# Patient Record
Sex: Female | Born: 1950 | Race: White | Hispanic: No | Smoking: Current some day smoker
Health system: Southern US, Community
[De-identification: ages and names within clinical notes are randomized; demographics above are authoritative.]

## PROBLEM LIST (undated history)

## (undated) DIAGNOSIS — J449 Chronic obstructive pulmonary disease, unspecified: Secondary | ICD-10-CM

## (undated) DIAGNOSIS — I4581 Long QT syndrome: Secondary | ICD-10-CM

## (undated) DIAGNOSIS — I639 Cerebral infarction, unspecified: Secondary | ICD-10-CM

## (undated) DIAGNOSIS — Z720 Tobacco use: Secondary | ICD-10-CM

## (undated) DIAGNOSIS — G43909 Migraine, unspecified, not intractable, without status migrainosus: Secondary | ICD-10-CM

## (undated) DIAGNOSIS — F32A Depression, unspecified: Secondary | ICD-10-CM

## (undated) DIAGNOSIS — F419 Anxiety disorder, unspecified: Secondary | ICD-10-CM

## (undated) DIAGNOSIS — T7840XA Allergy, unspecified, initial encounter: Secondary | ICD-10-CM

## (undated) DIAGNOSIS — I10 Essential (primary) hypertension: Secondary | ICD-10-CM

## (undated) DIAGNOSIS — M1710 Unilateral primary osteoarthritis, unspecified knee: Secondary | ICD-10-CM

## (undated) DIAGNOSIS — F329 Major depressive disorder, single episode, unspecified: Secondary | ICD-10-CM

## (undated) DIAGNOSIS — I2510 Atherosclerotic heart disease of native coronary artery without angina pectoris: Secondary | ICD-10-CM

## (undated) DIAGNOSIS — Z95810 Presence of automatic (implantable) cardiac defibrillator: Secondary | ICD-10-CM

## (undated) DIAGNOSIS — E785 Hyperlipidemia, unspecified: Secondary | ICD-10-CM

## (undated) DIAGNOSIS — G40909 Epilepsy, unspecified, not intractable, without status epilepticus: Secondary | ICD-10-CM

## (undated) DIAGNOSIS — I469 Cardiac arrest, cause unspecified: Secondary | ICD-10-CM

## (undated) DIAGNOSIS — R569 Unspecified convulsions: Secondary | ICD-10-CM

## (undated) DIAGNOSIS — I4891 Unspecified atrial fibrillation: Principal | ICD-10-CM

## (undated) DIAGNOSIS — I773 Arterial fibromuscular dysplasia: Secondary | ICD-10-CM

## (undated) DIAGNOSIS — I4901 Ventricular fibrillation: Secondary | ICD-10-CM

## (undated) DIAGNOSIS — I679 Cerebrovascular disease, unspecified: Secondary | ICD-10-CM

## (undated) HISTORY — DX: Presence of automatic (implantable) cardiac defibrillator: Z95.810

## (undated) HISTORY — DX: Anxiety disorder, unspecified: F41.9

## (undated) HISTORY — DX: Epilepsy, unspecified, not intractable, without status epilepticus: G40.909

## (undated) HISTORY — DX: Hyperlipidemia, unspecified: E78.5

## (undated) HISTORY — DX: Cardiac arrest, cause unspecified: I46.9

## (undated) HISTORY — DX: Chronic obstructive pulmonary disease, unspecified: J44.9

## (undated) HISTORY — DX: Migraine, unspecified, not intractable, without status migrainosus: G43.909

## (undated) HISTORY — PX: TUBAL LIGATION: SHX77

## (undated) HISTORY — DX: Unspecified convulsions: R56.9

## (undated) HISTORY — DX: Unilateral primary osteoarthritis, unspecified knee: M17.10

## (undated) HISTORY — PX: CARDIAC DEFIBRILLATOR PLACEMENT: SHX171

## (undated) HISTORY — DX: Essential (primary) hypertension: I10

## (undated) HISTORY — DX: Long QT syndrome: I45.81

## (undated) HISTORY — DX: Depression, unspecified: F32.A

## (undated) HISTORY — DX: Cerebrovascular disease, unspecified: I67.9

## (undated) HISTORY — PX: CHOLECYSTECTOMY: SHX55

## (undated) HISTORY — DX: Ventricular fibrillation: I49.01

## (undated) HISTORY — PX: APPENDECTOMY: SHX54

## (undated) HISTORY — DX: Atherosclerotic heart disease of native coronary artery without angina pectoris: I25.10

## (undated) HISTORY — DX: Arterial fibromuscular dysplasia: I77.3

## (undated) HISTORY — DX: Cerebral infarction, unspecified: I63.9

## (undated) HISTORY — DX: Tobacco use: Z72.0

## (undated) HISTORY — PX: COSMETIC SURGERY: SHX468

## (undated) HISTORY — DX: Allergy, unspecified, initial encounter: T78.40XA

## (undated) HISTORY — PX: BREAST LUMPECTOMY: SHX2

## (undated) HISTORY — DX: Major depressive disorder, single episode, unspecified: F32.9

---

## 1998-03-16 ENCOUNTER — Emergency Department (HOSPITAL_COMMUNITY): Admission: EM | Admit: 1998-03-16 | Discharge: 1998-03-16 | Payer: Self-pay | Admitting: *Deleted

## 1998-07-11 ENCOUNTER — Emergency Department (HOSPITAL_COMMUNITY): Admission: EM | Admit: 1998-07-11 | Discharge: 1998-07-11 | Payer: Self-pay | Admitting: Emergency Medicine

## 1998-07-11 ENCOUNTER — Encounter: Payer: Self-pay | Admitting: Emergency Medicine

## 1998-07-18 ENCOUNTER — Emergency Department (HOSPITAL_COMMUNITY): Admission: EM | Admit: 1998-07-18 | Discharge: 1998-07-18 | Payer: Self-pay | Admitting: Emergency Medicine

## 1998-07-19 ENCOUNTER — Emergency Department (HOSPITAL_COMMUNITY): Admission: EM | Admit: 1998-07-19 | Discharge: 1998-07-19 | Payer: Self-pay | Admitting: Emergency Medicine

## 1998-08-27 ENCOUNTER — Ambulatory Visit (HOSPITAL_COMMUNITY): Admission: RE | Admit: 1998-08-27 | Discharge: 1998-08-27 | Payer: Self-pay | Admitting: Neurosurgery

## 1998-08-27 ENCOUNTER — Encounter: Payer: Self-pay | Admitting: Neurosurgery

## 1998-09-07 ENCOUNTER — Emergency Department (HOSPITAL_COMMUNITY): Admission: EM | Admit: 1998-09-07 | Discharge: 1998-09-07 | Payer: Self-pay | Admitting: Endocrinology

## 1998-09-07 ENCOUNTER — Emergency Department (HOSPITAL_COMMUNITY): Admission: EM | Admit: 1998-09-07 | Discharge: 1998-09-07 | Payer: Self-pay | Admitting: Emergency Medicine

## 1998-10-29 ENCOUNTER — Emergency Department (HOSPITAL_COMMUNITY): Admission: EM | Admit: 1998-10-29 | Discharge: 1998-10-29 | Payer: Self-pay | Admitting: Emergency Medicine

## 1998-10-29 ENCOUNTER — Encounter: Payer: Self-pay | Admitting: Emergency Medicine

## 1998-11-16 ENCOUNTER — Emergency Department (HOSPITAL_COMMUNITY): Admission: EM | Admit: 1998-11-16 | Discharge: 1998-11-16 | Payer: Self-pay | Admitting: Emergency Medicine

## 1998-11-16 ENCOUNTER — Encounter: Payer: Self-pay | Admitting: Emergency Medicine

## 1999-02-26 ENCOUNTER — Emergency Department (HOSPITAL_COMMUNITY): Admission: EM | Admit: 1999-02-26 | Discharge: 1999-02-26 | Payer: Self-pay | Admitting: Emergency Medicine

## 1999-03-26 ENCOUNTER — Encounter: Payer: Self-pay | Admitting: Emergency Medicine

## 1999-03-26 ENCOUNTER — Emergency Department (HOSPITAL_COMMUNITY): Admission: EM | Admit: 1999-03-26 | Discharge: 1999-03-26 | Payer: Self-pay | Admitting: Emergency Medicine

## 1999-10-15 ENCOUNTER — Emergency Department (HOSPITAL_COMMUNITY): Admission: EM | Admit: 1999-10-15 | Discharge: 1999-10-15 | Payer: Self-pay | Admitting: Emergency Medicine

## 1999-10-15 ENCOUNTER — Encounter: Payer: Self-pay | Admitting: Emergency Medicine

## 1999-12-04 ENCOUNTER — Emergency Department (HOSPITAL_COMMUNITY): Admission: EM | Admit: 1999-12-04 | Discharge: 1999-12-04 | Payer: Self-pay | Admitting: Emergency Medicine

## 1999-12-17 ENCOUNTER — Emergency Department (HOSPITAL_COMMUNITY): Admission: EM | Admit: 1999-12-17 | Discharge: 1999-12-17 | Payer: Self-pay | Admitting: *Deleted

## 1999-12-23 ENCOUNTER — Emergency Department (HOSPITAL_COMMUNITY): Admission: EM | Admit: 1999-12-23 | Discharge: 1999-12-23 | Payer: Self-pay | Admitting: Internal Medicine

## 2000-02-07 ENCOUNTER — Encounter: Payer: Self-pay | Admitting: Emergency Medicine

## 2000-02-07 ENCOUNTER — Emergency Department (HOSPITAL_COMMUNITY): Admission: EM | Admit: 2000-02-07 | Discharge: 2000-02-07 | Payer: Self-pay | Admitting: Emergency Medicine

## 2000-03-28 ENCOUNTER — Emergency Department (HOSPITAL_COMMUNITY): Admission: EM | Admit: 2000-03-28 | Discharge: 2000-03-28 | Payer: Self-pay | Admitting: Emergency Medicine

## 2000-03-28 ENCOUNTER — Encounter: Payer: Self-pay | Admitting: Emergency Medicine

## 2000-05-13 ENCOUNTER — Emergency Department (HOSPITAL_COMMUNITY): Admission: EM | Admit: 2000-05-13 | Discharge: 2000-05-13 | Payer: Self-pay | Admitting: *Deleted

## 2001-01-09 ENCOUNTER — Emergency Department (HOSPITAL_COMMUNITY): Admission: EM | Admit: 2001-01-09 | Discharge: 2001-01-09 | Payer: Self-pay | Admitting: Internal Medicine

## 2001-04-01 ENCOUNTER — Encounter: Payer: Self-pay | Admitting: *Deleted

## 2001-04-02 ENCOUNTER — Inpatient Hospital Stay (HOSPITAL_COMMUNITY): Admission: EM | Admit: 2001-04-02 | Discharge: 2001-04-03 | Payer: Self-pay | Admitting: *Deleted

## 2001-04-05 ENCOUNTER — Emergency Department (HOSPITAL_COMMUNITY): Admission: EM | Admit: 2001-04-05 | Discharge: 2001-04-06 | Payer: Self-pay | Admitting: *Deleted

## 2001-04-06 ENCOUNTER — Encounter: Payer: Self-pay | Admitting: *Deleted

## 2001-09-21 ENCOUNTER — Emergency Department (HOSPITAL_COMMUNITY): Admission: EM | Admit: 2001-09-21 | Discharge: 2001-09-21 | Payer: Self-pay | Admitting: Emergency Medicine

## 2001-09-21 ENCOUNTER — Encounter: Payer: Self-pay | Admitting: Emergency Medicine

## 2001-12-24 ENCOUNTER — Emergency Department (HOSPITAL_COMMUNITY): Admission: EM | Admit: 2001-12-24 | Discharge: 2001-12-24 | Payer: Self-pay | Admitting: Emergency Medicine

## 2002-01-29 ENCOUNTER — Encounter: Payer: Self-pay | Admitting: *Deleted

## 2002-01-29 ENCOUNTER — Emergency Department (HOSPITAL_COMMUNITY): Admission: EM | Admit: 2002-01-29 | Discharge: 2002-01-29 | Payer: Self-pay | Admitting: *Deleted

## 2002-03-19 ENCOUNTER — Emergency Department (HOSPITAL_COMMUNITY): Admission: EM | Admit: 2002-03-19 | Discharge: 2002-03-19 | Payer: Self-pay | Admitting: Emergency Medicine

## 2002-07-24 ENCOUNTER — Emergency Department (HOSPITAL_COMMUNITY): Admission: EM | Admit: 2002-07-24 | Discharge: 2002-07-24 | Payer: Self-pay | Admitting: Emergency Medicine

## 2003-02-19 ENCOUNTER — Emergency Department (HOSPITAL_COMMUNITY): Admission: EM | Admit: 2003-02-19 | Discharge: 2003-02-19 | Payer: Self-pay | Admitting: Emergency Medicine

## 2003-02-19 ENCOUNTER — Encounter: Payer: Self-pay | Admitting: Emergency Medicine

## 2003-04-24 ENCOUNTER — Inpatient Hospital Stay (HOSPITAL_COMMUNITY): Admission: EM | Admit: 2003-04-24 | Discharge: 2003-05-02 | Payer: Self-pay | Admitting: Emergency Medicine

## 2003-04-24 ENCOUNTER — Encounter: Payer: Self-pay | Admitting: Emergency Medicine

## 2003-04-25 ENCOUNTER — Encounter (INDEPENDENT_AMBULATORY_CARE_PROVIDER_SITE_OTHER): Payer: Self-pay | Admitting: Cardiology

## 2003-04-25 ENCOUNTER — Encounter: Payer: Self-pay | Admitting: Neurology

## 2003-04-26 ENCOUNTER — Encounter: Payer: Self-pay | Admitting: Neurology

## 2003-05-01 ENCOUNTER — Encounter: Payer: Self-pay | Admitting: Internal Medicine

## 2003-05-01 HISTORY — PX: PACEMAKER INSERTION: SHX728

## 2003-05-02 ENCOUNTER — Encounter: Payer: Self-pay | Admitting: Neurology

## 2003-06-22 ENCOUNTER — Ambulatory Visit (HOSPITAL_COMMUNITY): Admission: RE | Admit: 2003-06-22 | Discharge: 2003-06-22 | Payer: Self-pay | Admitting: Neurology

## 2003-06-29 ENCOUNTER — Emergency Department (HOSPITAL_COMMUNITY): Admission: AD | Admit: 2003-06-29 | Discharge: 2003-06-29 | Payer: Self-pay | Admitting: Family Medicine

## 2003-07-04 ENCOUNTER — Emergency Department (HOSPITAL_COMMUNITY): Admission: AD | Admit: 2003-07-04 | Discharge: 2003-07-04 | Payer: Self-pay | Admitting: Family Medicine

## 2003-07-06 ENCOUNTER — Encounter: Payer: Self-pay | Admitting: Internal Medicine

## 2003-07-06 ENCOUNTER — Inpatient Hospital Stay (HOSPITAL_COMMUNITY): Admission: EM | Admit: 2003-07-06 | Discharge: 2003-07-07 | Payer: Self-pay | Admitting: Emergency Medicine

## 2003-07-07 ENCOUNTER — Encounter: Payer: Self-pay | Admitting: Internal Medicine

## 2003-07-11 ENCOUNTER — Emergency Department (HOSPITAL_COMMUNITY): Admission: EM | Admit: 2003-07-11 | Discharge: 2003-07-11 | Payer: Self-pay | Admitting: Emergency Medicine

## 2003-09-25 ENCOUNTER — Emergency Department (HOSPITAL_COMMUNITY): Admission: AD | Admit: 2003-09-25 | Discharge: 2003-09-25 | Payer: Self-pay | Admitting: Family Medicine

## 2004-02-19 ENCOUNTER — Emergency Department (HOSPITAL_COMMUNITY): Admission: EM | Admit: 2004-02-19 | Discharge: 2004-02-19 | Payer: Self-pay | Admitting: Emergency Medicine

## 2004-11-10 ENCOUNTER — Emergency Department (HOSPITAL_COMMUNITY): Admission: EM | Admit: 2004-11-10 | Discharge: 2004-11-10 | Payer: Self-pay | Admitting: Emergency Medicine

## 2005-07-12 ENCOUNTER — Emergency Department (HOSPITAL_COMMUNITY): Admission: EM | Admit: 2005-07-12 | Discharge: 2005-07-12 | Payer: Self-pay | Admitting: Emergency Medicine

## 2005-07-13 ENCOUNTER — Inpatient Hospital Stay (HOSPITAL_COMMUNITY): Admission: EM | Admit: 2005-07-13 | Discharge: 2005-07-14 | Payer: Self-pay | Admitting: Emergency Medicine

## 2005-11-08 ENCOUNTER — Emergency Department (HOSPITAL_COMMUNITY): Admission: EM | Admit: 2005-11-08 | Discharge: 2005-11-09 | Payer: Self-pay | Admitting: Emergency Medicine

## 2005-12-08 ENCOUNTER — Emergency Department (HOSPITAL_COMMUNITY): Admission: EM | Admit: 2005-12-08 | Discharge: 2005-12-08 | Payer: Self-pay | Admitting: Emergency Medicine

## 2005-12-25 ENCOUNTER — Emergency Department (HOSPITAL_COMMUNITY): Admission: EM | Admit: 2005-12-25 | Discharge: 2005-12-26 | Payer: Self-pay | Admitting: Emergency Medicine

## 2006-01-19 ENCOUNTER — Emergency Department (HOSPITAL_COMMUNITY): Admission: EM | Admit: 2006-01-19 | Discharge: 2006-01-19 | Payer: Self-pay | Admitting: Emergency Medicine

## 2006-04-05 ENCOUNTER — Emergency Department (HOSPITAL_COMMUNITY): Admission: EM | Admit: 2006-04-05 | Discharge: 2006-04-05 | Payer: Self-pay | Admitting: Emergency Medicine

## 2006-04-24 ENCOUNTER — Observation Stay (HOSPITAL_COMMUNITY): Admission: EM | Admit: 2006-04-24 | Discharge: 2006-04-25 | Payer: Self-pay | Admitting: Emergency Medicine

## 2006-04-24 ENCOUNTER — Ambulatory Visit: Payer: Self-pay | Admitting: *Deleted

## 2006-05-25 ENCOUNTER — Emergency Department (HOSPITAL_COMMUNITY): Admission: EM | Admit: 2006-05-25 | Discharge: 2006-05-25 | Payer: Self-pay | Admitting: Emergency Medicine

## 2006-06-07 ENCOUNTER — Emergency Department (HOSPITAL_COMMUNITY): Admission: EM | Admit: 2006-06-07 | Discharge: 2006-06-07 | Payer: Self-pay | Admitting: Emergency Medicine

## 2006-06-21 ENCOUNTER — Emergency Department (HOSPITAL_COMMUNITY): Admission: EM | Admit: 2006-06-21 | Discharge: 2006-06-22 | Payer: Self-pay | Admitting: Emergency Medicine

## 2006-09-10 ENCOUNTER — Ambulatory Visit (HOSPITAL_COMMUNITY): Admission: RE | Admit: 2006-09-10 | Discharge: 2006-09-10 | Payer: Self-pay | Admitting: Family Medicine

## 2006-11-24 ENCOUNTER — Emergency Department (HOSPITAL_COMMUNITY): Admission: EM | Admit: 2006-11-24 | Discharge: 2006-11-24 | Payer: Self-pay | Admitting: Family Medicine

## 2006-12-22 ENCOUNTER — Emergency Department (HOSPITAL_COMMUNITY): Admission: EM | Admit: 2006-12-22 | Discharge: 2006-12-23 | Payer: Self-pay | Admitting: Emergency Medicine

## 2007-09-30 ENCOUNTER — Ambulatory Visit: Payer: Self-pay | Admitting: Internal Medicine

## 2008-07-07 ENCOUNTER — Emergency Department (HOSPITAL_COMMUNITY): Admission: EM | Admit: 2008-07-07 | Discharge: 2008-07-07 | Payer: Self-pay | Admitting: Emergency Medicine

## 2008-09-27 ENCOUNTER — Ambulatory Visit: Payer: Self-pay | Admitting: Internal Medicine

## 2008-09-27 LAB — CONVERTED CEMR LAB
Cholesterol: 193 mg/dL (ref 0–200)
HDL: 35.3 mg/dL — ABNORMAL LOW (ref >39.0)
Triglycerides: 304 mg/dL (ref 0–149)

## 2008-11-30 ENCOUNTER — Encounter: Payer: Self-pay | Admitting: Internal Medicine

## 2009-01-25 ENCOUNTER — Encounter: Payer: Self-pay | Admitting: Internal Medicine

## 2009-05-22 DIAGNOSIS — Z95810 Presence of automatic (implantable) cardiac defibrillator: Secondary | ICD-10-CM | POA: Insufficient documentation

## 2009-05-22 DIAGNOSIS — I679 Cerebrovascular disease, unspecified: Secondary | ICD-10-CM | POA: Insufficient documentation

## 2009-05-22 DIAGNOSIS — J449 Chronic obstructive pulmonary disease, unspecified: Secondary | ICD-10-CM

## 2009-05-22 DIAGNOSIS — F17200 Nicotine dependence, unspecified, uncomplicated: Secondary | ICD-10-CM | POA: Insufficient documentation

## 2009-05-22 DIAGNOSIS — F411 Generalized anxiety disorder: Secondary | ICD-10-CM

## 2009-05-22 DIAGNOSIS — R569 Unspecified convulsions: Secondary | ICD-10-CM

## 2009-05-22 DIAGNOSIS — G43909 Migraine, unspecified, not intractable, without status migrainosus: Secondary | ICD-10-CM | POA: Insufficient documentation

## 2009-05-22 DIAGNOSIS — I2510 Atherosclerotic heart disease of native coronary artery without angina pectoris: Secondary | ICD-10-CM | POA: Insufficient documentation

## 2009-05-22 DIAGNOSIS — J4489 Other specified chronic obstructive pulmonary disease: Secondary | ICD-10-CM | POA: Insufficient documentation

## 2009-05-22 DIAGNOSIS — I469 Cardiac arrest, cause unspecified: Secondary | ICD-10-CM | POA: Insufficient documentation

## 2009-05-22 DIAGNOSIS — I4581 Long QT syndrome: Secondary | ICD-10-CM

## 2009-05-22 DIAGNOSIS — M179 Osteoarthritis of knee, unspecified: Secondary | ICD-10-CM | POA: Insufficient documentation

## 2009-05-22 DIAGNOSIS — M1710 Unilateral primary osteoarthritis, unspecified knee: Secondary | ICD-10-CM

## 2009-05-22 DIAGNOSIS — F329 Major depressive disorder, single episode, unspecified: Secondary | ICD-10-CM

## 2009-06-26 ENCOUNTER — Encounter (INDEPENDENT_AMBULATORY_CARE_PROVIDER_SITE_OTHER): Payer: Self-pay | Admitting: *Deleted

## 2009-07-13 ENCOUNTER — Encounter: Payer: Self-pay | Admitting: Internal Medicine

## 2009-07-13 ENCOUNTER — Telehealth: Payer: Self-pay | Admitting: Internal Medicine

## 2009-07-16 ENCOUNTER — Telehealth: Payer: Self-pay | Admitting: Internal Medicine

## 2010-02-20 ENCOUNTER — Ambulatory Visit: Payer: Self-pay | Admitting: Internal Medicine

## 2010-02-21 ENCOUNTER — Encounter: Payer: Self-pay | Admitting: Internal Medicine

## 2010-03-19 ENCOUNTER — Emergency Department (HOSPITAL_COMMUNITY): Admission: EM | Admit: 2010-03-19 | Discharge: 2010-03-20 | Payer: Self-pay | Admitting: Emergency Medicine

## 2010-05-09 ENCOUNTER — Telehealth (INDEPENDENT_AMBULATORY_CARE_PROVIDER_SITE_OTHER): Payer: Self-pay | Admitting: *Deleted

## 2010-06-26 ENCOUNTER — Ambulatory Visit: Payer: Self-pay | Admitting: Internal Medicine

## 2010-06-27 ENCOUNTER — Encounter: Payer: Self-pay | Admitting: Internal Medicine

## 2010-08-13 ENCOUNTER — Telehealth (INDEPENDENT_AMBULATORY_CARE_PROVIDER_SITE_OTHER): Payer: Self-pay

## 2010-09-04 ENCOUNTER — Telehealth (INDEPENDENT_AMBULATORY_CARE_PROVIDER_SITE_OTHER): Payer: Self-pay | Admitting: *Deleted

## 2010-10-13 ENCOUNTER — Encounter: Payer: Self-pay | Admitting: Obstetrics and Gynecology

## 2010-10-24 NOTE — Progress Notes (Signed)
Summary: Question concerning lightheadedness   Phone Note Call from Patient Call back at Home Phone [phone]   Caller: Patient Reason for Call: Talk to Doctor Summary of Call: I returned a call from the patient concerning lightheadedness and palpitations.  She states that when she stands up she becomes lightheaded and has actually passed out one time recently.  She has an ICD from previous cardiac arrest, this has not shocked her and has been interrogated in the recent past and is not at end of life.  She denies any prodromal symptoms and does not have any lost of bowels, or altered consciousness following the episode.  She does say that once she stands for a moment she feels better.  She states that she is feeling fine otherwise, no fever/chills or CP/SOB.  She does have occasional palpitations, that are unchanged from prior history.  Currently she says her heart is not beating fast.  I have told the patient she may try halving her metroprolol dose and see if this helps.  I have also instructed her to sit on the edge of the chair for a minute prior to standing and then again wait before starting to walk.  She voiced understanding.  She says she has an appt with [facility] in 2 weeks and will be sure to keep this appt.  If she continues to have these symptoms or they worsen she has been instructed to go to the emergency department, she voiced understanding.  The patient appreciated the call back. Initial call taken by: Robbi Garter NP-PA,  May 09, 2010 7:14 PM

## 2010-10-24 NOTE — Letter (Signed)
Summary: Operative Report, ICD Follow Up, ICD Implant Data Form/Warranty   Operative Report, ICD Follow Up, ICD Implant Data Form/Warranty 2004   Imported By: Roderic Ovens 07/25/2010 12:18:56  _____________________________________________________________________  External Attachment:    Type:   Image     Comment:   External Document

## 2010-10-24 NOTE — Assessment & Plan Note (Signed)
Summary: 49month/rm  Medications Added VALIUM 5 MG TABS (DIAZEPAM) take 1/2 tab qid        Visit Type:  Follow-up Primary Provider:  no primary m.d  CC:  no cardiology complaints.  History of Present Illness: [name] returns today for followup.  She is a very pleasant middle-aged woman with a history of VF arrest status post ICD insertion.  She has a history of VT.  She has a history of tachy palpitations, who returns today for followup.  She has had some palpitations over the past few months and has documented atrial fibrillation.  She denies c/p or syncope or any ICD shocks.  She has been placed on ASA.  Current Medications (verified): 1)  Metoprolol Succinate 100 Mg Xr24h-Tab (Metoprolol Succinate) .... Once Daily 2)  Simvastatin 20 Mg Tabs (Simvastatin) .... Take One Tablet By Mouth Daily At Bedtime 3)  Paxil 30 Mg Tabs (Paroxetine Hcl) .... 2 By Mouth Once Daily 4)  Valium 5 Mg Tabs (Diazepam) .... Take 1/2 Tab Qid  Allergies: 1)  ! Codeine 2)  ! * Myacin 3)  ! 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 Ladona Ridgel, M.D.    Review of Systems  The patient denies chest pain, syncope, dyspnea on exertion, and  peripheral edema.    Vital Signs:  Patient profile:   60 year old female Weight:      191 pounds Pulse rate:   77 / minute BP sitting:   134 / 83  (right arm)  Vitals Entered By: Dreama Saa, CNA (June 26, 2010 2:40 PM)  Physical Exam  General:  Middle aged well developed, well nourished, in no acute distress.  HEENT: normal Neck: supple. No JVD. Carotids 2+ bilaterally no bruits Cor: RRR no rubs, gallops or murmur Lungs: CTA. well healed ICD incision. Ab: soft, nontender. nondistended. No HSM. Good bowel sounds Ext: warm. no cyanosis, clubbing or edema Neuro: alert and oriented. Grossly nonfocal. affect pleasant     ICD Specifications Following MD:  Lewayne Bunting, MD     ICD Vendor:  [employer] [facility] [facility] [employer]     ICD Model Number:  [phone]     ICD Serial Number:  [id number] ICD DOI:  05/01/2003     ICD Implanting MD:  Lewayne Bunting, MD  Lead 1:    Location: RV     DOI: 05/01/2003     Model #: [id number]     Serial #: [id number]     Status: active  Indications::  LONG QT SYNDROME   ICD Follow Up Remote Check?  No Battery Voltage:  2.58 V     Charge Time:  15.1 seconds     Battery Est. Longevity:  MOL2 Underlying rhythm:  SR ICD Dependent:  No       ICD Device Measurements Right Ventricle:  Amplitude: 13.1 mV, Impedance: 995 ohms, Threshold: 0.8 V at 0.5 msec Shock Impedance: 49  ohms   Episodes Coumadin:  No Shock:  0     ATP:  0     Nonsustained:  1     Ventricular Pacing:  0%  Brady Parameters Mode VVI     Lower Rate Limit:  40      Tachy Zones VF:  210     VT:  170     Next Cardiology Appt Due:  09/22/2010 Tech Comments:  No parameter changes.  Device function normal.  C/O episode in June where she was seen in the ED for rapid heart rate.  The device showed one NSVT episode 14 seconds.  ROV 3 months [facility] clinic. Altha Harm, LPN  June 26, 2010 3:01 PM  MD Comments:  Agree with above.  Impression & Recommendations:  Problem # 1:  AUTOMATIC IMPLANTABLE CARDIAC DEFIBRILLATOR  SITU (ICD-V45.02) Her device is working normally.  Will recheck her device in several months.  Problem # 2:  CAD (ICD-414.00) She denies anginal symptoms.  Continue meds as below. Her updated medication list for this problem includes:    Metoprolol Succinate 100 Mg Xr24h-tab (Metoprolol succinate) ..... 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  She has been stable. Her updated medication list for this problem includes:    Metoprolol Succinate 100 Mg Xr24h-tab (Metoprolol succinate) ..... Once daily  Patient Instructions: 1)  Your physician recommends that you schedule a follow-up appointment in: 1 year

## 2010-10-24 NOTE — Assessment & Plan Note (Signed)
Summary: ROV  Medications Added SIMVASTATIN 20 MG TABS (SIMVASTATIN) Take one tablet by mouth daily at bedtime VALIUM 5 MG TABS (DIAZEPAM) take 1/2 tab as needed      Allergies Added: ! CODEINE ! Juleen China ! DARVOCET  Visit Type:  Follow-up Primary Provider:  no primary m.d   History of Present Illness: [name] returns today for followup.  She is a very pleasant middle-aged woman with a history of VF arrest status post ICD insertion.  She has a history of VT.  She has a history of tachy palpitations, who returns today for followup.  The patient has a host of different complaints today including problems with wanted to stop smoking, but been unable; problems with occasional headaches, and problems with fatigue. She has also had worsening arthritic complaints and is considering knee surgery.  She denies c/p or syncope or any ICD shocks.  Current Medications (verified): 1)  Metoprolol Succinate 100 Mg Xr24h-Tab (Metoprolol Succinate) .... Once Daily 2)  Simvastatin 20 Mg Tabs (Simvastatin) .... Take One Tablet By Mouth Daily At Bedtime 3)  Xanax 0.5 Mg Tabs (Alprazolam) .Marland Kitchen.. 1 By Mouth Q4 To  6 As Needed 4)  Paxil 30 Mg Tabs (Paroxetine Hcl) .... 2 By Mouth Once Daily 5)  Valium 5 Mg Tabs (Diazepam) .... Take 1/2 Tab As Needed  Allergies (verified): 1)  ! Codeine 2)  ! * Myacin 3)  ! 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 Ladona Ridgel,  M.D.    Review of Systems  The patient denies chest pain, syncope, dyspnea on exertion, and peripheral edema.    Vital Signs:  Patient profile:   61 year old female Height:      64 inches Weight:      194 pounds BMI:     33.42 Pulse rate:   79 / minute BP sitting:   140 / 87  (right arm)  Vitals Entered By: Dreama Saa, CNA (February 20, 2010 4:34 PM)  Physical Exam  General:  Middle aged well developed, well nourished, in no acute distress.  HEENT: normal Neck: supple. No JVD. Carotids 2+ bilaterally no bruits Cor: RRR no rubs, gallops or murmur Lungs: CTA. well healed ICD incision. Ab: soft, nontender. nondistended. No HSM. Good bowel sounds Ext: warm. no cyanosis, clubbing or edema Neuro: alert and oriented. Grossly nonfocal. affect pleasant     ICD Specifications Following MD:  Lewayne Bunting, MD     ICD Vendor:  [facility] [employer]     ICD Model Number:  [id number]     ICD Serial Number:  [id number] ICD DOI:  05/01/2003     ICD Implanting MD:  Lewayne Bunting, MD  Lead 1:    Location: RV     DOI: 05/01/2003     Model #: [id number]     Serial #: [id number]     Status: active  Indications::  LONG QT SYNDROME   ICD Follow Up Remote Check?  No Battery Voltage:  2.58 V     Charge Time:  14.8 seconds     Battery Est. Longevity:  MOL2 Underlying rhythm:  SR ICD Dependent:  No       ICD Device Measurements Right Ventricle:  Amplitude: 14.9 mV, Impedance: 1052 ohms, Threshold: 0.8 V at 0.5 msec Shock Impedance: 49 ohms   Episodes Shock:  0     ATP:  0     Nonsustained:  0     Ventricular Pacing:  0  Brady Parameters Mode VVI     Lower Rate Limit:  40      Tachy Zones VF:  210     VT:  170     Next Cardiology Appt Due:  05/23/2010 Tech Comments:  No parameter changes.  Device function normal.  No Latitude @ this time.  ROV 3 months [facility] clinic. Altha Harm, LPN  February 20, 453 4:43 PM n  Impression & Recommendations:  Problem # 1:  AUTOMATIC IMPLANTABLE CARDIAC DEFIBRILLATOR SITU  (ICD-V45.02) Her device is working normally and has still not reached ERI.  Will recheck in several months.  Problem # 2:  TOBACCO ABUSE (ICD-305.1) I discussed the importance of stopping smoking and she states that she is going to try.  Problem # 3:  LONG QT SYNDROME (ICD-426.82) She has had no recurrent symptoms or any intercurrent ICD therapies. Her updated medication list for this problem includes:    Metoprolol Succinate 100 Mg Xr24h-tab (Metoprolol succinate) ..... Once daily  Problem # 4:  COPD (ICD-496) This is a recent diagnosis and she is stable on  bronchodilators. Prescriptions: SIMVASTATIN 20 MG TABS (SIMVASTATIN) Take one tablet by mouth daily at bedtime  #90 x 1   Entered by:   Larita Fife Via LPN   Authorized by:   Laren Boom, MD, [facility]   Signed by:   Larita Fife Via LPN on 09/81/1914   Method used:   Electronically to        [facility] (retail)       [address]       [place], [place]  [address]       Ph: [phone]       Fax: [phone]   RxID:   [id number]

## 2010-10-24 NOTE — Progress Notes (Signed)
Summary: Refill   Phone Note Call from Patient   Caller: Patient Reason for Call: Refill Medication Summary of Call: patient states that she is taking Valium for Tachycardia / states that she can not get in touch with [facility] so she wants to know if we can call it in / tg Initial call taken by: Raechel Ache [facility],  August 13, 2010 3:43 PM  Follow-up for Phone Call        Request denied. Pt. advised to f/u with [facility]. Follow-up by: Larita Fife Via LPN,  August 13, 2010 5:05 PM

## 2010-10-24 NOTE — Progress Notes (Signed)
Summary: pt would like [place] to fill her rx for valium   Phone Note Call from Patient Call back at Home Phone [phone]   Caller: pt Reason for Call: Talk to Nurse Summary of Call: pt is calling us to see if we can call in valium for her this one time cannot get her psychiaprist to fill for her because she has not seen him in a long time and they will not fill till she comes in to be seen. Initial call taken by: Faythe Ghee,  September 04, 2010 3:01 PM  Follow-up for Phone Call        [facility] we will not fill this med Follow-up by: Teressa Lower RN,  September 04, 2010 4:55 PM

## 2010-10-24 NOTE — Cardiovascular Report (Signed)
Summary: [place] ICD Follow-Up Summary   [place] ICD Follow-Up Summary   Imported By: Roderic Ovens 07/25/2010 12:00:31  _____________________________________________________________________  External Attachment:    Type:   Image     Comment:   External Document

## 2010-12-09 LAB — URINALYSIS, ROUTINE W REFLEX MICROSCOPIC
Bilirubin Urine: NEGATIVE
Nitrite: NEGATIVE
Protein, ur: NEGATIVE mg/dL
Specific Gravity, Urine: 1.006 (ref 1.005–1.030)
Urobilinogen, UA: 0.2 mg/dL (ref 0.0–1.0)

## 2010-12-09 LAB — CBC
MCHC: 34.1 g/dL (ref 30.0–36.0)
Platelets: 228 10*3/uL (ref 150–400)
RDW: 13.2 % (ref 11.5–15.5)

## 2010-12-09 LAB — URINE MICROSCOPIC-ADD ON

## 2010-12-09 LAB — DIFFERENTIAL
Basophils Absolute: 0 10*3/uL (ref 0.0–0.1)
Eosinophils Absolute: 0.3 10*3/uL (ref 0.0–0.7)
Lymphocytes Relative: 26 % (ref 12–46)
Lymphs Abs: 2.6 10*3/uL (ref 0.7–4.0)
Neutrophils Relative %: 63 % (ref 43–77)

## 2010-12-09 LAB — POCT CARDIAC MARKERS
CKMB, poc: <1 ng/mL — ABNORMAL LOW (ref 1.0–8.0)
CKMB, poc: <1 ng/mL — ABNORMAL LOW (ref 1.0–8.0)
Troponin i, poc: <0.05 ng/mL (ref 0.00–0.09)
Troponin i, poc: <0.05 ng/mL (ref 0.00–0.09)

## 2010-12-09 LAB — COMPREHENSIVE METABOLIC PANEL
Albumin: 3.5 g/dL (ref 3.5–5.2)
BUN: 9 mg/dL (ref 6–23)
Calcium: 9.6 mg/dL (ref 8.4–10.5)
Creatinine, Ser: 1.35 mg/dL — ABNORMAL HIGH (ref 0.4–1.2)
Potassium: 3.3 mEq/L — ABNORMAL LOW (ref 3.5–5.1)
Total Protein: 6.6 g/dL (ref 6.0–8.3)

## 2011-01-16 ENCOUNTER — Emergency Department (HOSPITAL_COMMUNITY)
Admission: EM | Admit: 2011-01-16 | Discharge: 2011-01-16 | Disposition: A | Discharge status: Home / Self Care | Payer: Medicaid Other | Attending: Emergency Medicine | Admitting: Emergency Medicine

## 2011-01-16 ENCOUNTER — Emergency Department (HOSPITAL_COMMUNITY): Payer: Medicaid Other

## 2011-01-16 DIAGNOSIS — J069 Acute upper respiratory infection, unspecified: Secondary | ICD-10-CM | POA: Insufficient documentation

## 2011-01-16 DIAGNOSIS — I10 Essential (primary) hypertension: Secondary | ICD-10-CM | POA: Insufficient documentation

## 2011-01-16 DIAGNOSIS — I4891 Unspecified atrial fibrillation: Secondary | ICD-10-CM | POA: Insufficient documentation

## 2011-01-16 DIAGNOSIS — R0609 Other forms of dyspnea: Secondary | ICD-10-CM | POA: Insufficient documentation

## 2011-01-16 DIAGNOSIS — R0989 Other specified symptoms and signs involving the circulatory and respiratory systems: Secondary | ICD-10-CM | POA: Insufficient documentation

## 2011-01-16 DIAGNOSIS — Z95810 Presence of automatic (implantable) cardiac defibrillator: Secondary | ICD-10-CM | POA: Insufficient documentation

## 2011-01-16 DIAGNOSIS — R05 Cough: Secondary | ICD-10-CM | POA: Insufficient documentation

## 2011-01-16 DIAGNOSIS — R059 Cough, unspecified: Secondary | ICD-10-CM | POA: Insufficient documentation

## 2011-01-16 LAB — CBC
Hemoglobin: 13.9 g/dL (ref 12.0–15.0)
MCH: 32.5 pg (ref 26.0–34.0)
MCHC: 34.9 g/dL (ref 30.0–36.0)
MCV: 93 fL (ref 78.0–100.0)

## 2011-01-16 LAB — POCT I-STAT, CHEM 8
Calcium, Ion: 1.06 mmol/L — ABNORMAL LOW (ref 1.12–1.32)
Creatinine, Ser: 1.2 mg/dL (ref 0.4–1.2)
Glucose, Bld: 111 mg/dL — ABNORMAL HIGH (ref 70–99)
HCT: 41 % (ref 36.0–46.0)
Hemoglobin: 13.9 g/dL (ref 12.0–15.0)
Potassium: 3.5 mEq/L (ref 3.5–5.1)
Sodium: 138 mEq/L (ref 135–145)

## 2011-01-16 LAB — POCT CARDIAC MARKERS: Troponin i, poc: <0.05 ng/mL (ref 0.00–0.09)

## 2011-01-16 LAB — DIFFERENTIAL
Basophils Relative: 1 % (ref 0–1)
Lymphs Abs: 1.7 10*3/uL (ref 0.7–4.0)
Monocytes Absolute: 0.7 10*3/uL (ref 0.1–1.0)
Monocytes Relative: 8 % (ref 3–12)
Neutro Abs: 5.5 10*3/uL (ref 1.7–7.7)

## 2011-01-16 LAB — PROTIME-INR: Prothrombin Time: 13.8 seconds (ref 11.6–15.2)

## 2011-01-18 ENCOUNTER — Telehealth: Payer: Self-pay | Admitting: Physician Assistant

## 2011-01-18 NOTE — Telephone Encounter (Signed)
[name] was in ER last PM with URI Sx and heart out of rhythm. HR was 130 by ER records on admit but SBP dropped 80s, ECG not available. Pt rec'd prednisone 60mg  in ER and has a prescription but has not picked it up. She took Toprol-XL 100mg  1/2 tab this pm (usually takes at bedtime) and HR now approx 80.

## 2011-01-20 ENCOUNTER — Telehealth: Payer: Self-pay

## 2011-01-20 NOTE — Telephone Encounter (Signed)
Per [name] pt needs call re a-fib treatment -pls call

## 2011-01-21 ENCOUNTER — Telehealth: Payer: Self-pay

## 2011-01-27 ENCOUNTER — Encounter: Payer: Medicaid Other | Admitting: *Deleted

## 2011-02-03 ENCOUNTER — Ambulatory Visit (INDEPENDENT_AMBULATORY_CARE_PROVIDER_SITE_OTHER): Payer: Medicaid Other | Admitting: Adult Health

## 2011-02-03 ENCOUNTER — Encounter: Payer: Self-pay | Admitting: Adult Health

## 2011-02-03 DIAGNOSIS — F411 Generalized anxiety disorder: Secondary | ICD-10-CM

## 2011-02-03 DIAGNOSIS — Z95810 Presence of automatic (implantable) cardiac defibrillator: Secondary | ICD-10-CM

## 2011-02-03 DIAGNOSIS — I2510 Atherosclerotic heart disease of native coronary artery without angina pectoris: Secondary | ICD-10-CM

## 2011-02-03 DIAGNOSIS — J449 Chronic obstructive pulmonary disease, unspecified: Secondary | ICD-10-CM

## 2011-02-03 MED ORDER — DIAZEPAM 10 MG PO TABS: 10.0000 mg | ORAL_TABLET | ORAL | Status: DC | PRN

## 2011-02-03 NOTE — Patient Instructions (Signed)
Your physician recommends that you continue on your current medications as directed. Please refer to the Current Medication list given to you today.   Your physician recommends that you schedule a follow-up appointment in: 2 months at the [facility]

## 2011-02-04 ENCOUNTER — Encounter: Payer: Self-pay | Admitting: Adult Health

## 2011-02-04 NOTE — Assessment & Plan Note (Signed)
[facility]                         ELECTROPHYSIOLOGY OFFICE NOTE   NAME:[name]                    MRN:          [id number]  DATE:09/27/2008                            DOB:          1950-12-18    [name] returns today for followup.  She is a very pleasant middle-  aged woman with a history of VF arrest status post ICD insertion.  She  has a history of VT.  She has a history of tachy palpitations, who  returns today for followup.  The patient has a host of different  complaints today including problems with wanted to stop smoking, but  been unable; problems with occasional headaches, and problems with  fatigue.   MEDICATIONS:  Today include;  1. Zocor 20 a day.  2. Toprol-XL 100 a day.  3. She is also on Paxil 40 a day.   PHYSICAL EXAMINATION:  GENERAL:  She is a pleasant middle-aged woman in  no distress.  VITAL SIGNS:  Blood pressure was 160/100, the pulse was 64 and regular,  respirations were 18, and the weight was 193 pounds.  HEENT:  Normocephalic and atraumatic.  Pupils are equal and round.  Oropharynx  is moist.  Sclerae anicteric.  NECK:  No jugular venous distention.  LUNGS:  Clear bilaterally to auscultation.  No wheezes, rales, or  rhonchi are present.  No increased work of breathing.  CARDIOVASCULAR:  Regular rate and rhythm.  Normal S1 and S2.  ABDOMEN:  Soft, nontender, and nondistended.  No organomegaly.  EXTREMITIES:  No edema.   IMPRESSION:  1. History of ventricular fibrillation arrest.  2. Ventricular tachycardia.  3. Ongoing tobacco abuse.  4. Hypertension.   DISCUSSION:  The patient is stable at the present time.  I have  discussed the importance of stopping smoking.  I have asked her to  follow back up with Dr. [name], who is her primary doctor, and  perhaps be started on Chantix.  I will plan to see the patient back in  the office in 1 year.   Her defibrillator today checks out normally.  Her  Guidant Prizm device  has been in place 6-1/2 years.  R waves were 15.  The threshold 0.8 at  0.5.  The battery voltage was 2.61 ( MOL2) with no intercurrent ICD  therapies.     [name]. Brittney Ridgel, MD  Electronically Signed    GWT/MedQ  DD: 09/27/2008  DT: 09/28/2008  Job #: [id number]   cc:   [name], M.D.

## 2011-02-04 NOTE — Assessment & Plan Note (Signed)
She was treated in ER on 01/16/2011 for Dyspnea and this was relieved with Xopenex treatment.  She was in Afib RVR and pacemaker was interrogated finding it to be fx appropriately. No report is available to review.  Since that time she has been doing well.  She feels that the symptoms were related to stress and anxiety.  ER records confirm no MI, PE, or CHF.  No changes at this time in medications.

## 2011-02-04 NOTE — Progress Notes (Signed)
HPI: [name] is an anxious 60 y/o obese CF pt of Dr. Lewayne Bunting we are seeing on follow up after ER admission for dyspnea. She has a histroy of mild CAD without major flow limiting disease per cath in 2004, VFib arrest s/p AICD pacemaker, atrial fib not on coumadin (CHADS 1). hypercholesterolemia, and ongoing tobacco abuse.  She in ER visit she was ruled out for MI, CHF and PE. She was given Xopenex tx, and RX for Advair inhaler and valium.  She is here today without complaints. She has had no DOE, chest pain or palpitations.  HR in ER was found to be 128bpm. Once breathing treatment was completed, HR normalized.  She was also diagnosed with anxiety.  She is currently getting ready to move to [facility] in 1 week as she is living with son and daughter-in-law causing her a lot of stress.  Allergies  Allergen Reactions  . Codeine   . Propoxyphene N-Acetaminophen     Current Outpatient Prescriptions  Medication Sig Dispense Refill  . albuterol (VENTOLIN HFA) 108 (90 BASE) MCG/ACT inhaler Inhale 2 puffs into the lungs every 6 (six) hours as needed.        Marland Kitchen aspirin 81 MG tablet Take 81 mg by mouth daily.        Marland Kitchen atorvastatin (LIPITOR) 40 MG tablet Take 40 mg by mouth daily. Take 1/2 tab daily       . diphenhydrAMINE (BENADRYL) 25 MG tablet Take 25 mg by mouth every 6 (six) hours as needed.        . Fluticasone-Salmeterol (ADVAIR DISKUS IN) Inhale into the lungs.        Marland Kitchen METOPROLOL TARTRATE PO Take 1 tablet by mouth daily.       Marland Kitchen PAROXETINE HCL PO Take 1 tablet by mouth 2 (two) times daily.       . diazepam (VALIUM) 10 MG tablet Take 1 tablet (10 mg total) by mouth as needed for anxiety.  30 tablet  0    Past Medical History  Diagnosis Date  . Ventricular fibrillation   . Coronary artery disease     non obstructive  . Stroke   . Seizures   . Anxiety   . Depression   . Migraine   . Fibromuscular dysplasia   . Tobacco abuse     Past Surgical History  Procedure Date  . Pacemaker  insertion     ICD    ZOX:WRUEAV of systems complete and found to be negative unless listed above PHYSICAL EXAM BP 134/85  Pulse 83  Ht 5\' 4"  (1.626 m)  Wt 185 lb (83.915 kg)  BMI 31.76 kg/m2  SpO2 92% General: Well developed, well nourished, in no acute distress Head: Eyes PERRLA, No xanthomas.   Normal cephalic and atramatic  Lungs: Mild crackles without wheezes.  Prolonged expiratory effort. Heart: HRRR S1 S2,.  Pulses are 2+ & equal.            No carotid bruit. No JVD.  No abdominal bruits. No femoral bruits. Abdomen: Bowel sounds are positive, abdomen soft and non-tender without masses or                  Hernia's noted. Msk:  Back normal, normal gait. Normal strength and tone for age. Extremities: No clubbing, cyanosis or edema.  DP +1 Neuro: Alert and oriented X 3. Psych:  Good affect, anxious, responds appropriately

## 2011-02-04 NOTE — Assessment & Plan Note (Signed)
I have refilled her valium but only for a 2 week supply with no refills.. She will need to find PMD in [place].  She plans to see her daughter;s physician when she moves.

## 2011-02-04 NOTE — Assessment & Plan Note (Signed)
[facility]                                 ON-CALL NOTE   SOLVEIG, FANGMAN                      MRN:          [id number]  DATE:07/07/2008                            DOB:          06/27/1951    PRIMARY CARDIOLOGIST:  Doylene Canning. Ladona Ridgel, MD   Ms. Pickler called today from the emergency room.  She stated that she  had run out of her beta-blocker and was having tachycardia today with  heart rates up into the 160s, associated with some chest pressure.  She  has a history of V-fib arrest and implantable cardiac defibrillator.  She stated that she had contacted her pharmacy and the pharmacy had  contacted our office but they had not heard back.  She stated she had  made an appointment with [place] for November 13, but had not yet been seen.  She requested a refill of her medication.  She was unsure of the dosage  or the type of metoprolol.   There is no current record of her medication list.  Her last office  visit with Dr. Ladona Ridgel was in January.  I tried to contact the pharmacy  but it was after 9 o'clock and they were closed.  I advised Ms. Colclasure  to try and get a prescription for one month's worth of her beta-blocker  from the ER physician when she saw him.  I asked that if she was not  able to get a whole month's worth but could get a few days to contact  our office on Monday to try to get the rest of it, so she would not be  off her medication until she came in.  I encouraged her to stay in the  emergency room until she was seen by the doctor because she was thinking  about leaving AMA.      Theodore Demark, PA-C  Electronically Signed      Marca Ancona, MD  Electronically Signed   RB/MedQ  DD: 07/07/2008  DT: 07/08/2008  Job #: [id number]

## 2011-02-04 NOTE — Assessment & Plan Note (Signed)
[facility]                         ELECTROPHYSIOLOGY OFFICE NOTE   NAME:Kann, LYNNOX GIRTEN                    MRN:          [id number]  DATE:09/30/2007                            DOB:          Feb 18, 1951    Ms. Pell returns today for followup.  She is a very pleasant 60-year-  old woman with a host of medical problems and a history of her VF  arrest, status post ICD insertion, who returns today for follow-up.  In  the interim, the patient notes that she has had increasing dyspnea.  She  also has problems with arthritis in her hips and knees and is  considering having knee surgery for this.  She also has a history of  chronic to tobacco abuse and obesity.  Today, her complaints were many.  I note that she has been considering having her teeth removed, which are  severely carious.   PHYSICAL EXAM:  GENERAL:  She is a pleasant middle-aged woman in no  acute distress.  She looks somewhat older than her stated age.  VITAL SIGNS:  Blood pressure was 140/90 and on re-check 130/90.  The  pulse was 67 and regular, respirations were 18.  The weight was 187  pounds.  NECK:  Revealed no jugular venous distention.  LUNGS:  Clear bilaterally to auscultation.  No wheezes, rales or rhonchi  are present.  CARDIOVASCULAR:  Exam revealed regular rate and rhythm with normal S1  and S2.  EXTREMITIES:  Demonstrated no edema.  Interrogation of her defibrillator  demonstrates a Guidant Prism II with R waves of 18, the impedance 826,  the threshold a volt of 0.5, battery voltage was 2.74 volts.  Her  devices was 4-1/2 half years, since implant.  Today, returned her  outputs down in the ventricle to 2.4.   IMPRESSION:  1. Status post ventricular fibrillation arrest.  2. Status post implantable cardiac defibrillator insertion.  3. Hypertension.  4. Obesity.  5. Severe arthritis.  6. Severe dental caries, in need of teeth extraction.   DISCUSSION:  With Mrs.  Tift problem with increasing dyspnea with  exertion, I have recommended that she undergo a stress Myoview study  prior to her teeth extraction and prior to any orthopedic procedures  requiring general anesthesia.  This will be scheduled at the earliest  convenient time.  Will see her back following this.     Doylene Canning. Ladona Ridgel, MD  Electronically Signed    GWT/MedQ  DD: 09/30/2007  DT: 09/30/2007  Job #: [id number]

## 2011-02-04 NOTE — Assessment & Plan Note (Signed)
Currently asymptomatic.  Will defer further testing to Dr. Mariah Milling as she will be established with him when she moves next week.  No changes at this time to her medications or treatment regimen.

## 2011-02-04 NOTE — Assessment & Plan Note (Signed)
She is moving to [place] in one week. Pacemaker clinic visit there with Dr. Johney Frame will be scheduled for continued evaluation and treatment if necessary.

## 2011-02-07 NOTE — Op Note (Signed)
NAMEMAKELLE, MARRONE                         ACCOUNT NO.:  [id number]   MEDICAL RECORD NO.:  [id number]                   PATIENT TYPE:  INP   LOCATION:  2028                                 FACILITY:  [facility]   PHYSICIAN:  Doylene Canning. Ladona Ridgel, M.D.               DATE OF BIRTH:  1951-08-18   DATE OF PROCEDURE:  05/01/2003  DATE OF DISCHARGE:                                 OPERATIVE REPORT   PROCEDURE PERFORMED:  Implantation of a single-chamber implantable  cardioverter-defibrillator.   INDICATIONS:  Spontaneous sustained ventricular fibrillation due to long QT  syndrome.   HISTORY OF PRESENT ILLNESS:  The patient is a 60 year old woman who was  admitted to the hospital with a stroke.  On telemetry she spontaneously  developed ventricular fibrillation and was successfully resuscitated.  She  was found to have long QT syndrome and was on no drugs to provoke her  arrest.  It was not bradycardic-dependent.  She is now referred for ICD  insertion secondary to resuscitated VF arrest.   DESCRIPTION OF PROCEDURE:  After informed consent was obtained the patient  was taken to the diagnostic EP lab in the fasted state.  After the usual  preparation and draping, intravenous fentanyl and midazolam were given for  sedation.  A total of 30 mL of lidocaine was infiltrated into the left  infraclavicular region.  A 9 cm incision was carried out over this region  and electrocautery utilized to dissect down to the fascial plane.  Ten  milliliters of contrast injected into the left upper extremity venous system  demonstrated a patent left subclavian vein.  It was subsequently punctured  and the Guidant model [id number] defibrillation lead was advanced into the  right ventricle.  At the final site in the RV apex the R-waves measured 16  millivolts and the pacing threshold once the lead was actively fixed was 0.2  volts at 0.5 msec.  Ten-volt pacing demonstrated no diaphragmatic  stimulation.  The  pacing impedance was 660 Ohms.  With the lead in  satisfactory position it was secured to the subpectoralis fascia with a  figure-of-eight silk suture.  In addition, the sewing sleeve was secured  with silk suture.  Electrocautery was then utilized to make a subcutaneous  pocket.  Kanamycin irrigation was utilized to irrigate the pocket and  electrocautery utilized to assure hemostasis.  The Guidant Prizm II-VR AICD,  serial number [id number], single-chamber defibrillator was then connected to the  defibrillation lead and placed in the subcutaneous pocket.  It was secured  with a silk suture.  Additional kanamycin was utilized to irrigate the  incision and defibrillation threshold testing carried out.   After the patient was more deeply sedated with large amounts of fentanyl,  Versed, and Valium, DFT testing was carried out.  VF was induced with a T-  wave shock, and an 11-joule shock terminated ventricular fibrillation,  restoring sinus rhythm.  Five minutes were allowed to elapse and a second  DFT test carried out.  Again VF was induced with a T-wave shock and VF  terminated with another 11-joule shock.  At this point no defibrillation  threshold testing was carried out, and the incision was closed with a layer  of 2-0 Vicryl, followed by a layer of 3-0 Vicryl, followed by a layer of 4-0  Vicryl.  Benzoin was painted on the skin and Steri-Strips were applied and a  pressure dressing placed, and the patient returned to her room in  satisfactory condition.   COMPLICATIONS:  There were no immediate procedural complications.    RESULTS:  This demonstrates successful implantation of a Guidant single-  chamber defibrillator in a patient with resuscitated ventricular  fibrillation arrest in the setting of long QT syndrome.                                               Doylene Canning. Ladona Ridgel, M.D.    GWT/MEDQ  D:  05/01/2003  T:  05/01/2003  Job:  [id number]   cc:   Kirk Ruths, M.D.  [address]  [place]  [place] [address]  Fax: [phone]   Deanna Artis. Sharene Skeans, M.D.  [address]  [address]  [place]  [place] [address]  Fax: [phone]   Kathrine Cords, R.N. [facility]

## 2011-02-07 NOTE — H&P (Signed)
NAMEKRISTAIN, [name]             ACCOUNT NO.:  [id number]   MEDICAL RECORD NO.:  [id number]          PATIENT TYPE:  EMS   LOCATION:  ED                            FACILITY:  [facility]   PHYSICIAN:  [name], M.D.    DATE OF BIRTH:  [birth year]   DATE OF ADMISSION:  07/13/2005  DATE OF DISCHARGE:  LH                                HISTORY & PHYSICAL   CHIEF COMPLAINT:  Nausea, vomiting, diarrhea.   HISTORY OF PRESENT ILLNESS:  This is a 60 year old female with a history of  ill-defined seizure disorder which is currently untreated.  She also is  status post ICD which is an implantable cardiac defibrillator placement.  Apparently, her cardiac workup was negative and she was diagnosed with  primary arrhythmia.  These records are currently unavailable.  She also is  status post hysterectomy and breast biopsy, as well as cholecystectomy.   The patient presented to the emergency department the day prior to this  admission with sore in the region of her medial canthus of the right eye.  She was treated with Bactrim.  There was no material available for culture.   Within an hour of taking the Bactrim, the patient experienced acute onset of  nausea and vomiting, and profuse watery diarrhea.  She presented back to the  emergency department for evaluation.  She has been hydrated in the interim  awaiting her labs, etc., and has been treated symptomatically.  Currently,  she is somewhat improved.   LABORATORY DATA:  White count of 14,600, with left shift.  H&H normal.  Chemistries are normal except for a creatinine of 2.2 (this is apparently a  new finding).  Calcium 9.7.  Urine is clear.   The patient's possible cellulitis has remained somewhat static or is  slightly improved.   There is no history of headache, neurologic deficits, chest pain, syncope,  palpitations, significant abdominal pain except for that of crampy quality.  She also denies melena, hematemesis, or hematochezia, or  genitourinary  symptoms.   CURRENT MEDICATIONS:  Lopressor.   ALLERGIES:  None noted.   PAST MEDICAL HISTORY:  As reviewed.   FAMILY HISTORY:  Noncontributory.   REVIEW OF SYSTEMS:  Negative except as mentioned.   SOCIAL HISTORY:  Nonsmoker, nondrinker.   PHYSICAL EXAMINATION:  GENERAL:  A very pleasant, fully alert female who is  in no acute distress at this time.  VITAL SIGNS:  Currently, blood pressure is 113/70, heart rate 88, she is  afebrile, O2 saturation 96%.  HEENT:  Normocephalic, atraumatic.  The pupils are equal.  There is a small  excoriation of the nasal bridge with minimal blepharitis/question early  periorbital cellulitis.  The lower lid is not inflamed.  The eye itself is  not inflamed.  NECK:  Supple, no bruits, thyromegaly, lymphadenopathy.  LUNGS:  Clear.  HEART:  Heart sounds are normal except for a soft S3 gallop.  ABDOMEN:  Nontender, nondistended, bowel sounds are intact and somewhat  hyperactive.  EXTREMITIES:  No cyanosis, clubbing, or edema.  NEUROLOGIC:  Nonfocal.   ASSESSMENT:  1.  Possible early periorbital  cellulitis.  2.  Acute nausea, vomiting, and diarrhea of questionable etiology.  This      seems somewhat quick to be so severe if it were a medication reaction,      though this remains possible because of the possible coincidental      gastroenteritis.   PLAN:  Admit for hydration, intravenous antibiotics pending the  stabilization of her gastrointestinal situation.  Of note, the patient is  currently somewhat comfortable just sleepy.  We will follow and treat  expectantly.      [name], M.D.  Electronically Signed     MC/MEDQ  D:  07/13/2005  T:  07/13/2005  Job:  [id number]

## 2011-02-07 NOTE — Op Note (Signed)
   [name], [name]                         ACCOUNT NO.:  [id number]   MEDICAL RECORD NO.:  [id number]                   PATIENT TYPE:  INP   LOCATION:  3113                                 FACILITY:  [facility]   PHYSICIAN:  Deanna Artis. Sharene Skeans, M.D.           DATE OF BIRTH:  1950/09/24   DATE OF PROCEDURE:  04/26/2003  DATE OF DISCHARGE:                                 OPERATIVE REPORT   PROCEDURE:  Lumbar puncture.   INDICATIONS FOR PROCEDURE:  Evaluate for subarachnoid hemorrhage.   DESCRIPTION OF PROCEDURE:  The patient was given informed consent and then  was sterilely prepped and draped with local anesthesia at the L3-4  interspace. The needle was inserted and clear colored fluid was obtained on  the first pass. However, the fluid was not running quickly enough to  collect. The needle was repositioned and the tap became traumatic. The  needle was pulled out. A second needle was inserted at L4-5. However, I  could not enter the interspace. I re-entered the interspace at L3-4 and  slightly pink tinged fluid came out but then rapidly cleared. Clear  colorless fluid, 13 cc was obtained and sent to the laboratory. I expect  that there will be a small number of red cells in the fluid. However, there  does not appear at this time to have been a subarachnoid hemorrhage.   Opening pressure was 110 mm of water. Closing pressure was 80 mm of water.  The patient tolerated the procedure well.                                               Deanna Artis. Sharene Skeans, M.D.    [facility]  D:  04/26/2003  T:  04/27/2003  Job:  [id number]

## 2011-02-07 NOTE — H&P (Signed)
NAMEABERDEEN, HAFEN             ACCOUNT NO.:  [id number]   MEDICAL RECORD NO.:  [id number]          PATIENT TYPE:  OBV   LOCATION:  1845                         FACILITY:  [facility]   PHYSICIAN:  Bimal R. Sherryll Burger, MD      DATE OF BIRTH:  May 23, 1951   DATE OF ADMISSION:  04/24/2006  DATE OF DISCHARGE:                                HISTORY & PHYSICAL   CHIEF COMPLAINT:  Chest pain.   HISTORY OF PRESENT ILLNESS:  This is a 60 year old woman with a history of  ongoing tobacco abuse and a history of VF arrest thought to be secondary to  a long QT syndrome status post and ICD in 2004 who has been in her usual  state of health, until approximately three days ago when she started to  notice irregular heart beats and palpitations.  She states that these have  been intermittent, lasting less than 30 minutes, resolving spontaneously.  She denies any nausea, vomiting, diaphoresis or radiating pain, but these  symptoms however does endorse the mild shortness of breath.  She states that  it feels like a stabbing pressure, rates it about a 3-4/10.  She states that  yesterday she saw her primary care physician in [place] who upped her  Toprol XL from 100 mg a day to 115 mg a day and asked her to have her ICD  interrogated by her electro-physiologist here at [facility] cardiology.  This  evening, the patient continued to have these symptoms of intermittent chest  pain rated 3/10, this time lasting 30 minutes and resolving before she came  to the emergency room.  However, given her concern of these symptoms, she  decided to come to the emergency room for further evaluation.  At the  current time, she denies any chest pain, palpitations, lightheadedness,  dizziness, focal weakness.  She does endorse some mild dependent edema which  she currently does not have.  She sleeps on two pillows, and this has been  stable for the past two years.  She denies any orthopnea or PND.  She states  that she is currently  perimenopausal, and she is still grieving the loss of  one of her children.   PAST MEDICAL HISTORY:  1.  History of a VF arrest secondary to long QT syndrome, status post ICD,      single chamber on 05/01/2003.  History of a cardiac cath in 2004 without      any significant coronary disease and a normal LVEF.  2.  History of cerebrovascular disease status post a stroke.  3.  History of a seizure disorder.  4.  Depression.  5.  Anxiety disorder.  6.  Migraine headache.  7.  Fibromuscular dysplasia.  8.  Ongoing tobacco abuse.  9.  History of polycystic disease.  10. History of bilateral knee arthritis.  11. COPD.   ALLERGIES:  CODEINE, DEMEROL, ERYTHROMYCIN, SULFA.   MEDICATIONS:  1.  Valium 0.5 mg t.i.d. p.r.n.  2.  Paxil 60 mg q.h.s.  3.  Simvastatin 20 mg daily.  4.  Benadryl for seasonal allergies p.r.n.  5.  Toprol  XL recently increased from 100 to 150 mg daily.   SOCIAL HISTORY:  She lives in [place].  She is recently estranged from  her husband.  She is currently on disability for her osteoarthritis.  She  continues to smoke about a third of a pack of cigarettes a day and has been  smoking for 35 years.  She denies any alcohol or drug abuse.   FAMILY HISTORY:  Her mother is alive at 36 with dementia and multiple  strokes.  Her father passed away at the age of 30.  He did not have any  significant medical problems.  She had a grandmother with uterine cancer.  She has one sister with a brain aneurysm, one sister who died of colon  cancer.   REVIEW OF SYSTEMS:  As above in the HPI.  Remaining 10-point review of  systems is negative.   PHYSICAL EXAMINATION:  VITAL SIGNS:  Temp of 97.7, pulse of 69, respiratory  rate of 16, blood pressure of 108/66.  O2 saturation is 96% on room air.  GENERAL:  She is alert and oriented x3, in no acute distress.  HEENT:  Pupils are equal, round, reactive to light and accommodations.  Extraocular movements are intact.  NECK:  Supple  with no lymphadenopathy.  She has 2+ carotid upstrokes noted  bilaterally.  She has no carotid bruits.  Her thyroid is normal in size.  LUNGS:  Clear to auscultation bilaterally without any wheezes, rhonchi or  rales.  CARDIOVASCULAR:  Regular rate and rhythm, normal S1, S2, with a normal PMI.  It is nondisplaced.  No murmurs, rubs or gallops.  Her JVP is flat.  ABDOMEN:  Positive bowel sounds, some mild trunk obesity, soft, nontender,  nondistended without any hepatosplenomegaly.  EXTREMITIES:  No lower extremity edema, clubbing, cyanosis, 2+ radial and  posterior tibialis pulses symmetric bilaterally.  She has no rashes.  She  has 5/5 muscle strength in her proximal and distal major muscle groups  symmetric bilaterally.   NEUROLOGIC EXAM:  Cranial nerves 3-12 are grossly intact.   LABORATORY DATA:  Chest x-ray is pending at this time.  It was not ordered  in the emergency room.  An EKG shows a normal sinus rhythm at a rate of 70,  with normal axis and normal intervals.  Her QTC is 440 milliseconds.  She  has old anterolateral T-wave inversions that were present on old EKG.  An  alcohol level was undetectable.  A urine tox screen is positive for  benzodiazepines.  CBC shows a crit of 39, platelets 1564, white of 7.2.  Chem-7 is unremarkable for a creatinine of 1.1, glucose 97, and potassium of  3.3.   ASSESSMENT/PLAN:  1.  Atypical chest pain with a history of a negative cardiac catheterization      in 2004 with a normal EF.  2.  A history of VF arrest secondary to long QT syndrome and ICD in situ.  3.  Palpitations.  4.  Depression and anxiety.  5.  Ongoing tobacco abuse.   PLAN:  Patient will be admitted to a telemetry bed for observation.  She  will be ruled out by cardiac enzymes x3.  She will be started on an aspirin  a day.  She will also have her lipid panel checked in the morning.  I have held off on writing for a stress test until she is completely ruled out.  ICD will  need to be interrogated to assure that she is not  having runs of SVT or VT that are the cause of her symptoms.  She will be  continued on all of her outpatient medications.  She will need smoking  cessation counseling as well.  I have also ordered for a routine PA lateral  chest x-ray.           ______________________________  Eston Esters. Sherryll Burger, MD     BRS/MEDQ  D:  04/24/2006  T:  04/24/2006  Job:  [id number]

## 2011-02-07 NOTE — Discharge Summary (Signed)
[name], [name]                         ACCOUNT NO.:  [id number]   MEDICAL RECORD NO.:  [id number]                   PATIENT TYPE:  INP   LOCATION:  2028                                 FACILITY:  [facility]   PHYSICIAN:  Marlan Palau, M.D.               DATE OF BIRTH:  24-Jul-1951   DATE OF ADMISSION:  04/24/2003  DATE OF DISCHARGE:  05/02/2003                                 DISCHARGE SUMMARY   ADDENDUM TO DICTATION ON April 28, 2003   ADMISSION DIAGNOSES:  1. Headache.  2. Possible new subacute anterior cerebellar distribution.  3. Cerebrovascular infarction by CT.  4. Hypertension.   DISCHARGE DIAGNOSES:  1. Chronic small vessel disease, no acute stroke.  2. Migraine headache.  3. Seizure episode during this admission.  4. Fibromuscular dysplasia.  5. Minimal coronary artery disease.  6. Prolonged QT interval syndrome.  7. Tobacco abuse.   PROCEDURES DURING THIS ADMISSION:  1. MRA scan of the brain.  2. MRI angiogram.  3. Coronary catheterization.  4. A 2-D echocardiogram.  5. Brief intubation.  6. EEG study.  7. Defibrillator placement.   COMPLICATIONS TO ABOVE PROCEDURES:  None.   Please refer to prior summary for history of present illness, past medical  history, laboratory values and hospital course.   Since last dictation, this patient has been seen and evaluated by cardiology  and was felt to have a prolonged QT interval syndrome following a relatively  unremarkable coronary catheterization. This patient underwent a  defibrillator placement on May 01, 2003 and has recovered from this  procedure.  This patient has had some problems with hypokalemia during this  admission but this has been corrected.  Potassium is 4.6 prior to discharge.  The patient has no neurologic deficits.  No weakness, numbness or visual  field changes and headache has resolved.  Plans are for discharge of the  patient to home today.  To follow up with cardiology and with  [facility].  The patient has been taken off of Dilantin during  this admission.   DISCHARGE MEDICATIONS:  1. Prozac 20 mg 2 a day.  2. Aspirin 325 mg a day.  3. Patient has a Beclovent inhaler prescription at home.  She is to continue     this as needed.  4. Toprol XL 25 mg one a day.  5. A Valium prescription at home.  6. Potassium 10 mEq 2 a day.  7. Darvocet if needed for headache.  8. Zocor 20 mg a day.   The patient will need to have a follow up carotid Doppler study at some  point following discharge.  This will be done as an outpatient. The patient  is to have a no added salt diet, to stop smoking and to follow up in 3 weeks  with [facility].  Marlan Palau, M.D.    CKW/MEDQ  D:  05/02/2003  T:  05/02/2003  Job:  [id number]   cc:   Kirk Ruths, M.D.  [address]  [place]  [place] [address]  Fax: [phone]   Carole Binning, M.D. [facility]

## 2011-02-07 NOTE — Consult Note (Signed)
[name], [name]                         ACCOUNT NO.:  [id number]   MEDICAL RECORD NO.:  [id number]                   PATIENT TYPE:  INP   LOCATION:  2028                                 FACILITY:  [facility]   PHYSICIAN:  Doylene Canning. Ladona Ridgel, M.D.               DATE OF BIRTH:  [birth year]   DATE OF CONSULTATION:  04/28/2003  DATE OF DISCHARGE:                                   CONSULTATION   INDICATION FOR CONSULTATION:  Evaluation of VF arrest.   REFERRING PHYSICIAN:  Carole Binning, M.D.   HISTORY OF PRESENT ILLNESS:  The patient is a very pleasant 60 year old  woman with a history of tobacco abuse and anxiety disorder with a remote  history of a seizure disorder.  She was admitted to the hospital with what  appeared to be a stroke.  Several hours after admission, the patient became  unresponsive and had a witnessed VF arrest for which she was successfully  resuscitated.  She has had very minimal residual encephalopathy.  She  underwent catheterization which demonstrated no obstructive coronary disease  and preserved LV systolic function.  Her EKG has repeatedly demonstrated  prolongation of the QT interval between 470 and 500 msec.  Review of her  strips documenting the initiation of her episode demonstrate a PVC, which is  not particularly like coupled, initiating polymorphic VT which very quickly  degenerated into ventricular fibrillation.  The patient has no family  history of sudden cardiac death and has never had documented long QT  syndrome in the past.  She does have two distinct episodes of seizures, the  first occurring in the early 11s, the second in the 90s, where she had no  recollection of, but was told that she had seizure-like activity which  apparently spontaneously terminated.  It is unclear whether this was clearly  a seizure or was perhaps secondary to polymorphic VT which spontaneously  terminated.  She is now referred for additional evaluation and  treatment.   PAST MEDICAL HISTORY:  1. COPD.  2. History of depression.  3. History of polycystic disease.  4. History of arthritis.   SOCIAL HISTORY:  The patient is married and lives in [place].  She has a  one pack per day smoking history for 35 years, and denies ethanol use.   FAMILY HISTORY:  Notable for her father dying at age 44, her mother is still  alive at age 68, and she had one sibling with a brain aneurysm.   REVIEW OF SYSTEMS:  She denies fevers, chills, or night sweats.  Does have  chronic headaches.  Denies vision or hearing problems.  She denies skin  rashes or lesions.  She does have chest pain and shortness of breath, and  she has a history of arthralgias.  There is also a history of depression and  anxiety disorder as previously mentioned.  The rest of her review of systems  was negative.  Please see [name]'s note for additional details.   PHYSICAL EXAM:  GENERAL:  She is a pleasant, well-appearing, middle-aged  woman in no acute distress.  VITAL SIGNS:  Blood pressure was 112/74, the pulse 86 and regular, the  respirations were 18, temperature was 98.  HEENT:  Normocephalic and atraumatic.  The pupils were equal and round.  The  oropharynx was moist.  The sclerae were anicteric.  NECK:  No jugular venous distention.  There was no thyromegaly.  The  carotids were 2+ and symmetric.  Trachea was midline.  CARDIOVASCULAR:  Regular rate and rhythm with normal S1 and S2.  LUNGS:  Clear bilaterally to auscultation with no wheezes, rales, or  rhonchi.  ABDOMEN:  Soft, nontender, nondistended, there was no organomegaly.  EXTREMITIES:  No cyanosis, clubbing, or edema.  NEUROLOGIC:  Alert and oriented x3 with cranial nerves grossly intact.  Strength was 5/5 and symmetric.   IMPRESSION:  1. 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  2. Long QT syndrome likely causing #1.  3. Questionable history of seizure  disorder.  I wonder if this really     represents polymorphic ventricular tachycardia.  Once her ICD has been     inserted, I think we could consider stopping her anti-seizure     medications.  4. Anxiety disorder.  5. Lung disease.   DISCUSSION:  I have recommended ICD insertion for secondary prevention of  sudden cardiac death.  I would also recommend screening of her children and  consideration for low dose beta blocker therapy.  We will schedule her  defibrillator in several days, as soon as the schedule accommodates.                                               Doylene Canning. Ladona Ridgel, M.D.    GWT/MEDQ  D:  04/28/2003  T:  04/29/2003  Job:  [id number]   cc:   Shan Levans, M.D. [facility]   Marlan Palau, M.D.  [address]  [address]  [place]  [place] [address]  Fax: [phone]

## 2011-02-07 NOTE — Discharge Summary (Signed)
[name], [name]             ACCOUNT NO.:  [id number]   MEDICAL RECORD NO.:  [id number]          PATIENT TYPE:  OBV   LOCATION:  2020                         FACILITY:  [facility]   PHYSICIAN:  Gene Serpe, P.A. [facility]   DATE OF BIRTH:  1950/10/19   DATE OF ADMISSION:  04/24/2006  DATE OF DISCHARGE:  04/25/2006                                 DISCHARGE SUMMARY   PRINCIPAL DIAGNOSES:  Atypical chest pain.    A.  Negative serial cardiac markers.   SECONDARY DIAGNOSES:  1.  History of ventricular fibrillation at rest.      1.  Secondary to long QT syndrome.      2.  Status post single chamber ICD implantation August 2004.  2.  History of nonobstructive coronary artery disease.  3.  History of stroke.  4.  History of seizure disorder.  5.  History of anxiety/depression.  6.  History of migraines.  7.  History of fibromuscular dysplasia.  8.  COPD/ongoing tobacco.   PROCEDURES:  None.   HOSPITAL COURSE:  [name] is a 60 year old female with past medical  history outlined as above who has not had regular cardiology followup since  placement of an ICD by Dr. Lewayne Bunting.  She presented with chest pain felt  to be atypical.  Of note, she has not had regular cardiology followup since  ongoing placement of a guidant single chamber defibrillator by Dr. Lewayne Bunting in August of 2004 for treatment of VF arrest secondary to long QT.   The patient was ruled out for myocardial infarction with serial cardiac  markers.  D-dimer was mildly elevated at 0.77 but further workup revealed a  pulmonary embolus.   Arrangements were made for outpatient ICD interrogation.  The patient was  provided with a prescription for Xanax.   DISCHARGE MEDICATIONS:  1.  Toprol XL 100 mg daily.  2.  Simvastatin 20 mg nightly.  3.  Xanax 0.5 mg one tablet q. 4-6 hours p.r.n.  4.  Paxil 60 mg nightly.   OUTSTANDING LABS:  Normal serial cardiac markers, normal CBC.  D-dimer 0.77.  Potassium 3.3, normal  electrolytes and renal function.  Albumin 3.2.  Lipid  profile total cholesterol 130, triglycerides 296, HDL 26, and LDL 45.  TSH  0.64.  BMP 53.   FOLLOWUP:  1.  The patient was instructed to followup with her primary care physician      in 1 week.  2.  Arrangements will be made for the patient to have ICD interrogation.   CONDITION ON DISCHARGE:  Stable.      Gene Serpe, P.A. [facility]     GS/MEDQ  D:  04/25/2006  T:  04/25/2006  Job:  [id number]

## 2011-02-07 NOTE — Discharge Summary (Signed)
[name], [name]                         ACCOUNT NO.:  [id number]   MEDICAL RECORD NO.:  [id number]                   PATIENT TYPE:  INP   LOCATION:  2028                                 FACILITY:  [facility]   PHYSICIAN:  Marlan Palau, M.D.               DATE OF BIRTH:  Mar 20, 1951   DATE OF ADMISSION:  04/24/2003  DATE OF DISCHARGE:                                 DISCHARGE SUMMARY   ADMISSION DIAGNOSES:  1. Headache.  2. Possible new subacute anterior cerebral artery distribution     cerebrovascular infarction by CT.  3. Hypertension.   DISCHARGE DIAGNOSES:  1. Chronic small vessel disease.  2. Migraine headache.  3. Seizure disorder with recurrence during this admission.  4. Fibromuscular dysplasia.  5. Minimal coronary artery disease.  6. Tobacco abuse.   PROCEDURES DURING THIS ADMISSION:  1. MRI of the brain.  2. MRI angiogram.  3. Coronary catheterization.  4. Two-dimensional echocardiogram.  5. Brief intubation.   COMPLICATIONS TO ABOVE PROCEDURES:  None.   HISTORY OF PRESENT ILLNESS:  [name] is a 60 year old right-handed  white female, born 1951-01-15, with a history of hypertension, tobacco  abuse and anxiety disorder.  The patient has a history of frequent headaches  with bifrontal distribution but presented with a severe headache that was  holocephalic with nausea, no vomiting.  The patient had no numbness or  weakness of the face, arms or legs, came to the emergency room for an  evaluation and underwent a CT scan of the brain and showed a questionable  subacute infarct, left anterior cerebral artery distribution, and neurology  was called for further evaluation.  The patient was admitted.   PAST MEDICAL HISTORY:  Past medical history is significant for:  1. History of headaches.  2. Hypertension.  3. History of seizures in the past.  4. Anxiety and depression.  5. Polycystic kidney disease.  6. Gallbladder resection.  7. Knee  arthritis, left greater than right.  8. Ovarian cyst resection.  9. Benign breast lumpectomy.  10.      History of sinus disease.   MEDICATIONS ON ADMISSION:  1. Toprol-XL 25 mg a day.  2. Valium as needed.  3. Prozac 40 mg a day.  4. The patient also claims she was given a prescription for an inhaler, has     not yet filled this prescription.   ALLERGIES:  The patient states an allergy to [facility] DRUGS, DEMEROL, CODEINE  and VICODIN.   HABITS:  She smokes a pack of cigarettes a day, does not drink alcohol.   COMMENT:  Please refer to history and physical dictation summary for social  history, family history, review of systems and physical examination.   LABORATORY VALUES:  Laboratory values notable for sodium of 142, potassium  3.4, chloride of 107, CO2 29, glucose of 92, BUN of 5, creatinine of 0.7,  calcium 8.6, magnesium 1.9;  INR of 1.0; white count 12.8, hemoglobin 11.3,  hematocrit of 33.4, MCV of 97.0, platelets of 202,000.  Calcium level of  9.2, total protein 6.1, albumin 3.5, AST of 68, ALT of 31, ALP of 98, total  bilirubin 0.4; hemoglobin A1c of 5.2; homocysteine level of 8.39; CK level  initially of 113, repeat on the 4th of August 273, with a troponin I at that  date of 0.56; cholesterol level of 233, HDL of 32, VLDL of 51, LDL of 150;  TSH of 0.818; Dilantin level of 14.1.  Drug screen on admission positive for  benzodiazepines.  CSF analysis reveals 12 red cells, 1 white cell, protein  24, glucose 77.  VDRL on the spinal fluid is pending.   Chest x-ray shows cardiomegaly without evidence of acute cardiopulmonary  disease.   EKG reveals sinus rhythm, first-degree A-V block, heart rate of 66, T wave  abnormality, consider inferior ischemia, prolonged Q-T interval.   HOSPITAL COURSE:  This patient was admitted for evaluation of what was  thought to be a subacute stroke by CT of the brain and management of a  headache problem.  The patient was set up for an MRI scan  of the brain, 2-D  echocardiogram and MRI angiogram.  MRI scan of the brain was performed,  showing chronic white matter ischemic changes; no acute infarct was seen.  MRI angiogram intracranially shows no significant intracranial stenosis or  aneurysm.  MRI of the neck shows evidence of severe fibromuscular dysplasia  involving both vertebral arteries, multiple areas of fusiform aneurysm and  focal stenosis involving both internal carotid arteries with focal tight  stenosis of the right internal carotid artery.  The patient underwent an  echocardiogram as well that revealed ejection fraction of 55-65%.  Aortic  root was upper limits of normal in size.  There was systolic mitral bowing  of the anterior leaflet of the mitral valve and left atrial size was upper  limits of normal.  This patient, upon arriving back from the MRI testing  procedure, had an event that appeared to be consistent with a seizure  episode and entered a period of ventricular tachycardia, did complain of  chest pain prior to the event.  The patient was immediately transferred to  the intensive care unit, intubated and critical care medicine saw and  evaluated this patient.  A repeat CT scan of the brain showed no acute  changes.  The patient was noted to have some elevation in the troponin I;  cardiology was consulted and the patient was set up for a coronary  catheterization, which was done on the 5th of August 2004.  This study  revealed minimal coronary artery disease with about 20% stenosis of the LAD  and circumflex arteries.  No significant stenosis was noted.  The patient  was noted to have significant wheezing and Atrovent and Ventolin inhalers  were added with good response.  The patient was extubated rapidly and has  been transferred to the regular floor, is doing well, is alert and  cooperative, back to baseline, eating and has no focal neurologic deficits. Headache has essentially gone at this point.  The  patient is being treated  with a lipid-lowering agent in the form of Zocor 20 mg a day.  The patient  has had a prior history of seizures and it is not clear whether the seizure  on this admission may have been related to withdrawal from benzodiazepines  or may be related to a  primary seizure disorder.  The patient did undergo a  lumbar puncture during this admission to rule out subarachnoid hemorrhage  and no evidence of subarachnoid hemorrhage was noted.   DISCHARGE MEDICATIONS:  At this point, the patient will be discharged on:  1. Dilantin 300 mg at night.  2. Prozac 20 mg two daily.  3. Aspirin 325 mg a day.  4. The patient has a Ventolin inhaler prescription at home and is to     continue this.  5. Toprol-XL 25 mg one a day.  6. Valium prescription at home -- 2 mg one three times a day if needed.  7. Potassium 10 mEq two daily.  8. Darvocet-N 100 if needed for headache.  9. Zocor 20 mg a day.   ACTIVITY:  The patient is to have no strenuous activity for three weeks.   DIET:  No-added-salt diet.   SPECIAL DISCHARGE INSTRUCTIONS:  The patient is to stop smoking.  The  patient will be on some potassium, at least initially following discharge,  20 mEq a day.   FOLLOWUP:  She will follow up with [facility] within  three weeks following discharge.                                                Marlan Palau, M.D.    CKW/MEDQ  D:  04/28/2003  T:  04/29/2003  Job:  [id number]   cc:   Kirk Ruths, M.D.  [address]  [place]  [place] [address]  Fax: [phone]   Carole Binning, M.D. [facility]

## 2011-02-07 NOTE — Cardiovascular Report (Signed)
NAMEANALIYA, PORCO NO.:  [id number]   MEDICAL RECORD NO.:  [id number]                   PATIENT TYPE:  INP   LOCATION:  3113                                 FACILITY:  [facility]   PHYSICIAN:  Jonelle Sidle, M.D. [facility]        DATE OF BIRTH:  [birth year]   DATE OF PROCEDURE:  DATE OF DISCHARGE:                              CARDIAC CATHETERIZATION   INDICATIONS:  Ms. Vorhees is a 60 year old woman with a history of  hypertension, tobacco use, and reportedly remote seizure.  She was admitted  with significant headaches and noted by neurology to have evidence of old  stroke without new acute event.  She subsequently had a respiratory arrest  associated with fever and ventricular fibrillation and had mildly elevated  troponin I levels.  She has now been extubated and is hemodynamically  stable.  She is referred for diagnostic coronary angiography to assess for  any obstructive coronary artery disease.   PROCEDURES PERFORMED:  1. Left heart catheterization.  2. Selective coronary angiography.  3. Left ventriculography.   ACCESS AND EQUIPMENT:  The area about the right femoral artery was  anesthetized with 1% lidocaine and a 6-French sheath was placed in the right  femoral artery via the modified Seldinger technique.  Standard preformed 6-  [place] and JR4 catheters were used for selective coronary angiography  and an angled pigtail catheter was used for left heart catheterization, left  ventriculography.  All exchanges were made over a wire and the patient  tolerated the procedure well without immediate complications.   HEMODYNAMICS:  1. Left ventricle 156/23 mmHg.  2. Aorta 158/105 mmHg.   ANGIOGRAPHIC FINDINGS:  1. The left main coronary artery is free of significant flow limiting     coronary atherosclerosis.  2. The left anterior descending is a medium caliber vessel with two diagonal     branches proximally.  There is 20% diffuse stenosis  in the proximal to     mid vessel with no major flow limiting stenoses.  3. The circumflex coronary artery is a large caliber vessel with three     obtuse marginal branches.  There is a 20% stenosis in the proximal vessel     followed by an ectatic segment without flow limiting disease.  4. The right coronary artery is dominant and medium in caliber.  There are     minor luminal irregularities within this system.   LEFT VENTRICULOGRAPHY:  Left ventriculography is performed in the RAO  projection revealing an ejection fraction of 60-65% with trace mitral  regurgitation in the setting of ventricular ectopy.   DIAGNOSES:  1. Mild coronary atherosclerosis as outlined without major flow limiting     stenoses in the epicardial vessels.  2. Left ventricular ejection fraction of 60-65% with trace mitral     regurgitation in the setting of ventricular ectopy.  Jonelle Sidle, M.D. [facility]   SGM/MEDQ  D:  04/27/2003  T:  04/27/2003  Job:  [id number]

## 2011-02-07 NOTE — Discharge Summary (Signed)
[name], [name]             ACCOUNT NO.:  [id number]   MEDICAL RECORD NO.:  [id number]          PATIENT TYPE:  INP   LOCATION:  A311                          FACILITY:  [facility]   PHYSICIAN:  [name], M.D.    DATE OF BIRTH:  09-24-1950   DATE OF ADMISSION:  07/13/2005  DATE OF DISCHARGE:  10/23/2006LH                                 DISCHARGE SUMMARY   DISCHARGE DIAGNOSES:  1.  Periorbital cellulitis, excellent response to therapy.  2.  Gastroenteritis, resolved.  3.  History of ill-defined seizure disorder, currently untreated.  4.  Status post implantable cardiac defibrillator secondary to primary      arrhythmia.  Cardiac workup otherwise negative in the past.  5.  Status post hysterectomy.  6.  History of breast biopsy.  7.  Status post cholecystectomy.   HISTORY AND PHYSICAL:  For details regarding admission please refer to the  admitting note.  Briefly this patient presented to the emergency department  the day prior to this admission with a sore in the region of her medial  canthus of the right eye.  She was treated with Bactrim.  There was no  material available for culture.  Within an hour of taking the Bactrim the  patient experienced acute nausea, vomiting and profuse watery diarrhea.  Of  note the patient's cellulitis of the eye was somewhat static or slightly  improved from the prior evaluation.   In the emergency department she was found to have a white count of 14,000,  normal chemistries except for a creatinine of 2.2, calcium 9.7, urine clear.   COURSE IN THE HOSPITAL:  Patient was treated with Rocephin intravenously and  symptomatic therapy.  By the following day the patient was markedly improved  and eating well without recurrent nausea, vomiting, or diarrhea, no culture  material was available.  She was treated empirically with Cleocin upon  discharge.   DISPOSITION:  She will continue Cleocin 300 mg q.i.d. and be followed and  treated expectantly  as an outpatient.      [name], M.D.  Electronically Signed     MC/MEDQ  D:  08/24/2005  T:  08/24/2005  Job:  [id number]

## 2011-02-07 NOTE — Consult Note (Signed)
NAMEMARTHE, DANT                         ACCOUNT NO.:  [id number]   MEDICAL RECORD NO.:  [id number]                   PATIENT TYPE:  INP   LOCATION:  3113                                 FACILITY:  [facility]   PHYSICIAN:  Carole Binning, M.D. [facility]         DATE OF BIRTH:  May 28, 1951   DATE OF CONSULTATION:  04/26/2003  DATE OF DISCHARGE:                                   CONSULTATION   REASON FOR CONSULTATION:  Status post ventricular fibrillation arrest,  elevated cardiac enzymes.   HISTORY OF PRESENT ILLNESS:  Ms. Stormer is a 60 year old woman with  cardiovascular risk factors of hypertension, hyperlipidemia, and tobacco  abuse. She has no known prior cardiac history. She was admitted yesterday by  the neurology service with an acute severe headache and a possible cerebral  vascular accident. Yesterday while in the hospital, she had developed  symptoms of chest pain radiating to the jaw and left arm. She then felt  lightheaded as if she were falling and then became unresponsive. On the  monitor, she was shown to have ventricular tachycardia, degenerating to  ventricular fibrillation. She required CPR with several defibrillations and  was ultimately converted back to atrial fibrillation and then to sinus  rhythm. Subsequent cardiac enzymes were checked and showed a mild elevated  consistent with possible acute coronary syndrome.   The patient was intubated at the time of her code but has since been  extubated and is awake and talkative now. She does not have clear recall of  the events of yesterday but does recall having some chest pain. She denies  any prior history of chest pain. She does have a long history of exertional  dyspnea but also has COPD. She denies history of orthopnea or PND. She has a  remote history of seizures x2 while she was taking amitriptyline. She has  not had any seizures since coming off the amitriptyline. She otherwise  denies symptoms of syncope or  presyncope.   Cardiac risk factors as outlined above.   Past medical history, social history, family history, and review of systems  are as documented in the physician's assistant consult note.   PHYSICAL EXAMINATION:  GENERAL:  This is a chronically ill appearing middle-  aged woman in no acute distress.  VITAL SIGNS:  Maximum temperatures overnight has been 100.5, pulse 86 and  regular, respirations 28, blood pressure 120/74.  SKIN:  Warm and dry without generalized rash.  HEENT:  Normocephalic, atraumatic. Sclerae anicteric. Mouth is notable for  poor dentition. Oropharynx is unremarkable.  NECK:  No adenopathy or thyromegaly. No JVD. Carotid upstroke is normal  without bruit.  CHEST:  There are diffuse inspiratory and expiratory rhonchi. Breath sounds  are distant.  CARDIAC:  Apical impulse not displaced.  regular rate and rhythm. Positive  S4. Normal S1 and S2 without rub, murmur, or S3.  ABDOMEN:  Soft, nontender, normal bowel sounds, no organomegaly, no bruits.  EXTREMITIES:  No clubbing, cyanosis, or edema. Peripheral pulses:  Femoral  are 2+ bilaterally without bruits. Pedal pulses are 2+ bilaterally.   LABORATORY DATA:  Initial EKG showed sinus rhythm with first degree AV block  and mild QT prolongation. There are nonspecific T-wave changes. Followup EKG  today reveals sinus rhythm with approximately 0.5-mm lateral ST segment  depression. Again, QT is mildly prolonged. Other laboratory data is noted in  the chart. Most notably, she had troponin levels of 0.16, then 0.56, then  0.35. Then she had mildly elevated CK-MB levels of 3.2, 6.2, and 5.1  respectively.   ASSESSMENT:  The patient is a 60 year old woman with multiple cardiovascular  risk factors. She has been admitted by the neurology service with possible  stroke; however, further workup has by report revealed no evidence of acute  cerebral vascular accident. While in the hospital, she had an episode of  chest pain  followed by ventricular tachycardia/ventricular fibrillation and  cardiac arrest requiring defibrillation. She has since been stabilized and  is currently chest pain free and hemodynamically stable.   RECOMMENDATIONS:  1. Check magnesium to rule out hypomagnesemia.  2. Replace potassium as needed.  3. Recommend aspirin and low dose beta blocker therapy.  4. Continue telemetry monitoring for further arrhythmias. If she has any     further ventricular arrhythmias, would start amiodarone.  5. Cardiac catheterization to rule out coronary artery disease.  6. Institution of statin therapy for hyperlipidemia.  7. If there is no significant coronary artery disease, the patient will     require electrophysiologic evaluation.                                               Carole Binning, M.D. [facility]    MWP/MEDQ  D:  04/26/2003  T:  04/27/2003  Job:  [id number]   cc:   Deanna Artis. Sharene Skeans, M.D.  [address]  [address]  [place]  [place] [address]  Fax: [phone]   Kirk Ruths, M.D.  [address]  [place]  [place] [address]  Fax: [phone]

## 2011-02-07 NOTE — H&P (Signed)
NAMEKIAIRA, POINTER NO.:  [id number]   MEDICAL RECORD NO.:  [id number]                   PATIENT TYPE:  INP   LOCATION:  3029                                 FACILITY:  [facility]   PHYSICIAN:  Marlan Palau, M.D.               DATE OF BIRTH:  1950-10-18   DATE OF ADMISSION:  04/24/2003  DATE OF DISCHARGE:                                HISTORY & PHYSICAL   HISTORY OF PRESENT ILLNESS:  [name] is a 60 year old right handed  white female born 06/18/1951 with a history of hypertension, tobacco abuse,  anxiety disorder. The patient has a history of relatively frequent headaches  that attributes to sinus problems in the bifrontal distribution. The patient  presents with severe headache, has been present for about three days  associated with whole encephalic headache, nausea, no vomiting. No numbness  or weakness of the face, arms, or legs or visual changes. The patient was  being evaluated for this problem and underwent CT scan of the brain this  evening. CT revealed a subacute infarct in the left anterior cerebral artery  distribution. Neurology was called for further evaluation.   PAST MEDICAL HISTORY:  1. 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  2. Headache.  3. Hypertension.  4. Seizures x2 in the past, currently not on seizure medication.  5. Anxiety/depression.  6. Polycystic disease.  7. Gallbladder dissection.  8. Knee arthritis, left greater than right.  9. Ovarian cyst resection.  10.      Benign breast lumpectomy.  11.      Sinus disease.   MEDICATIONS:  1. Toprol-XL 25 mg a day.  2. Valium as needed.  3. Prozac 40 mg a day.   ALLERGIES:  The patient is allergic to MYCIN drugs, DEMEROL, CODEINE,  VICODIN.   SOCIAL HISTORY:  Smokes a pack of cigarettes a day, does not drink alcohol.  The patient lives with mother currently in [place], [place]. Is  married, has two children, both alive and  well.   FAMILY HISTORY:  Notable that mother is alive, age 38, with stroke. Father  died age 15 with depression. One sister, mental retardation of sister with  brain aneurysm.   REVIEW OF SYSTEMS:  Notable for no fevers, chills. The patient denies neck  stiffness but does have some neck off and on. Does note some chronic  shortness of breath. Denies chest pain. Has had some chronic diarrhea since  her gallbladder resection. Has occasional urinary tract infections. No  troubles otherwise controlling the bowels or bladder.   PHYSICAL EXAMINATION:  VITAL SIGNS:  Blood pressure 128/65, heart rate 66,  respiratory rate 20, temperature afebrile.  GENERAL:  This patient is a minimally obese white female who is alert and  cooperative at the time of examination.  HEENT:  Head is atraumatic. Eyes:  Pupils are equal, round, and reactive to  light. Disks  are flat bilaterally.  NECK:  Supple. No carotid bruits are noted.  RESPIRATORY EXAMINATION:  Clear.  CARDIOVASCULAR EXAMINATION:  Reveals a regular rate and rhythm. No obvious  murmurs or rubs noted.  ABDOMEN:  Reveals positive bowel sounds. No organomegaly or tenderness is  noted.  EXTREMITIES:  Without significant edema.  NEUROLOGICAL EXAMINATION:  Cranial nerves as above. Facial symmetry is  present. The patient has good sensation in the face to pinprick and soft  touch bilaterally. Has good strength in facial muscles, muscle strength, and  shoulder shrug bilaterally. Speech is well enunciated and not aphasic. Motor  testing reveals 5/5 strength in all fours. Good symmetric motor tone is  noted throughout. Sensory testing is intact to pinprick, soft touch, and  vibratory sensation throughout. Deep tendon reflexes are symmetric. Normal  toes downgoing bilaterally. The patient has good finger-nose-finger and toe-  to-finger bilaterally. The patient was not ambulated. No drift is seen.   LABORATORY DATA:  CT of the head is as above.  Laboratory values are pending  at this time. EKG and chest x-ray are pending.   IMPRESSION:  1. 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  2. Hypertension.  3. Tobacco abuse.  4. History of polycystic kidney disease.   The patient has a generalized headache but otherwise no neurologic deficit.  CT does show subacute stroke at this point. We will admit this patient for  further evaluation.   PLAN:  1. Admission to [facility].  2. Aspirin therapy.  3. MRI of the brain.  4. MRI angiogram.  5. Two-dimensional echocardiogram.  6. Admission blood work.  7. The patient does have a history of polycystic kidney disease and a sister     with cerebral aneurysm. The patient will need to be evaluated for an     aneurysm as well.                                                Marlan Palau, M.D.    CKW/MEDQ  D:  04/24/2003  T:  04/25/2003  Job:  [id number]   cc:   Kirk Ruths, M.D.  [address]  [place]  [place] [address]  Fax: [phone]

## 2011-02-27 ENCOUNTER — Ambulatory Visit (INDEPENDENT_AMBULATORY_CARE_PROVIDER_SITE_OTHER): Payer: Medicaid Other | Admitting: Internal Medicine

## 2011-02-27 ENCOUNTER — Encounter: Payer: Self-pay | Admitting: Internal Medicine

## 2011-02-27 ENCOUNTER — Encounter: Payer: Self-pay | Admitting: *Deleted

## 2011-02-27 DIAGNOSIS — I469 Cardiac arrest, cause unspecified: Secondary | ICD-10-CM

## 2011-02-27 DIAGNOSIS — Z95810 Presence of automatic (implantable) cardiac defibrillator: Secondary | ICD-10-CM

## 2011-02-27 DIAGNOSIS — Z950 Presence of cardiac pacemaker: Secondary | ICD-10-CM

## 2011-02-27 DIAGNOSIS — I2510 Atherosclerotic heart disease of native coronary artery without angina pectoris: Secondary | ICD-10-CM

## 2011-02-27 NOTE — Assessment & Plan Note (Signed)
She denies anginal symptoms. We'll continue her current medical therapy.

## 2011-02-27 NOTE — Progress Notes (Signed)
HPI [name] returns today for followup. 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 She is status post ICD implantation. She denies c/p, sob, or peripheral edema. She has lost about 15 lbs. No ICD shocks. She has reached ERI. Allergies  Allergen Reactions  . Codeine   . Propoxyphene N-Acetaminophen      Current Outpatient Prescriptions  Medication Sig Dispense Refill  . aspirin 81 MG tablet Take 81 mg by mouth daily.        Marland Kitchen atorvastatin (LIPITOR) 40 MG tablet Take 40 mg by mouth daily. Take 1/2 tab daily       . diazepam (VALIUM) 10 MG tablet Take 1 tablet (10 mg total) by mouth as needed for anxiety.  30 tablet  0  . diphenhydrAMINE (BENADRYL) 25 MG tablet Take 25 mg by mouth every 6 (six) hours as needed.        . metoprolol (TOPROL-XL) 50 MG 24 hr tablet Take 50 mg by mouth daily.        Marland Kitchen PAROXETINE HCL PO Take 1 tablet by mouth 2 (two) times daily.       Marland Kitchen DISCONTD: METOPROLOL TARTRATE PO Take 1 tablet by mouth daily.       Marland Kitchen albuterol (VENTOLIN HFA) 108 (90 BASE) MCG/ACT inhaler Inhale 2 puffs into the lungs every 6 (six) hours as needed.        . Fluticasone-Salmeterol (ADVAIR DISKUS IN) Inhale into the lungs.           Past Medical History  Diagnosis Date  . Ventricular fibrillation   . Coronary artery disease     non obstructive  . Stroke   . Seizures   . Anxiety   . Depression   . Migraine   . Fibromuscular dysplasia   . Tobacco abuse     ROS:   All systems reviewed and negative except as noted in the HPI.   Past Surgical History  Procedure Date  . Pacemaker insertion     ICD     History reviewed. No pertinent family history.   History   Social History  . Marital Status: Legally Separated    Spouse Name: N/A    Number of Children: N/A  . Years of Education: N/A   Occupational History  . Not on file.   Social History Main Topics  . Smoking status: Current Everyday  Smoker -- 1.0 packs/day for 40 years    Types: Cigarettes  . Smokeless tobacco: Never Used  . Alcohol Use: No  . Drug Use: No  . Sexually Active: Not on file   Other Topics Concern  . Not on file   Social History Narrative  . No narrative on file     BP 142/78  Pulse 76  Ht 5\' 4"  (1.626 m)  Wt 183 lb 12.8 oz (83.371 kg)  BMI 31.55 kg/m2  Physical Exam:  Well appearing NAD HEENT: Unremarkable Neck:  No JVD, no thyromegally Lymphatics:  No adenopathy Back:  No CVA tenderness Lungs:  Clear HEART:  Regular rate rhythm, no murmurs, no rubs, no clicks Abd:  positive bowel sounds, no organomegally, no rebound, no guarding Ext:  2 plus pulses, no edema, no cyanosis, no clubbing Skin:  No rashes no nodules Neuro:  CN II through XII intact, motor grossly intact  DEVICE  Normal device function.  See PaceArt for details.   Assess/Plan:

## 2011-02-27 NOTE — Assessment & Plan Note (Signed)
Interrogation of her device today demonstrates no sustained ventricular arrhythmias. She does however have nonsustained VT. She is minimally symptomatic with this.

## 2011-02-27 NOTE — Assessment & Plan Note (Signed)
Her device is working normally but has reached elective replacement. We will schedule ICD generator change.

## 2011-03-05 ENCOUNTER — Other Ambulatory Visit: Payer: Self-pay | Admitting: Internal Medicine

## 2011-03-05 DIAGNOSIS — Z01810 Encounter for preprocedural cardiovascular examination: Secondary | ICD-10-CM

## 2011-03-13 ENCOUNTER — Other Ambulatory Visit (INDEPENDENT_AMBULATORY_CARE_PROVIDER_SITE_OTHER): Payer: Medicaid Other | Admitting: *Deleted

## 2011-03-13 DIAGNOSIS — I2510 Atherosclerotic heart disease of native coronary artery without angina pectoris: Secondary | ICD-10-CM

## 2011-03-13 DIAGNOSIS — Z01810 Encounter for preprocedural cardiovascular examination: Secondary | ICD-10-CM

## 2011-03-14 ENCOUNTER — Emergency Department: Payer: Self-pay | Admitting: Emergency Medicine

## 2011-03-14 LAB — CBC WITH DIFFERENTIAL/PLATELET
Eosinophils Absolute: 0.5 10*3/uL (ref 0.0–0.7)
Eosinophils Relative: 7 % — ABNORMAL HIGH (ref 0–5)
HCT: 41.7 % (ref 36.0–46.0)
Hemoglobin: 13.1 g/dL (ref 12.0–15.0)
Lymphocytes Relative: 41 % (ref 12–46)
Lymphs Abs: 3 10*3/uL (ref 0.7–4.0)
MCH: 31.3 pg (ref 26.0–34.0)
MCV: 99.8 fL (ref 78.0–100.0)
Monocytes Absolute: 0.6 10*3/uL (ref 0.1–1.0)
Monocytes Relative: 8 % (ref 3–12)
Platelets: 274 10*3/uL (ref 150–400)
RBC: 4.18 MIL/uL (ref 3.87–5.11)

## 2011-03-14 LAB — BASIC METABOLIC PANEL
CO2: 27 mEq/L (ref 19–32)
Calcium: 9.6 mg/dL (ref 8.4–10.5)
Chloride: 101 mEq/L (ref 96–112)
Creat: 1.4 mg/dL — ABNORMAL HIGH (ref 0.50–1.10)
Glucose, Bld: 89 mg/dL (ref 70–99)

## 2011-03-17 ENCOUNTER — Ambulatory Visit (HOSPITAL_COMMUNITY)
Admission: RE | Admit: 2011-03-17 | Discharge: 2011-03-17 | Disposition: A | Discharge status: Home / Self Care | Payer: Medicaid Other | Source: Ambulatory Visit | Attending: Internal Medicine | Admitting: Internal Medicine

## 2011-03-17 ENCOUNTER — Ambulatory Visit (HOSPITAL_COMMUNITY): Payer: Medicaid Other

## 2011-03-17 DIAGNOSIS — T82198A Other mechanical complication of other cardiac electronic device, initial encounter: Secondary | ICD-10-CM

## 2011-03-17 DIAGNOSIS — Z01818 Encounter for other preprocedural examination: Secondary | ICD-10-CM | POA: Insufficient documentation

## 2011-03-17 DIAGNOSIS — I4901 Ventricular fibrillation: Secondary | ICD-10-CM

## 2011-03-17 DIAGNOSIS — Z4502 Encounter for adjustment and management of automatic implantable cardiac defibrillator: Secondary | ICD-10-CM | POA: Insufficient documentation

## 2011-03-17 DIAGNOSIS — I4581 Long QT syndrome: Secondary | ICD-10-CM | POA: Insufficient documentation

## 2011-03-25 NOTE — Op Note (Signed)
  [name], SIMCOE NO.:  [id number]  MEDICAL RECORD NO.:  [id number]  LOCATION:  [facility]                         FACILITY:  [facility]  PHYSICIAN:  Duke Salvia, MD, FACCDATE OF BIRTH:  [birth year]  DATE OF PROCEDURE:  03/17/2011 DATE OF DISCHARGE:                              OPERATIVE REPORT   PREOPERATIVE DIAGNOSIS:  Ventricular fibrillation thought to be attributed to long QT syndrome.  POSTOPERATIVE DIAGNOSIS:  Ventricular fibrillation thought to be attributed to long QT syndrome.  PROCEDURE:  Explantation of a previously implanted device pocket revision, repair of the lead, insertion of a new device intraoperative defibrillation threshold testing.  Following obtaining informed consent, the patient was brought to the electrophysiology laboratory and placed on the fluoroscopic table in a supine position.  After routine prep and drape, lidocaine was infiltrated just below the prior incision, carried down to the layer of device pocket using sharp dissection and electrocautery.  The pocket was opened.  The device was explanted.  The retaining suture was removed. The pocket was extended caudally to allow for housing of a larger device.  Heme discoloration of the rate sense portion of the lead was identified.  Medical adhesive was placed over the rate sense lead down to and including the junction with the yoke.  Interrogation of the lead demonstrated R-wave of 13.1 with a pace impedance of 294, threshold 0.5 at 0.5, proximal coil impedance via pacing was 107, distal was 123. With these acceptable parameters recorded, the lead was secured to a [employer] ICD model E141, serial number [id number] through the device bipolar R-wave was 40 with a pace impedance of 942 with threshold of 0.6 at 0.4.  High-voltage impedance was 51 ohms.  Ventricular fibrillation was then induced via T-wave shock.  After total duration of 6 seconds of 14-joule shock was  delivered through a reversed polarity in a biphasic waveform and measured resistance to 42 ohms terminating ventricular fibrillation restoring sinus rhythm.  The device was implanted.  The pocket was copiously irrigated with antibiotic containing saline solution.  Hemostasis was assured.  The lead and pulse generator were placed in the pocket, secured to the prepectoral fascia. The wound was then closed in three layers in normal fashion.  The wound was washed, dried, and benzoin and Steri-Strip dressings were applied. Needle counts, sponge counts, and instrument counts were correct at the end of the procedure according to the staff.  The patient tolerated the procedure without apparent complications.     Duke Salvia, MD, [facility]     SCK/MEDQ  D:  03/17/2011  T:  03/18/2011  Job:  [id number]  Electronically Signed by Sherryl Manges MD [facility] on 03/25/2011 04:35:45 PM

## 2011-03-27 ENCOUNTER — Emergency Department: Payer: Self-pay | Admitting: Emergency Medicine

## 2011-03-30 ENCOUNTER — Telehealth: Payer: Self-pay | Admitting: Adult Health

## 2011-03-30 NOTE — Telephone Encounter (Signed)
[name] called because she was worried that her HR would go up. It is not rapid now, but she is worried that it may go up again. She states she has been in the ER 3 times this week for elevated HR. She has appointment with Dr. Ladona Ridgel on the 18 th of July to discuss medication changes if necessary. She wondered what to do if HR becomes elevated until appointment.  She is on metoprolol, 50 mg daily.  HR right now is 72 bpm per her BP machine.  I advised her that if HR is elevated greater than 110 or higher sustained, she can take an  Additional 1/2 tablet or 25 mg of metoprolol.  She verbalized understanding. She is to follow with Dr. Ladona Ridgel on regularly scheduled appointment. Reassurance is given.

## 2011-04-02 ENCOUNTER — Ambulatory Visit: Payer: Medicaid Other | Admitting: *Deleted

## 2011-04-03 ENCOUNTER — Encounter: Payer: Self-pay | Admitting: Cardiovascular Disease

## 2011-04-04 ENCOUNTER — Encounter: Payer: Self-pay | Admitting: Internal Medicine

## 2011-04-07 ENCOUNTER — Ambulatory Visit (INDEPENDENT_AMBULATORY_CARE_PROVIDER_SITE_OTHER): Payer: Medicaid Other | Admitting: Cardiovascular Disease

## 2011-04-07 ENCOUNTER — Encounter: Payer: Self-pay | Admitting: Cardiovascular Disease

## 2011-04-07 DIAGNOSIS — Z95810 Presence of automatic (implantable) cardiac defibrillator: Secondary | ICD-10-CM

## 2011-04-07 DIAGNOSIS — F17200 Nicotine dependence, unspecified, uncomplicated: Secondary | ICD-10-CM

## 2011-04-07 DIAGNOSIS — I4891 Unspecified atrial fibrillation: Secondary | ICD-10-CM

## 2011-04-07 DIAGNOSIS — I4581 Long QT syndrome: Secondary | ICD-10-CM

## 2011-04-07 DIAGNOSIS — I2510 Atherosclerotic heart disease of native coronary artery without angina pectoris: Secondary | ICD-10-CM

## 2011-04-07 DIAGNOSIS — I679 Cerebrovascular disease, unspecified: Secondary | ICD-10-CM

## 2011-04-07 DIAGNOSIS — J449 Chronic obstructive pulmonary disease, unspecified: Secondary | ICD-10-CM

## 2011-04-07 DIAGNOSIS — F411 Generalized anxiety disorder: Secondary | ICD-10-CM

## 2011-04-07 DIAGNOSIS — I469 Cardiac arrest, cause unspecified: Secondary | ICD-10-CM

## 2011-04-07 MED ORDER — SIMVASTATIN 20 MG PO TABS: 20.0000 mg | ORAL_TABLET | Freq: Every day | ORAL | Status: DC

## 2011-04-07 MED ORDER — METOPROLOL SUCCINATE ER 25 MG PO TB24: 25.0000 mg | ORAL_TABLET | Freq: Every day | ORAL | Status: DC

## 2011-04-07 MED ORDER — METOPROLOL SUCCINATE ER 50 MG PO TB24: 50.0000 mg | ORAL_TABLET | Freq: Every day | ORAL | Status: DC

## 2011-04-07 NOTE — Assessment & Plan Note (Signed)
She reports having 8 or fibrillation over the past 6 months. We'll try to obtain the notes from [facility] for our review. Currently on aspirin. Typically converts without intervention. We will increase her metoprolol to 50 mg in the morning, 25 mg in the evening with extra 25 mg as needed for breakthrough tachycardia palpitations.

## 2011-04-07 NOTE — Assessment & Plan Note (Signed)
We'll continue aggressive medical management. I believe her shoulder pain with anxiety is musculoskeletal. Typically she has no significant chest pain or symptoms with exertion apart from when she is very stressed.

## 2011-04-07 NOTE — Assessment & Plan Note (Signed)
She continues to smoke one pack per week. We have encouraged her to quit smoking.

## 2011-04-07 NOTE — Assessment & Plan Note (Signed)
She reports having underlying anxiety disorder and takes Valium. Have asked her to establish with a new primary care physician near future so that they can help manage this and her medications.

## 2011-04-07 NOTE — Patient Instructions (Addendum)
You are doing well. Please take  metoprolol 50 in the morning and 25 mg in the afternoon. Take extra metorpolol 25 mg as needed for atrial fib. Episodes. Please call us if you have new issues that need to be addressed before your next appt.  We will call you for a follow up Appt. In 6 months

## 2011-04-07 NOTE — Progress Notes (Signed)
Patient ID: [name], female    DOB: 1950-10-28, 60 y.o.   MRN: [id number]  HPI Comments: [name] Is a 60 year old woman with history of  VF arrest status post ICD insertion, With recent generator change several weeks ago by Dr. Graciela Husbands,  history of VT, History of atrial fibrillation she reports over the past 5 months with at least 5-10 episodes lasting for several hours at a time, converting typically on their own.  She presents to establish care. She recently moved from [place] to the area. She does not have primary care yet. She is uncertain who will take Medicaid. She denies any recent atrial fibrillation. Last episode was several weeks ago prior to her generator change out. She has been to [facility] twice. She has not taken medication as needed for atrial fibrillation episodes.  She denies c/p or syncope or any ICD shocks.  She has been placed on ASA.  She does have some shoulder discomfort at times radiating up into her posterior neck when she is anxious and rushing. She takes Valium for this.  EKG shows normal sinus rhythm with rate 71 beats per minute with first degree AV block, T wave abnormality in the precordial leads and inferior leads   Outpatient Encounter Prescriptions as of 04/07/2011  Medication Sig Dispense Refill  . albuterol (VENTOLIN HFA) 108 (90 BASE) MCG/ACT inhaler Inhale 2 puffs into the lungs every 6 (six) hours as needed.        Marland Kitchen aspirin 325 MG tablet Take 325 mg by mouth daily.        . diazepam (VALIUM) 10 MG tablet Take 5 mg by mouth 3 (three) times daily.        . diphenhydrAMINE (BENADRYL) 25 MG tablet Take 25 mg by mouth every 6 (six) hours as needed.        . Fluticasone-Salmeterol (ADVAIR DISKUS IN) Inhale into the lungs.       . metoprolol (TOPROL-XL) 50 MG 24 hr tablet Take 1 tablet (50 mg total) by mouth daily.  30 tablet  6  . PARoxetine (PAXIL) 30 MG tablet Take 60 mg by mouth every morning.        . simvastatin (ZOCOR) 20 MG tablet Take 1 tablet  (20 mg total) by mouth at bedtime.  30 tablet  6     Review of Systems  Constitutional: Negative.   HENT: Negative.   Eyes: Negative.   Respiratory: Negative.   Cardiovascular: Positive for palpitations.  Gastrointestinal: Negative.   Musculoskeletal: Positive for back pain and arthralgias.  Skin: Negative.   Neurological: Negative.   Hematological: Negative.   Psychiatric/Behavioral: Negative.   All other systems reviewed and are negative.    BP 123/84  Pulse 75  Ht 5\' 4"  (1.626 m)  Wt 187 lb (84.823 kg)  BMI 32.10 kg/m2   Physical Exam  Nursing note and vitals reviewed. Constitutional: She is oriented to person, place, and time. She appears well-developed and well-nourished.  HENT:  Head: Normocephalic.  Nose: Nose normal.  Mouth/Throat: Oropharynx is clear and moist.  Eyes: Conjunctivae are normal. Pupils are equal, round, and reactive to light.  Neck: Normal range of motion. Neck supple. No JVD present.  Cardiovascular: Normal rate, regular rhythm, S1 normal, S2 normal, normal heart sounds and intact distal pulses.  Exam reveals no gallop and no friction rub.   No murmur heard. Pulmonary/Chest: Effort normal and breath sounds normal. No respiratory distress. She has no wheezes. She has no rales. She  exhibits no tenderness.  Abdominal: Soft. Bowel sounds are normal. She exhibits no distension. There is no tenderness.  Musculoskeletal: Normal range of motion. She exhibits no edema and no tenderness.  Lymphadenopathy:    She has no cervical adenopathy.  Neurological: She is alert and oriented to person, place, and time. Coordination normal.  Skin: Skin is warm and dry. No rash noted. No erythema.  Psychiatric: She has a normal mood and affect. Her behavior is normal. Judgment and thought content normal.         Assessment and Plan

## 2011-04-07 NOTE — Assessment & Plan Note (Signed)
ICD has been followed by Dr. Ladona Ridgel in the past. She has close followup with him in the next week.

## 2011-04-09 ENCOUNTER — Encounter: Payer: Self-pay | Admitting: Internal Medicine

## 2011-04-09 ENCOUNTER — Ambulatory Visit (INDEPENDENT_AMBULATORY_CARE_PROVIDER_SITE_OTHER): Payer: Medicaid Other | Admitting: Internal Medicine

## 2011-04-09 DIAGNOSIS — I4581 Long QT syndrome: Secondary | ICD-10-CM

## 2011-04-09 DIAGNOSIS — Z95810 Presence of automatic (implantable) cardiac defibrillator: Secondary | ICD-10-CM

## 2011-04-09 DIAGNOSIS — I4891 Unspecified atrial fibrillation: Secondary | ICD-10-CM

## 2011-04-09 MED ORDER — DIAZEPAM 10 MG PO TABS: 5.0000 mg | ORAL_TABLET | Freq: Three times a day (TID) | ORAL | Status: DC

## 2011-04-09 MED ORDER — DIAZEPAM 5 MG PO TABS: 2.5000 mg | ORAL_TABLET | Freq: Four times a day (QID) | ORAL | Status: DC

## 2011-04-09 MED ORDER — PAROXETINE HCL 30 MG PO TABS: 60.0000 mg | ORAL_TABLET | ORAL | Status: DC

## 2011-04-09 NOTE — Assessment & Plan Note (Signed)
It is unclear to me whether she has NSVT or PAF. Will follow.

## 2011-04-09 NOTE — Assessment & Plan Note (Signed)
Today, we discussed genetic testing. I have discussed the pro's and con's of doing so. We will look into the cost and allow her to decide on ongoing testing.

## 2011-04-09 NOTE — Patient Instructions (Signed)
Follow up in 3 months with Dr. Ladona Ridgel.

## 2011-04-09 NOTE — Progress Notes (Signed)
HPI [name] returns today for followup. She recently underwent ICD generator change as her device had reached ERI. She has a h/o prolonged QT and syncope and underwent ICD implant. She denies c/p, sob or peripheral edema. No syncope. She has rare palpitations. Allergies  Allergen Reactions  . Codeine   . Propoxyphene N-Acetaminophen      Current Outpatient Prescriptions  Medication Sig Dispense Refill  . albuterol (VENTOLIN HFA) 108 (90 BASE) MCG/ACT inhaler Inhale 2 puffs into the lungs every 6 (six) hours as needed.        Marland Kitchen aspirin 325 MG tablet Take 325 mg by mouth daily.        Marland Kitchen atorvastatin (LIPITOR) 40 MG tablet Take 40 mg by mouth daily.       . diazepam (VALIUM) 10 MG tablet Take 5 mg by mouth 3 (three) times daily.        . diphenhydrAMINE (BENADRYL) 25 MG tablet Take 25 mg by mouth every 6 (six) hours as needed.        . Fluticasone-Salmeterol (ADVAIR DISKUS IN) Inhale into the lungs.       . metoprolol (TOPROL-XL) 50 MG 24 hr tablet Take 1 tablet (50 mg total) by mouth daily.  30 tablet  6  . metoprolol succinate (TOPROL XL) 25 MG 24 hr tablet Take 1 tablet (25 mg total) by mouth at bedtime.  30 tablet  6  . PARoxetine (PAXIL) 30 MG tablet Take 60 mg by mouth every morning.        . simvastatin (ZOCOR) 20 MG tablet Take 1 tablet (20 mg total) by mouth at bedtime.  30 tablet  6     Past Medical History  Diagnosis Date  . Ventricular fibrillation   . Coronary artery disease     non obstructive  . Stroke   . Seizures   . Anxiety   . Depression   . Migraine   . Fibromuscular dysplasia   . Tobacco abuse   . COPD (chronic obstructive pulmonary disease)   . Arthritis of knee     Bilateral  . Tobacco abuse   . Migraine headache   . Anxiety disorder   . Depression   . Seizure disorder   . Cerebrovascular disease   . CAD (coronary artery disease)   . Automatic implantable cardiac defibrillator in situ   . Long QT syndrome   . Cardiac arrest     ROS:   All  systems reviewed and negative except as noted in the HPI.   Past Surgical History  Procedure Date  . Pacemaker insertion 05/01/2003    ICD, implantation of a Guidant single chamber defibrillator, Doylene Canning. Ladona Ridgel MD     Family History  Problem Relation Age of Onset  . Dementia Mother   . Stroke Mother     Multiple  . Aneurysm Sister     Brain  . Uterine cancer Other     Grandmother  . Colon cancer Sister      History   Social History  . Marital Status: Legally Separated    Spouse Name: N/A    Number of Children: N/A  . Years of Education: N/A   Occupational History  . Disabled    Social History Main Topics  . Smoking status: Current Everyday Smoker -- 0.0 packs/day for 40 years    Types: Cigarettes  . Smokeless tobacco: Never Used   Comment: almost a pack a week  . Alcohol Use: No  . Drug  Use: No  . Sexually Active: Not on file   Other Topics Concern  . Not on file   Social History Narrative   Lives in [place] disability for her osteoarthritisRecently estranged from her husband     BP 121/86  Pulse 76  Ht 5\' 4"  (1.626 m)  Wt 185 lb (83.915 kg)  BMI 31.76 kg/m2  PF 121 L/min  Physical Exam:  Well appearing middle aged woman NAD HEENT: Unremarkable Neck:  No JVD, no thyromegally Lymphatics:  No adenopathy Back:  No CVA tenderness Lungs:  Clear. Well healed ICD incision. HEART:  Regular rate rhythm, no murmurs, no rubs, no clicks Abd:  soft, positive bowel sounds, no organomegally, no rebound, no guarding Ext:  2 plus pulses, no edema, no cyanosis, no clubbing Skin:  No rashes no nodules Neuro:  CN II through XII intact, motor grossly intact  DEVICE  Normal device function.  See PaceArt for details.   Assess/Plan:

## 2011-04-09 NOTE — Assessment & Plan Note (Signed)
Her device appears to be working normally. We will recheck in several months.

## 2011-04-14 ENCOUNTER — Other Ambulatory Visit: Payer: Self-pay | Admitting: *Deleted

## 2011-04-14 ENCOUNTER — Telehealth: Payer: Self-pay | Admitting: Internal Medicine

## 2011-04-14 MED ORDER — SIMVASTATIN 20 MG PO TABS: 20.0000 mg | ORAL_TABLET | Freq: Every day | ORAL | Status: DC

## 2011-04-14 MED ORDER — METOPROLOL SUCCINATE ER 50 MG PO TB24: 50.0000 mg | ORAL_TABLET | Freq: Every day | ORAL | Status: DC

## 2011-04-14 MED ORDER — METOPROLOL SUCCINATE ER 25 MG PO TB24: 25.0000 mg | ORAL_TABLET | Freq: Every day | ORAL | Status: DC

## 2011-04-14 NOTE — Telephone Encounter (Signed)
Patient would like a call in regards to her medications being called in.

## 2011-05-01 ENCOUNTER — Other Ambulatory Visit: Payer: Medicaid Other | Admitting: *Deleted

## 2011-05-12 ENCOUNTER — Telehealth: Payer: Self-pay

## 2011-05-12 MED ORDER — DIAZEPAM 10 MG PO TABS: 5.0000 mg | ORAL_TABLET | Freq: Three times a day (TID) | ORAL | Status: DC

## 2011-05-12 MED ORDER — PAROXETINE HCL 30 MG PO TABS: 60.0000 mg | ORAL_TABLET | ORAL | Status: DC

## 2011-05-12 NOTE — Telephone Encounter (Signed)
Patient calling to get a refill on valium & paroxetine to be sent to [facility] in [place].  She was told at last office visit when she gets her medicaid [place] from [place] [employer] need to have the psychologist follow these medications.  [facility] [place] but she will run out of the paroxetine & valium before then.    Per. Dr. Ladona Ridgel okay to refill this time.  Spoke with the pharmacist for refill on paroxetine & valium.  The patient is aware this will be the last time we refill this medications.

## 2011-05-27 ENCOUNTER — Other Ambulatory Visit: Payer: Self-pay | Admitting: Internal Medicine

## 2011-05-29 ENCOUNTER — Other Ambulatory Visit: Payer: Self-pay | Admitting: *Deleted

## 2011-05-29 MED ORDER — DIAZEPAM 10 MG PO TABS: 5.0000 mg | ORAL_TABLET | Freq: Three times a day (TID) | ORAL | Status: DC

## 2011-05-29 NOTE — Telephone Encounter (Signed)
Per 05/12/11 note Dr Ladona Ridgel is not going to fill this medication any more

## 2011-07-09 ENCOUNTER — Encounter: Payer: Medicaid Other | Admitting: Internal Medicine

## 2011-07-19 ENCOUNTER — Telehealth: Payer: Self-pay | Admitting: Physician Assistant

## 2011-07-19 NOTE — Telephone Encounter (Signed)
[name] is a 60 y.o. female who has a h/o long QT syndrome, VTach, s/p AICD and h/o parox AFib. She called in with palpitations and her HR was 107 and BP was 130/108. She has been told to take an extra 1/2 of Toprol when this happens. She did this about 45 mins before calling. The patient denies chest pain, shortness of breath, syncope, lightheadedness or dizziness. No ICD shocks. I advised her to wait one more hour.  If her HR did not slow down or she began to feel worse, she should go to the ED. I called her back later in the day and she noted she was much better.  No further palpitations and her BP was down. I will have Dr. Lubertha Basque nurse call her Monday to follow up and discuss with Dr. Ladona Ridgel to decide if she needs earlier follow up. Tereso Newcomer, PA-C

## 2011-07-22 NOTE — Telephone Encounter (Signed)
Called and left message on voicemail for patient to call me back if she is still having problems

## 2011-09-29 ENCOUNTER — Encounter: Payer: Self-pay | Admitting: *Deleted

## 2011-10-16 ENCOUNTER — Telehealth: Payer: Self-pay | Admitting: Internal Medicine

## 2011-10-16 NOTE — Telephone Encounter (Signed)
Did she give any number to call back because both in system are not in service. I do not see where anyone has sent in Rx and she has two Rx for Metoprolol one for succinate 25 and one for tartrate 50, and these were Rx by Dr. Ladona Ridgel. She has not been seen by him since 03/2011. And she has no showed or cancelled last 2 appts. I would say she needs an appt. Thanks.

## 2011-10-16 NOTE — Telephone Encounter (Signed)
Pt calling stating that she picked up her medication today that was 25mg  of toporol and she said that she was suppose to be on 50 mg. Pt doesn't have any minutes on her phone.

## 2011-10-19 ENCOUNTER — Inpatient Hospital Stay: Payer: Self-pay | Admitting: Internal Medicine

## 2011-10-19 LAB — COMPREHENSIVE METABOLIC PANEL
Albumin: 4 g/dL (ref 3.4–5.0)
Alkaline Phosphatase: 93 U/L (ref 50–136)
Anion Gap: 12 (ref 7–16)
Bilirubin,Total: 0.2 mg/dL (ref 0.2–1.0)
Calcium, Total: 9.3 mg/dL (ref 8.5–10.1)
Chloride: 102 mmol/L (ref 98–107)
Creatinine: 1.45 mg/dL — ABNORMAL HIGH (ref 0.60–1.30)
Glucose: 87 mg/dL (ref 65–99)
Osmolality: 274 (ref 275–301)
Potassium: 3.7 mmol/L (ref 3.5–5.1)
Sodium: 137 mmol/L (ref 136–145)
Total Protein: 7.4 g/dL (ref 6.4–8.2)

## 2011-10-19 LAB — CBC
HCT: 39.1 % (ref 35.0–47.0)
HGB: 13.2 g/dL (ref 12.0–16.0)
MCHC: 33.8 g/dL (ref 32.0–36.0)
RDW: 13.6 % (ref 11.5–14.5)
WBC: 8 10*3/uL (ref 3.6–11.0)

## 2011-10-19 LAB — CK TOTAL AND CKMB (NOT AT ARMC): CK-MB: <0.5 ng/mL — ABNORMAL LOW (ref 0.5–3.6)

## 2011-10-20 DIAGNOSIS — I4891 Unspecified atrial fibrillation: Secondary | ICD-10-CM

## 2011-10-20 LAB — CBC WITH DIFFERENTIAL/PLATELET
Basophil %: 0.7 %
Eosinophil #: 0.5 10*3/uL (ref 0.0–0.7)
Eosinophil %: 4.7 %
HCT: 41.7 % (ref 35.0–47.0)
Lymphocyte #: 3.9 10*3/uL — ABNORMAL HIGH (ref 1.0–3.6)
Lymphocyte %: 37.9 %
MCHC: 33.2 g/dL (ref 32.0–36.0)
MCV: 98 fL (ref 80–100)
Monocyte %: 6.9 %
Neutrophil #: 5.2 10*3/uL (ref 1.4–6.5)
Neutrophil %: 49.8 %
Platelet: 248 10*3/uL (ref 150–440)

## 2011-10-20 LAB — COMPREHENSIVE METABOLIC PANEL
Albumin: 3.9 g/dL (ref 3.4–5.0)
Alkaline Phosphatase: 69 U/L (ref 50–136)
Anion Gap: 12 (ref 7–16)
Bilirubin,Total: 0.3 mg/dL (ref 0.2–1.0)
Calcium, Total: 8.9 mg/dL (ref 8.5–10.1)
Chloride: 103 mmol/L (ref 98–107)
Creatinine: 1.5 mg/dL — ABNORMAL HIGH (ref 0.60–1.30)
Glucose: 112 mg/dL — ABNORMAL HIGH (ref 65–99)
Osmolality: 284 (ref 275–301)
Potassium: 3.4 mmol/L — ABNORMAL LOW (ref 3.5–5.1)
Sodium: 142 mmol/L (ref 136–145)

## 2011-10-20 LAB — MAGNESIUM: Magnesium: 2.5 mg/dL — ABNORMAL HIGH

## 2011-10-20 LAB — TROPONIN I
Troponin-I: <0.02 ng/mL
Troponin-I: <0.02 ng/mL

## 2011-10-20 LAB — TSH: Thyroid Stimulating Horm: 3.12 u[IU]/mL

## 2011-10-20 LAB — CK TOTAL AND CKMB (NOT AT ARMC)
CK, Total: 56 U/L (ref 21–215)
CK-MB: 0.5 ng/mL (ref 0.5–3.6)
CK-MB: 0.7 ng/mL (ref 0.5–3.6)

## 2011-10-21 NOTE — Telephone Encounter (Signed)
Pt was seen at [facility] and has a f/u from that. Will discuss meds then

## 2011-11-03 ENCOUNTER — Encounter: Payer: Self-pay | Admitting: *Deleted

## 2011-11-06 ENCOUNTER — Encounter: Payer: Self-pay | Admitting: Cardiovascular Disease

## 2011-11-06 ENCOUNTER — Encounter: Payer: Medicaid Other | Admitting: Cardiovascular Disease

## 2011-11-27 ENCOUNTER — Emergency Department: Payer: Self-pay | Admitting: *Deleted

## 2011-11-27 LAB — CBC
HCT: 41.4 % (ref 35.0–47.0)
Platelet: 255 10*3/uL (ref 150–440)
RDW: 12.4 % (ref 11.5–14.5)
WBC: 8.9 10*3/uL (ref 3.6–11.0)

## 2011-11-27 LAB — COMPREHENSIVE METABOLIC PANEL
Albumin: 3.8 g/dL (ref 3.4–5.0)
Anion Gap: 16 (ref 7–16)
Bilirubin,Total: 0.3 mg/dL (ref 0.2–1.0)
Chloride: 103 mmol/L (ref 98–107)
Co2: 21 mmol/L (ref 21–32)
Creatinine: 1.51 mg/dL — ABNORMAL HIGH (ref 0.60–1.30)
EGFR (African American): 45 — ABNORMAL LOW
EGFR (Non-African Amer.): 37 — ABNORMAL LOW
Osmolality: 283 (ref 275–301)
SGOT(AST): 32 U/L (ref 15–37)
SGPT (ALT): 36 U/L
Sodium: 140 mmol/L (ref 136–145)

## 2011-11-27 LAB — CK TOTAL AND CKMB (NOT AT ARMC)
CK, Total: 58 U/L (ref 21–215)
CK-MB: 0.8 ng/mL (ref 0.5–3.6)

## 2011-11-27 LAB — TROPONIN I
Troponin-I: <0.02 ng/mL
Troponin-I: <0.02 ng/mL

## 2011-11-28 ENCOUNTER — Encounter: Payer: Self-pay | Admitting: Internal Medicine

## 2011-12-04 ENCOUNTER — Other Ambulatory Visit: Payer: Self-pay | Admitting: *Deleted

## 2011-12-04 MED ORDER — SIMVASTATIN 20 MG PO TABS: 20.0000 mg | ORAL_TABLET | Freq: Every day | ORAL | Status: DC

## 2011-12-04 MED ORDER — METOPROLOL SUCCINATE ER 50 MG PO TB24: 50.0000 mg | ORAL_TABLET | Freq: Every day | ORAL | Status: DC

## 2011-12-04 NOTE — Telephone Encounter (Signed)
RX SENT IN TODAY 

## 2012-01-05 ENCOUNTER — Observation Stay: Payer: Self-pay | Admitting: Student

## 2012-01-05 LAB — LIPID PANEL
Cholesterol: 205 mg/dL — ABNORMAL HIGH (ref 0–200)
HDL Cholesterol: 39 mg/dL — ABNORMAL LOW (ref 40–60)
Ldl Cholesterol, Calc: 143 mg/dL — ABNORMAL HIGH (ref 0–100)
Triglycerides: 117 mg/dL (ref 0–200)
VLDL Cholesterol, Calc: 23 mg/dL (ref 5–40)

## 2012-01-05 LAB — COMPREHENSIVE METABOLIC PANEL
Albumin: 3.9 g/dL (ref 3.4–5.0)
Alkaline Phosphatase: 104 U/L (ref 50–136)
Anion Gap: 12 (ref 7–16)
Bilirubin,Total: 0.4 mg/dL (ref 0.2–1.0)
Calcium, Total: 9.8 mg/dL (ref 8.5–10.1)
Chloride: 102 mmol/L (ref 98–107)
EGFR (African American): 52 — ABNORMAL LOW
EGFR (Non-African Amer.): 45 — ABNORMAL LOW
Glucose: 133 mg/dL — ABNORMAL HIGH (ref 65–99)
Osmolality: 279 (ref 275–301)
Potassium: 3.2 mmol/L — ABNORMAL LOW (ref 3.5–5.1)
SGOT(AST): 30 U/L (ref 15–37)
SGPT (ALT): 33 U/L

## 2012-01-05 LAB — URINALYSIS, COMPLETE
Bilirubin,UR: NEGATIVE
Ph: 6 (ref 4.5–8.0)
Protein: 30

## 2012-01-05 LAB — CK-MB
CK-MB: 2.1 ng/mL (ref 0.5–3.6)
CK-MB: 2.2 ng/mL (ref 0.5–3.6)

## 2012-01-05 LAB — TROPONIN I
Troponin-I: <0.02 ng/mL
Troponin-I: <0.02 ng/mL

## 2012-01-05 LAB — CBC
HCT: 40.1 % (ref 35.0–47.0)
HGB: 13.6 g/dL (ref 12.0–16.0)
MCHC: 33.9 g/dL (ref 32.0–36.0)
Platelet: 284 10*3/uL (ref 150–440)
RBC: 4.19 10*6/uL (ref 3.80–5.20)
RDW: 12.4 % (ref 11.5–14.5)

## 2012-01-05 LAB — APTT: Activated PTT: 31.8 secs (ref 23.6–35.9)

## 2012-01-06 LAB — BASIC METABOLIC PANEL
Calcium, Total: 9 mg/dL (ref 8.5–10.1)
Co2: 27 mmol/L (ref 21–32)
EGFR (African American): 60 — ABNORMAL LOW
EGFR (Non-African Amer.): 51 — ABNORMAL LOW
Glucose: 88 mg/dL (ref 65–99)
Sodium: 142 mmol/L (ref 136–145)

## 2012-01-06 LAB — CBC WITH DIFFERENTIAL/PLATELET
Basophil #: 0.1 10*3/uL (ref 0.0–0.1)
Eosinophil #: 0.2 10*3/uL (ref 0.0–0.7)
HGB: 11.7 g/dL — ABNORMAL LOW (ref 12.0–16.0)
Lymphocyte %: 31.9 %
MCH: 31.7 pg (ref 26.0–34.0)
MCV: 96 fL (ref 80–100)
Monocyte %: 9 %
Neutrophil %: 56.5 %
Platelet: 228 10*3/uL (ref 150–440)
RBC: 3.7 10*6/uL — ABNORMAL LOW (ref 3.80–5.20)
RDW: 12.5 % (ref 11.5–14.5)
WBC: 8.8 10*3/uL (ref 3.6–11.0)

## 2012-01-06 LAB — MAGNESIUM: Magnesium: 1.9 mg/dL

## 2012-01-07 ENCOUNTER — Emergency Department: Payer: Self-pay | Admitting: Emergency Medicine

## 2012-01-07 LAB — CBC
HCT: 38.1 % (ref 35.0–47.0)
MCHC: 32.6 g/dL (ref 32.0–36.0)
Platelet: 268 10*3/uL (ref 150–440)
RBC: 4 10*6/uL (ref 3.80–5.20)

## 2012-01-07 LAB — COMPREHENSIVE METABOLIC PANEL
Alkaline Phosphatase: 103 U/L (ref 50–136)
Bilirubin,Total: 0.4 mg/dL (ref 0.2–1.0)
Chloride: 102 mmol/L (ref 98–107)
Co2: 25 mmol/L (ref 21–32)
Creatinine: 1.19 mg/dL (ref 0.60–1.30)
Osmolality: 272 (ref 275–301)
Potassium: 3.1 mmol/L — ABNORMAL LOW (ref 3.5–5.1)
SGOT(AST): 25 U/L (ref 15–37)
Sodium: 135 mmol/L — ABNORMAL LOW (ref 136–145)
Total Protein: 6.9 g/dL (ref 6.4–8.2)

## 2012-01-07 LAB — CK TOTAL AND CKMB (NOT AT ARMC): CK-MB: 0.9 ng/mL (ref 0.5–3.6)

## 2012-01-07 LAB — PROTIME-INR: INR: 0.9

## 2012-01-14 ENCOUNTER — Telehealth: Payer: Self-pay | Admitting: Physician Assistant

## 2012-01-14 NOTE — Telephone Encounter (Signed)
Pt called re: lack of Toprol 50mg  and Valium prescriptions. States that she has a follow-up appointment in [place] next week. Has been in the hospital frequently for paroxysmal atrial fibrillation. Denies complaints of increased palpitations, lightheadedness, shortness of breath or chest pain otherwise. Will call and give 7-day prescriptions of each. Refills to be determined at her upcoming follow-up appointment.    Jacqulyn Bath, PA-C 01/14/2012 6:44 PM

## 2012-01-17 ENCOUNTER — Emergency Department: Payer: Self-pay | Admitting: Emergency Medicine

## 2012-01-17 LAB — TROPONIN I: Troponin-I: <0.02 ng/mL

## 2012-01-17 LAB — COMPREHENSIVE METABOLIC PANEL
Alkaline Phosphatase: 110 U/L (ref 50–136)
Anion Gap: 12 (ref 7–16)
Calcium, Total: 10 mg/dL (ref 8.5–10.1)
Chloride: 103 mmol/L (ref 98–107)
EGFR (African American): 41 — ABNORMAL LOW
Glucose: 110 mg/dL — ABNORMAL HIGH (ref 65–99)
Potassium: 3 mmol/L — ABNORMAL LOW (ref 3.5–5.1)
SGOT(AST): 26 U/L (ref 15–37)
SGPT (ALT): 34 U/L
Total Protein: 7.5 g/dL (ref 6.4–8.2)

## 2012-01-17 LAB — CBC
HCT: 41.4 % (ref 35.0–47.0)
Platelet: 314 10*3/uL (ref 150–440)
RBC: 4.34 10*6/uL (ref 3.80–5.20)
RDW: 12.3 % (ref 11.5–14.5)
WBC: 12.8 10*3/uL — ABNORMAL HIGH (ref 3.6–11.0)

## 2012-01-17 LAB — CK TOTAL AND CKMB (NOT AT ARMC): CK, Total: 93 U/L (ref 21–215)

## 2012-01-19 ENCOUNTER — Encounter: Payer: Self-pay | Admitting: Cardiovascular Disease

## 2012-01-19 ENCOUNTER — Ambulatory Visit (INDEPENDENT_AMBULATORY_CARE_PROVIDER_SITE_OTHER): Payer: Self-pay | Admitting: Cardiovascular Disease

## 2012-01-19 VITALS — BP 128/78 | HR 60 | Ht 64.0 in | Wt 171.0 lb

## 2012-01-19 DIAGNOSIS — I2510 Atherosclerotic heart disease of native coronary artery without angina pectoris: Secondary | ICD-10-CM

## 2012-01-19 DIAGNOSIS — F411 Generalized anxiety disorder: Secondary | ICD-10-CM

## 2012-01-19 DIAGNOSIS — I4891 Unspecified atrial fibrillation: Secondary | ICD-10-CM

## 2012-01-19 DIAGNOSIS — I469 Cardiac arrest, cause unspecified: Secondary | ICD-10-CM

## 2012-01-19 DIAGNOSIS — Z95810 Presence of automatic (implantable) cardiac defibrillator: Secondary | ICD-10-CM

## 2012-01-19 MED ORDER — DIAZEPAM 5 MG PO TABS: 5.0000 mg | ORAL_TABLET | Freq: Three times a day (TID) | ORAL | Status: AC

## 2012-01-19 MED ORDER — METOPROLOL TARTRATE 25 MG PO TABS: 25.0000 mg | ORAL_TABLET | Freq: Two times a day (BID) | ORAL | Status: DC

## 2012-01-19 NOTE — Assessment & Plan Note (Signed)
The patient is having increased frequency of atrial fibrillation which required 2 hospitalizations over the last 2 months and another emergency room visit. She is now on Toprol 50 mg once daily. I will switch this to metoprolol tartrate 25 mg twice daily and asked her to use an extra tablet as needed for breakthrough arrhythmia. Her chads score is low and thus I recommend continuing aspirin daily. Due to increased frequency of atrial fibrillation, an antiarrhythmic medication is likely needed. I will request an echocardiogram to further evaluate her LV systolic function and atrial size.  She has reported history of prolonged QT and that might affect the decision of choosing an antiarrhythmic medication. Thus, I will have her see Dr. Ladona Ridgel to help in the decision. Given her recent hypokalemia, I will request basic metabolic profile and also will check her thyroid function.

## 2012-01-19 NOTE — Progress Notes (Signed)
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 She also has history of paroxysmal atrial fibrillation the last 3 years. She has been treated with metoprolol. She had increased frequency of atrial fibrillation recently. She was hospitalized in March due to atrial fibrillation with rapid ventricular response. I reviewed her ECG from that admission and it appears that it's likely was atypical atrial flutter with 2 to one block. It was felt at that time that the episode was due to noncompliance with treatment. She was switched to metoprolol tartrate. She was hospitalized again in April due to atrial fibrillation with rapid ventricular response. She converted to sinus rhythm with Cardizem. According to the patient she has been taking the medication regularly. She has been having problems affording other medications but according to her she always gets her metoprolol. In spite of that, she had another emergency room visit last week due to A. fib with RVR. Again converted with diltiazem IV. Her potassium was noted to be low and she was given potassium chloride. She has been having increased anxiety recently.     Allergies  Allergen Reactions  . Codeine   . Propoxacet-N      Current Outpatient Prescriptions on File Prior to Visit  Medication Sig Dispense Refill  . albuterol (VENTOLIN HFA) 108 (90 BASE) MCG/ACT inhaler Inhale 2 puffs into the lungs every 6 (six) hours as needed.        Marland Kitchen aspirin 325 MG tablet Take 325 mg by mouth daily.        Marland Kitchen atorvastatin (LIPITOR) 40 MG tablet Take 40 mg by mouth daily.       . diphenhydrAMINE (BENADRYL) 25 MG tablet Take 25 mg by mouth every 6 (six) hours as needed.        . diphenhydramine-acetaminophen (TYLENOL PM) 25-500 MG TABS Take 1 tablet by mouth at bedtime as needed.      . Fluticasone-Salmeterol (ADVAIR DISKUS IN) Inhale into the lungs.       Marland Kitchen PARoxetine (PAXIL) 30 MG tablet Take 2  tablets (60 mg total) by mouth every morning.  60 tablet  0  . metoprolol tartrate (LOPRESSOR) 25 MG tablet Take 1 tablet (25 mg total) by mouth 2 (two) times daily.  60 tablet  11  . simvastatin (ZOCOR) 20 MG tablet Take 1 tablet (20 mg total) by mouth at bedtime.  30 tablet  0     Past Medical History  Diagnosis Date  . Coronary artery disease     non obstructive  . Stroke   . Seizures   . Anxiety   . Depression   . Migraine   . Fibromuscular dysplasia   . Tobacco abuse   . COPD (chronic obstructive pulmonary disease)   . Arthritis of knee     Bilateral  . Tobacco abuse   . Migraine headache   . Anxiety disorder   . Depression   . Seizure disorder   . Cerebrovascular disease   . CAD (coronary artery disease)   . Automatic implantable cardiac defibrillator in situ   . HTN (hypertension)   . HLD (hyperlipidemia)   . Ventricular fibrillation   . Long QT syndrome   . Cardiac arrest      Past Surgical History  Procedure Date  . Pacemaker insertion 05/01/2003    ICD, implantation of a Guidant single chamber defibrillator, Doylene Canning. Ladona Ridgel MD     Family History  Problem Relation Age of Onset  . Dementia Mother   . Stroke Mother     Multiple  . Aneurysm Sister     Brain  . Uterine cancer Other     Grandmother  . Colon cancer Sister      History   Social History  . Marital Status: Legally Separated    Spouse Name: N/A    Number of Children: N/A  . Years of Education: N/A   Occupational History  . Disabled    Social History Main Topics  . Smoking status: Current Everyday Smoker -- 0.0 packs/day for 40 years    Types: Cigarettes  . Smokeless tobacco: Never Used   Comment: almost a pack a week  . Alcohol Use: No  . Drug Use: No  . Sexually Active: Not on file   Other Topics Concern  . Not on file   Social History Narrative   [place] for her osteoarthritisRecently estranged from her husband      PHYSICAL EXAM   BP  128/78  Pulse 60  Ht 5\' 4"  (1.626 m)  Wt 171 lb (77.565 kg)  BMI 29.35 kg/m2 Constitutional: She is oriented to person, place, and time. She appears well-developed and well-nourished. No distress.  HENT: No nasal discharge.  Head: Normocephalic and atraumatic.  Eyes: Pupils are equal and round. Right eye exhibits no discharge. Left eye exhibits no discharge.  Neck: Normal range of motion. Neck supple. No JVD present. No thyromegaly present.  Cardiovascular: Normal rate, regular rhythm, normal heart sounds. Exam reveals no gallop and no friction rub. No murmur heard.  Pulmonary/Chest: Effort normal and breath sounds normal. No stridor. No respiratory distress. She has no wheezes. She has no rales. She exhibits no tenderness.  Abdominal: Soft. Bowel sounds are normal. She exhibits no distension. There is no tenderness. There is no rebound and no guarding.  Musculoskeletal: Normal range of motion. She exhibits no edema and no tenderness.  Neurological: She is alert and oriented to person, place, and time. Coordination normal.  Skin: Skin is warm and dry. No rash noted. She is not diaphoretic. No erythema. No pallor.  Psychiatric: She has a normal mood and affect. Her behavior is normal. Judgment and thought content normal.     EKG: Normal sinus rhythm with first degree AV block. Poor R wave progression in the anterior leads. Heart rate is 60 beats per minute.  ASSESSMENT AND PLAN

## 2012-01-19 NOTE — Patient Instructions (Addendum)
Labs to be done today.  Your physician has requested that you have an echocardiogram. Echocardiography is a painless test that uses sound waves to create images of your heart. It provides your doctor with information about the size and shape of your heart and how well your heart's chambers and valves are working. This procedure takes approximately one hour. There are no restrictions for this procedure.  Schedule an appointment with Dr. Ladona Ridgel after echocardiogram.  Follow up in 4 months with [place].

## 2012-01-19 NOTE — Assessment & Plan Note (Signed)
No ICD shocks. Followup with Dr. Ladona Ridgel.

## 2012-01-19 NOTE — Assessment & Plan Note (Signed)
I gave her a one-time refill on Valium but explained to her that she needs to followup with her primary care physician regarding this. She has an appointment around mid-May. No further refills will be provided as was discussed with her.

## 2012-01-20 LAB — BASIC METABOLIC PANEL
BUN: 16 mg/dL (ref 8–27)
Calcium: 9.9 mg/dL (ref 8.6–10.2)
Chloride: 103 mmol/L (ref 97–108)
GFR calc non Af Amer: 56 mL/min/{1.73_m2} — ABNORMAL LOW (ref >59)
Glucose: 97 mg/dL (ref 65–99)
Potassium: 3.8 mmol/L (ref 3.5–5.2)

## 2012-01-20 LAB — TSH: TSH: 0.628 u[IU]/mL (ref 0.450–4.500)

## 2012-01-21 ENCOUNTER — Telehealth: Payer: Self-pay | Admitting: Cardiology

## 2012-01-21 NOTE — Telephone Encounter (Signed)
Unable to find a reason she was called.  I notified her that I will call her back.

## 2012-01-21 NOTE — Telephone Encounter (Signed)
Fu msg Pt is returning your call 

## 2012-02-11 ENCOUNTER — Telehealth: Payer: Self-pay | Admitting: Physician Assistant

## 2012-02-11 NOTE — Telephone Encounter (Signed)
Patient called re: Valium refill. Reviewing prior notes, she has called about this before. I gave her a 7-day supply until she followed-up with Dr. Kirke Corin on 01/19/12. His office note mentions that he gave her a refill as well, and that we would not be providing this medication in the future. I re-enforced this and advised to contact the mental health clinic where she will be see in June to potentially get a follow-up that is sooner. This should preferably be managed by her PCP or by behavioral health. She understood and agreed.   Jacqulyn Bath, PA-C 02/11/2012 7:22 PM

## 2012-02-17 ENCOUNTER — Other Ambulatory Visit: Payer: Self-pay

## 2012-03-06 ENCOUNTER — Telehealth: Payer: Self-pay | Admitting: Cardiology

## 2012-03-06 NOTE — Telephone Encounter (Signed)
Attempted to return two pages to [name], but the calls went straight to voicemail. I left two messages asking her to give a number where she could be reached.  She called again and requested a refill on her antidepressant medication and valium because she is "in between doctors" as she is moving from [place] to [place]. I told her that per Dr. Jari Sportsman last note on 4/29 we were not going to provide her with refills of these medications any longer and she will need to establish care with a primary care provider. She stated understanding.  Brittney Gehring PA-C 03/06/2012 1:58 PM

## 2012-04-24 ENCOUNTER — Telehealth: Payer: Self-pay | Admitting: Physician Assistant

## 2012-04-24 NOTE — Telephone Encounter (Signed)
Patient called in Saturday evening at 8pm requesting a prescription for Valium. She stated her aunt had died and said she had a follow-up appointment scheduled with a primary care doctor in the next week. We do not have access to confirm that appointment in Epic. I offered my condolences to her aunt's passing. Note that in 12/2011 she was told by our office that we would not refill this medicine, 01/2012 by telephone communication, and again in 02/2012 by after-hours phone call. I told her as a cardiology office we generally do not call in this medicine, particularly after hours. I reinforced the previous information given to her about establishing with a primary doctor. She verbalized understanding.

## 2012-04-27 ENCOUNTER — Emergency Department: Payer: Self-pay | Admitting: Emergency Medicine

## 2012-04-27 LAB — CBC
HCT: 38 % (ref 35.0–47.0)
HGB: 12.9 g/dL (ref 12.0–16.0)
MCV: 94 fL (ref 80–100)
RBC: 4.06 10*6/uL (ref 3.80–5.20)
RDW: 14 % (ref 11.5–14.5)
WBC: 8.6 10*3/uL (ref 3.6–11.0)

## 2012-04-28 LAB — COMPREHENSIVE METABOLIC PANEL
Albumin: 3.5 g/dL (ref 3.4–5.0)
Anion Gap: 8 (ref 7–16)
Calcium, Total: 9.4 mg/dL (ref 8.5–10.1)
Chloride: 108 mmol/L — ABNORMAL HIGH (ref 98–107)
Co2: 25 mmol/L (ref 21–32)
Creatinine: 1.4 mg/dL — ABNORMAL HIGH (ref 0.60–1.30)
EGFR (African American): 47 — ABNORMAL LOW
EGFR (Non-African Amer.): 40 — ABNORMAL LOW
Glucose: 92 mg/dL (ref 65–99)
Potassium: 3.6 mmol/L (ref 3.5–5.1)
SGOT(AST): 25 U/L (ref 15–37)

## 2012-04-28 LAB — URINALYSIS, COMPLETE
Ketone: NEGATIVE
Protein: NEGATIVE
RBC,UR: 2 /HPF (ref 0–5)
Specific Gravity: 1.024 (ref 1.003–1.030)
Squamous Epithelial: 3
WBC UR: 6 /HPF (ref 0–5)

## 2012-04-28 LAB — DRUG SCREEN, URINE
Amphetamines, Ur Screen: NEGATIVE (ref ?–1000)
Barbiturates, Ur Screen: NEGATIVE (ref ?–200)
Benzodiazepine, Ur Scrn: POSITIVE (ref ?–200)
Methadone, Ur Screen: NEGATIVE (ref ?–300)
Phencyclidine (PCP) Ur S: NEGATIVE (ref ?–25)
Tricyclic, Ur Screen: NEGATIVE (ref ?–1000)

## 2012-04-28 LAB — TSH: Thyroid Stimulating Horm: 1.84 u[IU]/mL

## 2012-04-28 LAB — TROPONIN I: Troponin-I: <0.02 ng/mL

## 2012-06-08 ENCOUNTER — Ambulatory Visit: Payer: Self-pay | Admitting: Internal Medicine

## 2012-06-17 ENCOUNTER — Ambulatory Visit: Payer: Self-pay | Admitting: Internal Medicine

## 2012-07-20 ENCOUNTER — Encounter (HOSPITAL_BASED_OUTPATIENT_CLINIC_OR_DEPARTMENT_OTHER): Payer: Self-pay | Admitting: *Deleted

## 2012-07-20 ENCOUNTER — Emergency Department (HOSPITAL_BASED_OUTPATIENT_CLINIC_OR_DEPARTMENT_OTHER)
Admission: EM | Admit: 2012-07-20 | Discharge: 2012-07-20 | Disposition: A | Discharge status: Home / Self Care | Payer: Self-pay | Attending: Emergency Medicine | Admitting: Emergency Medicine

## 2012-07-20 ENCOUNTER — Emergency Department (HOSPITAL_BASED_OUTPATIENT_CLINIC_OR_DEPARTMENT_OTHER): Payer: Self-pay

## 2012-07-20 DIAGNOSIS — R6883 Chills (without fever): Secondary | ICD-10-CM | POA: Insufficient documentation

## 2012-07-20 DIAGNOSIS — I10 Essential (primary) hypertension: Secondary | ICD-10-CM | POA: Insufficient documentation

## 2012-07-20 DIAGNOSIS — I2510 Atherosclerotic heart disease of native coronary artery without angina pectoris: Secondary | ICD-10-CM | POA: Insufficient documentation

## 2012-07-20 DIAGNOSIS — G43909 Migraine, unspecified, not intractable, without status migrainosus: Secondary | ICD-10-CM | POA: Insufficient documentation

## 2012-07-20 DIAGNOSIS — Z95810 Presence of automatic (implantable) cardiac defibrillator: Secondary | ICD-10-CM | POA: Insufficient documentation

## 2012-07-20 DIAGNOSIS — Z79899 Other long term (current) drug therapy: Secondary | ICD-10-CM | POA: Insufficient documentation

## 2012-07-20 DIAGNOSIS — G40802 Other epilepsy, not intractable, without status epilepticus: Secondary | ICD-10-CM | POA: Insufficient documentation

## 2012-07-20 DIAGNOSIS — I7789 Other specified disorders of arteries and arterioles: Secondary | ICD-10-CM | POA: Insufficient documentation

## 2012-07-20 DIAGNOSIS — I4901 Ventricular fibrillation: Secondary | ICD-10-CM | POA: Insufficient documentation

## 2012-07-20 DIAGNOSIS — M1710 Unilateral primary osteoarthritis, unspecified knee: Secondary | ICD-10-CM | POA: Insufficient documentation

## 2012-07-20 DIAGNOSIS — J449 Chronic obstructive pulmonary disease, unspecified: Secondary | ICD-10-CM | POA: Insufficient documentation

## 2012-07-20 DIAGNOSIS — E876 Hypokalemia: Secondary | ICD-10-CM | POA: Insufficient documentation

## 2012-07-20 DIAGNOSIS — Z7982 Long term (current) use of aspirin: Secondary | ICD-10-CM | POA: Insufficient documentation

## 2012-07-20 DIAGNOSIS — E785 Hyperlipidemia, unspecified: Secondary | ICD-10-CM | POA: Insufficient documentation

## 2012-07-20 DIAGNOSIS — R05 Cough: Secondary | ICD-10-CM | POA: Insufficient documentation

## 2012-07-20 DIAGNOSIS — I4891 Unspecified atrial fibrillation: Secondary | ICD-10-CM | POA: Insufficient documentation

## 2012-07-20 DIAGNOSIS — F411 Generalized anxiety disorder: Secondary | ICD-10-CM | POA: Insufficient documentation

## 2012-07-20 DIAGNOSIS — Z950 Presence of cardiac pacemaker: Secondary | ICD-10-CM | POA: Insufficient documentation

## 2012-07-20 DIAGNOSIS — G43109 Migraine with aura, not intractable, without status migrainosus: Secondary | ICD-10-CM | POA: Insufficient documentation

## 2012-07-20 DIAGNOSIS — I4581 Long QT syndrome: Secondary | ICD-10-CM | POA: Insufficient documentation

## 2012-07-20 DIAGNOSIS — R197 Diarrhea, unspecified: Secondary | ICD-10-CM | POA: Insufficient documentation

## 2012-07-20 DIAGNOSIS — F3289 Other specified depressive episodes: Secondary | ICD-10-CM | POA: Insufficient documentation

## 2012-07-20 DIAGNOSIS — Z8674 Personal history of sudden cardiac arrest: Secondary | ICD-10-CM | POA: Insufficient documentation

## 2012-07-20 DIAGNOSIS — R059 Cough, unspecified: Secondary | ICD-10-CM | POA: Insufficient documentation

## 2012-07-20 DIAGNOSIS — F329 Major depressive disorder, single episode, unspecified: Secondary | ICD-10-CM | POA: Insufficient documentation

## 2012-07-20 DIAGNOSIS — Z8673 Personal history of transient ischemic attack (TIA), and cerebral infarction without residual deficits: Secondary | ICD-10-CM | POA: Insufficient documentation

## 2012-07-20 DIAGNOSIS — J4489 Other specified chronic obstructive pulmonary disease: Secondary | ICD-10-CM | POA: Insufficient documentation

## 2012-07-20 DIAGNOSIS — F17200 Nicotine dependence, unspecified, uncomplicated: Secondary | ICD-10-CM | POA: Insufficient documentation

## 2012-07-20 DIAGNOSIS — Z823 Family history of stroke: Secondary | ICD-10-CM | POA: Insufficient documentation

## 2012-07-20 LAB — URINALYSIS, ROUTINE W REFLEX MICROSCOPIC
Bilirubin Urine: NEGATIVE
Glucose, UA: NEGATIVE mg/dL
Hgb urine dipstick: NEGATIVE
Specific Gravity, Urine: 1.019 (ref 1.005–1.030)
Urobilinogen, UA: 0.2 mg/dL (ref 0.0–1.0)
pH: 6 (ref 5.0–8.0)

## 2012-07-20 LAB — BASIC METABOLIC PANEL
BUN: 14 mg/dL (ref 6–23)
CO2: 23 mEq/L (ref 19–32)
Calcium: 10 mg/dL (ref 8.4–10.5)
Glucose, Bld: 115 mg/dL — ABNORMAL HIGH (ref 70–99)
Sodium: 137 mEq/L (ref 135–145)

## 2012-07-20 LAB — CBC WITH DIFFERENTIAL/PLATELET
Eosinophils Relative: 3 % (ref 0–5)
HCT: 38.9 % (ref 36.0–46.0)
Hemoglobin: 13.7 g/dL (ref 12.0–15.0)
Lymphocytes Relative: 37 % (ref 12–46)
Lymphs Abs: 2.8 10*3/uL (ref 0.7–4.0)
MCH: 32.1 pg (ref 26.0–34.0)
MCV: 91.1 fL (ref 78.0–100.0)
Monocytes Relative: 8 % (ref 3–12)
Platelets: 265 10*3/uL (ref 150–400)
RBC: 4.27 MIL/uL (ref 3.87–5.11)
WBC: 7.5 10*3/uL (ref 4.0–10.5)

## 2012-07-20 MED ORDER — DIAZEPAM 5 MG PO TABS: 5.0000 mg | ORAL_TABLET | Freq: Two times a day (BID) | ORAL | Status: DC

## 2012-07-20 MED ORDER — DIAZEPAM 5 MG PO TABS
5.0000 mg | ORAL_TABLET | Freq: Once | ORAL | Status: AC
  Administered 2012-07-20: 5 mg via ORAL
  Filled 2012-07-20: qty 1

## 2012-07-20 MED ORDER — POTASSIUM CHLORIDE CRYS ER 20 MEQ PO TBCR: 20.0000 meq | EXTENDED_RELEASE_TABLET | Freq: Two times a day (BID) | ORAL | Status: DC

## 2012-07-20 MED ORDER — POTASSIUM CHLORIDE CRYS ER 20 MEQ PO TBCR
40.0000 meq | EXTENDED_RELEASE_TABLET | Freq: Once | ORAL | Status: AC
  Administered 2012-07-20: 40 meq via ORAL
  Filled 2012-07-20: qty 2

## 2012-07-20 NOTE — ED Provider Notes (Addendum)
History     CSN: [id number]  Arrival date & time 07/20/12  0909   First MD Initiated Contact with Patient 07/20/12 1013      Chief Complaint  Patient presents with  . Atrial Fibrillation    (Consider location/radiation/quality/duration/timing/severity/associated sxs/prior treatment) Patient is a 61 y.o. female presenting with atrial fibrillation. The history is provided by the patient and the EMS personnel.  Atrial Fibrillation Pertinent negatives include no chest pain, no abdominal pain, no headaches and no shortness of breath.   onset of rapid heart beat at 3:30 in the morning resolve shortly before arrival here. Patient has had viral upper respiratory or viral illness type symptoms for the past few days her low bit of diarrhea last night no chest pain. Patient has a known history of atrial fibrillation followed by cardiology he is on beta blockers. Patient has also been feeling more anxious than usual because she has run out of her Valium has a history of anxiety. No chest pain. Palpitations were consistent with a rapid heart beat. Reminded her of her past problems with her atrial fib. Patient does have an implantable ICD defibrillator. That has not gone off.  Past Medical History  Diagnosis Date  . Coronary artery disease     non obstructive  . Stroke   . Seizures   . Anxiety   . Depression   . Migraine   . Fibromuscular dysplasia   . Tobacco abuse   . COPD (chronic obstructive pulmonary disease)   . Arthritis of knee     Bilateral  . Tobacco abuse   . Migraine headache   . Anxiety disorder   . Depression   . Seizure disorder   . Cerebrovascular disease   . CAD (coronary artery disease)   . Automatic implantable cardiac defibrillator in situ   . HTN (hypertension)   . HLD (hyperlipidemia)   . Ventricular fibrillation   . Long QT syndrome   . Cardiac arrest     Past Surgical History  Procedure Date  . Pacemaker insertion 05/01/2003    ICD, implantation of a  Guidant single chamber defibrillator, Doylene Canning. Ladona Ridgel MD  . Cholecystectomy   . Tubal ligation   . Breast lumpectomy     Family History  Problem Relation Age of Onset  . Dementia Mother   . Stroke Mother     Multiple  . Aneurysm Sister     Brain  . Uterine cancer Other     Grandmother  . Colon cancer Sister     History  Substance Use Topics  . Smoking status: Current Some Day Smoker -- 0.0 packs/day for 40 years    Types: Cigarettes  . Smokeless tobacco: Never Used   Comment: almost a pack a week  . Alcohol Use: No    OB History    Grav Para Term Preterm Abortions TAB SAB Ect Mult Living                  Review of Systems  Constitutional: Positive for chills. Negative for fever.  HENT: Negative for neck pain.   Respiratory: Positive for cough. Negative for shortness of breath.   Cardiovascular: Positive for palpitations. Negative for chest pain.  Gastrointestinal: Positive for diarrhea. Negative for nausea, vomiting and abdominal pain.  Genitourinary: Negative for dysuria.  Musculoskeletal: Positive for myalgias. Negative for back pain.  Skin: Negative for rash.  Neurological: Negative for headaches.  Hematological: Does not bruise/bleed easily.    Allergies  Azithromycin; Codeine;  Demerol; Ibuprofen; and Propoxyphene-acetaminophen  Home Medications   Current Outpatient Rx  Name Route Sig Dispense Refill  . SIMVASTATIN 40 MG PO TABS Oral Take 40 mg by mouth every evening. Currently out of pills    . ALBUTEROL SULFATE HFA 108 (90 BASE) MCG/ACT IN AERS Inhalation Inhale 2 puffs into the lungs every 6 (six) hours as needed.      . ASPIRIN 325 MG PO TABS Oral Take 325 mg by mouth daily.      . ATORVASTATIN CALCIUM 40 MG PO TABS Oral Take 40 mg by mouth daily.     Marland Kitchen DIAZEPAM 5 MG PO TABS Oral Take 1 tablet (5 mg total) by mouth 2 (two) times daily. 15 tablet 0  . DIPHENHYDRAMINE HCL 25 MG PO TABS Oral Take 25 mg by mouth every 6 (six) hours as needed.      Marland Kitchen  DIPHENHYDRAMINE-APAP (SLEEP) 25-500 MG PO TABS Oral Take 1 tablet by mouth at bedtime as needed.    Marland Kitchen ADVAIR DISKUS IN Inhalation Inhale into the lungs.     Marland Kitchen METOPROLOL TARTRATE 25 MG PO TABS Oral Take 1 tablet (25 mg total) by mouth 2 (two) times daily. 60 tablet 11  . PAROXETINE HCL 30 MG PO TABS Oral Take 2 tablets (60 mg total) by mouth every morning. 60 tablet 0  . POTASSIUM CHLORIDE CRYS ER 20 MEQ PO TBCR Oral Take 1 tablet (20 mEq total) by mouth 2 (two) times daily. 10 tablet 0  . SIMVASTATIN 20 MG PO TABS Oral Take 1 tablet (20 mg total) by mouth at bedtime. 30 tablet 0    BP 124/75  Pulse 65  Temp 98.1 F (36.7 C) (Oral)  Resp 13  SpO2 98%  Physical Exam  Nursing note and vitals reviewed. Constitutional: She is oriented to person, place, and time. She appears well-developed and well-nourished. No distress.  HENT:  Head: Normocephalic and atraumatic.  Mouth/Throat: Oropharynx is clear and moist.  Eyes: Conjunctivae normal and EOM are normal. Pupils are equal, round, and reactive to light.  Neck: Normal range of motion. Neck supple.  Cardiovascular: Normal rate, regular rhythm and normal heart sounds.   No murmur heard. Pulmonary/Chest: Effort normal and breath sounds normal. No respiratory distress. She has no wheezes. She has no rales.  Abdominal: Soft. Bowel sounds are normal. There is no tenderness.  Musculoskeletal: Normal range of motion. She exhibits no edema.  Neurological: She is alert and oriented to person, place, and time. No cranial nerve deficit. She exhibits normal muscle tone. Coordination normal.  Skin: Skin is warm. No rash noted.    ED Course  Procedures (including critical care time)  Labs Reviewed  BASIC METABOLIC PANEL - Abnormal; Notable for the following:    Potassium 3.2 (*)     Glucose, Bld 115 (*)     GFR calc non Af Amer 53 (*)     GFR calc Af Amer 61 (*)     All other components within normal limits  URINALYSIS, ROUTINE W REFLEX  MICROSCOPIC  CBC WITH DIFFERENTIAL   Dg Chest 2 View  07/20/2012  *RADIOLOGY REPORT*  Clinical Data: Cough, congestion, atrial fibrillation.  CHEST - 2 VIEW  Comparison: March 17, 2011.  Findings: Left-sided pacemaker is unchanged in position. Cardiomediastinal silhouette appears normal.  No acute pulmonary disease is noted. Bony thorax is intact.  IMPRESSION: No acute cardiopulmonary abnormality seen.   Original Report Authenticated By: Venita Sheffield., M.D.    Results for  orders placed during the hospital encounter of 07/20/12  URINALYSIS, ROUTINE W REFLEX MICROSCOPIC      Component Value Range   Color, Urine YELLOW  YELLOW   APPearance CLEAR  CLEAR   Specific Gravity, Urine 1.019  1.005 - 1.030   pH 6.0  5.0 - 8.0   Glucose, UA NEGATIVE  NEGATIVE mg/dL   Hgb urine dipstick NEGATIVE  NEGATIVE   Bilirubin Urine NEGATIVE  NEGATIVE   Ketones, ur NEGATIVE  NEGATIVE mg/dL   Protein, ur NEGATIVE  NEGATIVE mg/dL   Urobilinogen, UA 0.2  0.0 - 1.0 mg/dL   Nitrite NEGATIVE  NEGATIVE   Leukocytes, UA NEGATIVE  NEGATIVE  CBC WITH DIFFERENTIAL      Component Value Range   WBC 7.5  4.0 - 10.5 K/uL   RBC 4.27  3.87 - 5.11 MIL/uL   Hemoglobin 13.7  12.0 - 15.0 g/dL   HCT 16.1  09.6 - 04.5 %   MCV 91.1  78.0 - 100.0 fL   MCH 32.1  26.0 - 34.0 pg   MCHC 35.2  30.0 - 36.0 g/dL   RDW 40.9  81.1 - 91.4 %   Platelets 265  150 - 400 K/uL   Neutrophils Relative 51  43 - 77 %   Neutro Abs 3.8  1.7 - 7.7 K/uL   Lymphocytes Relative 37  12 - 46 %   Lymphs Abs 2.8  0.7 - 4.0 K/uL   Monocytes Relative 8  3 - 12 %   Monocytes Absolute 0.6  0.1 - 1.0 K/uL   Eosinophils Relative 3  0 - 5 %   Eosinophils Absolute 0.2  0.0 - 0.7 K/uL   Basophils Relative 1  0 - 1 %   Basophils Absolute 0.1  0.0 - 0.1 K/uL  BASIC METABOLIC PANEL      Component Value Range   Sodium 137  135 - 145 mEq/L   Potassium 3.2 (*) 3.5 - 5.1 mEq/L   Chloride 101  96 - 112 mEq/L   CO2 23  19 - 32 mEq/L   Glucose, Bld 115 (*)  70 - 99 mg/dL   BUN 14  6 - 23 mg/dL   Creatinine, Ser 7.82  0.50 - 1.10 mg/dL   Calcium 95.6  8.4 - 21.3 mg/dL   GFR calc non Af Amer 53 (*) >90 mL/min   GFR calc Af Amer 61 (*) >90 mL/min    Date: 07/20/2012  Rate: 76  Rhythm: normal sinus rhythm  QRS Axis: normal  Intervals: normal  ST/T Wave abnormalities: nonspecific ST/T changes  Conduction Disutrbances:first-degree A-V block   Narrative Interpretation:   Old EKG Reviewed: unchanged From 01/16/11   1. Atrial fibrillation   2. ANXIETY DISORDER   3. Hypokalemia       MDM  Patient with known atrial fibrillation. Followed by [facility] cardiology, specifically Dr. Ladona Ridgel. Patient with onset of rapid atrial fib at 3:30 in the morning but are resolved shortly before arrival here. Patient did take a next her her beta blocker felt as rectum by her cardiologist and it seems to have helped. Patient also has a history of anxiety she is out of her Valium. And she has had a history of low potassium in the past. Today's potassium was low given 40 mEq of potassium in the emergency department we sent home with potassium supplements for the next 5 days. Patient will call her cardiologist for followup. Patient does have an implantable ICD defibrillator her EKG here  is a normal sinus rhythm with first degree AV block T wave inversion laterally but not new as compared to April 2012. Patient did not have any chest pain.        Shelda Jakes, MD 07/20/12 1131  Shelda Jakes, MD 07/20/12 2259

## 2012-07-20 NOTE — ED Notes (Signed)
Patient states that she had viral symptoms yesterday, chills, non-productive cough, weakness, diarrhea last night, no appetite. Called EMS because she states she felt as if she had afib around 3am.. States that he has not been eating properly because her benefits check has not come from the government. C/O generalized weakness/congestion at this time. Currently out out of simvastatin & diazepam

## 2012-09-10 ENCOUNTER — Encounter (HOSPITAL_BASED_OUTPATIENT_CLINIC_OR_DEPARTMENT_OTHER): Payer: Self-pay | Admitting: Emergency Medicine

## 2012-09-10 ENCOUNTER — Inpatient Hospital Stay (HOSPITAL_BASED_OUTPATIENT_CLINIC_OR_DEPARTMENT_OTHER)
Admission: EM | Admit: 2012-09-10 | Discharge: 2012-09-13 | DRG: 310 | Disposition: A | Discharge status: Home / Self Care | Payer: Medicaid Other | Attending: Internal Medicine | Admitting: Internal Medicine

## 2012-09-10 DIAGNOSIS — J4489 Other specified chronic obstructive pulmonary disease: Secondary | ICD-10-CM | POA: Diagnosis present

## 2012-09-10 DIAGNOSIS — I4581 Long QT syndrome: Secondary | ICD-10-CM | POA: Diagnosis present

## 2012-09-10 DIAGNOSIS — Z7982 Long term (current) use of aspirin: Secondary | ICD-10-CM

## 2012-09-10 DIAGNOSIS — F411 Generalized anxiety disorder: Secondary | ICD-10-CM | POA: Diagnosis present

## 2012-09-10 DIAGNOSIS — J449 Chronic obstructive pulmonary disease, unspecified: Secondary | ICD-10-CM | POA: Diagnosis present

## 2012-09-10 DIAGNOSIS — I2510 Atherosclerotic heart disease of native coronary artery without angina pectoris: Secondary | ICD-10-CM | POA: Diagnosis present

## 2012-09-10 DIAGNOSIS — F17200 Nicotine dependence, unspecified, uncomplicated: Secondary | ICD-10-CM | POA: Diagnosis present

## 2012-09-10 DIAGNOSIS — G40909 Epilepsy, unspecified, not intractable, without status epilepticus: Secondary | ICD-10-CM | POA: Diagnosis present

## 2012-09-10 DIAGNOSIS — F3289 Other specified depressive episodes: Secondary | ICD-10-CM | POA: Diagnosis present

## 2012-09-10 DIAGNOSIS — Z95810 Presence of automatic (implantable) cardiac defibrillator: Secondary | ICD-10-CM

## 2012-09-10 DIAGNOSIS — I4891 Unspecified atrial fibrillation: Principal | ICD-10-CM

## 2012-09-10 DIAGNOSIS — M1710 Unilateral primary osteoarthritis, unspecified knee: Secondary | ICD-10-CM | POA: Diagnosis present

## 2012-09-10 DIAGNOSIS — Z8673 Personal history of transient ischemic attack (TIA), and cerebral infarction without residual deficits: Secondary | ICD-10-CM

## 2012-09-10 DIAGNOSIS — G43909 Migraine, unspecified, not intractable, without status migrainosus: Secondary | ICD-10-CM | POA: Diagnosis present

## 2012-09-10 DIAGNOSIS — F329 Major depressive disorder, single episode, unspecified: Secondary | ICD-10-CM | POA: Diagnosis present

## 2012-09-10 DIAGNOSIS — Z8679 Personal history of other diseases of the circulatory system: Secondary | ICD-10-CM

## 2012-09-10 DIAGNOSIS — IMO0001 Reserved for inherently not codable concepts without codable children: Secondary | ICD-10-CM | POA: Diagnosis present

## 2012-09-10 DIAGNOSIS — Z79899 Other long term (current) drug therapy: Secondary | ICD-10-CM

## 2012-09-10 HISTORY — DX: Unspecified atrial fibrillation: I48.91

## 2012-09-10 LAB — BASIC METABOLIC PANEL
BUN: 23 mg/dL (ref 6–23)
Chloride: 100 mEq/L (ref 96–112)
Creatinine, Ser: 1.3 mg/dL — ABNORMAL HIGH (ref 0.50–1.10)
Glucose, Bld: 97 mg/dL (ref 70–99)
Potassium: 3.9 mEq/L (ref 3.5–5.1)

## 2012-09-10 LAB — CBC
HCT: 39.5 % (ref 36.0–46.0)
Hemoglobin: 13.5 g/dL (ref 12.0–15.0)
MCH: 32.1 pg (ref 26.0–34.0)
MCHC: 34.2 g/dL (ref 30.0–36.0)

## 2012-09-10 MED ORDER — DIAZEPAM 5 MG/ML IJ SOLN
5.0000 mg | Freq: Once | INTRAMUSCULAR | Status: AC
  Administered 2012-09-10: 5 mg via INTRAVENOUS

## 2012-09-10 MED ORDER — ONDANSETRON HCL 4 MG/2ML IJ SOLN
4.0000 mg | Freq: Once | INTRAMUSCULAR | Status: AC
  Administered 2012-09-11: 4 mg via INTRAVENOUS
  Filled 2012-09-10: qty 2

## 2012-09-10 MED ORDER — METOPROLOL TARTRATE 1 MG/ML IV SOLN
5.0000 mg | INTRAVENOUS | Status: DC | PRN
  Administered 2012-09-10 – 2012-09-12 (×3): 5 mg via INTRAVENOUS
  Filled 2012-09-10 (×3): qty 5

## 2012-09-10 MED ORDER — ENOXAPARIN SODIUM 80 MG/0.8ML ~~LOC~~ SOLN
1.0000 mg/kg | Freq: Once | SUBCUTANEOUS | Status: AC
  Administered 2012-09-11: 80 mg via SUBCUTANEOUS
  Filled 2012-09-10: qty 0.8

## 2012-09-10 MED ORDER — ONDANSETRON HCL 4 MG/2ML IJ SOLN
4.0000 mg | Freq: Once | INTRAMUSCULAR | Status: AC
  Administered 2012-09-10: 4 mg via INTRAVENOUS
  Filled 2012-09-10: qty 2

## 2012-09-10 MED ORDER — WARFARIN SODIUM 5 MG PO TABS: 5.0000 mg | ORAL_TABLET | Freq: Once | ORAL | Status: DC

## 2012-09-10 NOTE — ED Notes (Signed)
Pt states she developed pafib 9 months ago, pt currently on metoprolol, pt refused to take coumadin, pt reports multiple other episodes recently of rapid heart rate, but felt she could tough it out and eventually it went down.

## 2012-09-10 NOTE — ED Provider Notes (Signed)
History     CSN: [id number]  Arrival date & time 09/10/12  2000   First MD Initiated Contact with Patient 09/10/12 2104      Chief Complaint  Patient presents with  . Atrial Fibrillation    (Consider location/radiation/quality/duration/timing/severity/associated sxs/prior treatment) HPIKatrina Judie Petit Fowler is a 61 y.o. female with a history of paroxysmal atrial fibrillation, stroke, anxiety depression, COPD who presents in atrial fibrillation. Currently she is only rate controlled on metoprolol, she refused Coumadin therapy because "I heard bad things about it." She also has a history of long QT syndrome and syncope being treated by Dr. Ladona Ridgel her cardiologist with a defibrillator. She describes some mild shortness of breath and chest tightness when the atrial fibrillation initially started, but is pain-free at this time. Patient says she had some coffee earlier if there, she thought it was decaffeinated but she thinks it was caffeinated causing her problems. Patient did have 3 aspirin today and one extra tablet of metoprolol.    On review of systems she complains about some left eye pain earlier she thought was a headache but it went away he was a moderate stabbing which was transient.   Past Medical History  Diagnosis Date  . Coronary artery disease     non obstructive  . Stroke   . Seizures   . Anxiety   . Depression   . Migraine   . Fibromuscular dysplasia   . Tobacco abuse   . COPD (chronic obstructive pulmonary disease)   . Arthritis of knee     Bilateral  . Tobacco abuse   . Migraine headache   . Anxiety disorder   . Depression   . Seizure disorder   . Cerebrovascular disease   . CAD (coronary artery disease)   . Automatic implantable cardiac defibrillator in situ   . HTN (hypertension)   . HLD (hyperlipidemia)   . Ventricular fibrillation   . Long QT syndrome   . Cardiac arrest   . Atrial fibrillation     Past Surgical History  Procedure Date  . Pacemaker  insertion 05/01/2003    ICD, implantation of a Guidant single chamber defibrillator, Doylene Canning. Ladona Ridgel MD  . Cholecystectomy   . Tubal ligation   . Breast lumpectomy     Family History  Problem Relation Age of Onset  . Dementia Mother   . Stroke Mother     Multiple  . Aneurysm Sister     Brain  . Uterine cancer Other     Grandmother  . Colon cancer Sister     History  Substance Use Topics  . Smoking status: Current Some Day Smoker -- 1.0 packs/day for 40 years    Types: Cigarettes  . Smokeless tobacco: Never Used     Comment: almost a pack a week  . Alcohol Use: No    OB History    Grav Para Term Preterm Abortions TAB SAB Ect Mult Living                  Review of Systems At least 10pt or greater review of systems completed and are negative except where specified in the HPI.  Allergies  Azithromycin; Codeine; Demerol; Ibuprofen; and Propoxyphene-acetaminophen  Home Medications   Current Outpatient Rx  Name  Route  Sig  Dispense  Refill  . ASPIRIN 325 MG PO TABS   Oral   Take 325 mg by mouth daily.           . ATORVASTATIN CALCIUM 40  MG PO TABS   Oral   Take 40 mg by mouth daily.          Marland Kitchen DIAZEPAM 5 MG PO TABS   Oral   Take 1 tablet (5 mg total) by mouth 2 (two) times daily.   15 tablet   0   . DIPHENHYDRAMINE HCL 25 MG PO TABS   Oral   Take 25 mg by mouth every 6 (six) hours as needed.           Marland Kitchen METOPROLOL TARTRATE 25 MG PO TABS   Oral   Take 1 tablet (25 mg total) by mouth 2 (two) times daily.   60 tablet   11   . SERTRALINE HCL 50 MG PO TABS   Oral   Take 50 mg by mouth daily.         . ALBUTEROL SULFATE HFA 108 (90 BASE) MCG/ACT IN AERS   Inhalation   Inhale 2 puffs into the lungs every 6 (six) hours as needed.           Marland Kitchen DIPHENHYDRAMINE-APAP (SLEEP) 25-500 MG PO TABS   Oral   Take 1 tablet by mouth at bedtime as needed.         Marland Kitchen ADVAIR DISKUS IN   Inhalation   Inhale into the lungs.          Marland Kitchen PAROXETINE HCL 30  MG PO TABS   Oral   Take 2 tablets (60 mg total) by mouth every morning.   60 tablet   0   . POTASSIUM CHLORIDE CRYS ER 20 MEQ PO TBCR   Oral   Take 1 tablet (20 mEq total) by mouth 2 (two) times daily.   10 tablet   0   . SIMVASTATIN 20 MG PO TABS   Oral   Take 40 mg by mouth at bedtime.         Marland Kitchen SIMVASTATIN 40 MG PO TABS   Oral   Take 40 mg by mouth every evening. Currently out of pills           Temp 98.3 F (36.8 C)  Resp 25  Ht 5\' 4"  (1.626 m)  Wt 171 lb (77.565 kg)  BMI 29.35 kg/m2  SpO2 97%  Physical Exam  Nursing notes reviewed.  Electronic medical record reviewed. VITAL SIGNS:   Filed Vitals:   09/10/12 2215 09/10/12 2342 09/11/12 0006 09/11/12 0103  BP: 79/50 104/86  117/63  Pulse: 132   71  Temp:   98.6 F (37 C) 97.8 F (36.6 C)  TempSrc:    Oral  Resp: 14 18  12   Height:      Weight:      SpO2: 99% 99%  99%   CONSTITUTIONAL: Awake, oriented, appears non-toxic HENT: Atraumatic, normocephalic, oral mucosa pink and moist, airway patent. Nares patent without drainage. External ears normal. EYES: Conjunctiva clear, EOMI, no discharge NECK: Trachea midline, non-tender, supple CARDIOVASCULAR: Tachycardic rate with an irregularly irregular rhythm, No murmurs, rubs, gallops PULMONARY/CHEST: Clear to auscultation, no rhonchi, wheezes, or rales. Symmetrical breath sounds. Non-tender. ABDOMINAL: Non-distended, soft, non-tender - no rebound or guarding.  BS normal. NEUROLOGIC: Non-focal, moving all four extremities, no gross sensory or motor deficits. EXTREMITIES: No clubbing, cyanosis, or edema SKIN: Warm, Dry, No erythema, No rash  ED Course  Procedures (including critical care time)  Date: 09/11/2012  Rate: 102  Rhythm: Atrial fibrillation with rapid ventricular response  QRS Axis: normal  Intervals: normal  ST/T Wave abnormalities:  normal  Conduction Disutrbances: none  Narrative Interpretation: Atrial fibrillation with rapid ventricular  response no ST or T wave abnormalities consistent with ischemia or infarction  Labs Reviewed  BASIC METABOLIC PANEL - Abnormal; Notable for the following:    Creatinine, Ser 1.30 (*)     GFR calc non Af Amer 43 (*)     GFR calc Af Amer 50 (*)     All other components within normal limits  CBC  TROPONIN I   No results found.   1. Atrial fibrillation       MDM  [name] is a 61 y.o. female presenting with atrial fibrillation. Patient is followed by Dr. Ladona Ridgel. Patient initially refused Coumadin therapy however on discussion thinks she may be an adult to it. She was given IV metoprolol which failed to convert her, rates of her rapid ventricular stayed in the high 90s or low 100s. Discussed with cardiology fellow at [facility] - no further medications for rate control at this time. I did start Lovenox and Coumadin she says she wants to go on Coumadin therapy - cardiology to discuss with her further.  EKG is unremarkable - atrial fibrillation without ST or T-wave changes, troponin is negative  She is comfortable and pain-free she'll be transferred stable and in good condition to [facility] for admission to the telemetry floor        Jones Skene, MD 09/11/12 [phone]

## 2012-09-10 NOTE — ED Notes (Signed)
Pt went to [employer] to [place] , she had a cup of coffee which she thinks precipatated her afib, she felt her heart racing, called ems + sob

## 2012-09-10 NOTE — ED Notes (Signed)
Pt took 2 325 asa prior to ems arrival, pt does have defibrilator

## 2012-09-10 NOTE — ED Notes (Signed)
Family at bedside. 

## 2012-09-10 NOTE — ED Notes (Signed)
MD at bedside. 

## 2012-09-10 NOTE — ED Notes (Signed)
Pt talking nonstop the whole time this rn in room, stating she doesn't like to be alone, encourage patient to decrease talking and take slow deep breaths pt continued to carring on conversation with no dyspnea the whole time, nor did her o2 sats drop and she was on ra

## 2012-09-11 DIAGNOSIS — I4891 Unspecified atrial fibrillation: Principal | ICD-10-CM

## 2012-09-11 LAB — CBC
HCT: 35.6 % — ABNORMAL LOW (ref 36.0–46.0)
Hemoglobin: 11.9 g/dL — ABNORMAL LOW (ref 12.0–15.0)
MCH: 31.5 pg (ref 26.0–34.0)
MCHC: 33.4 g/dL (ref 30.0–36.0)
MCV: 94.2 fL (ref 78.0–100.0)
Platelets: 256 10*3/uL (ref 150–400)
RBC: 3.78 MIL/uL — ABNORMAL LOW (ref 3.87–5.11)
RDW: 12.6 % (ref 11.5–15.5)
WBC: 8.4 10*3/uL (ref 4.0–10.5)

## 2012-09-11 LAB — BASIC METABOLIC PANEL WITH GFR
BUN: 19 mg/dL (ref 6–23)
CO2: 25 meq/L (ref 19–32)
Calcium: 9.2 mg/dL (ref 8.4–10.5)
Chloride: 106 meq/L (ref 96–112)
Creatinine, Ser: 1.18 mg/dL — ABNORMAL HIGH (ref 0.50–1.10)
GFR calc Af Amer: 56 mL/min — ABNORMAL LOW
GFR calc non Af Amer: 48 mL/min — ABNORMAL LOW
Glucose, Bld: 82 mg/dL (ref 70–99)
Potassium: 4.6 meq/L (ref 3.5–5.1)
Sodium: 141 meq/L (ref 135–145)

## 2012-09-11 LAB — PROTIME-INR
INR: 1.03 (ref 0.00–1.49)
Prothrombin Time: 13.4 s (ref 11.6–15.2)

## 2012-09-11 MED ORDER — ASPIRIN 325 MG PO TABS
325.0000 mg | ORAL_TABLET | Freq: Every day | ORAL | Status: DC
  Administered 2012-09-11 – 2012-09-13 (×3): 325 mg via ORAL
  Filled 2012-09-11 (×3): qty 1

## 2012-09-11 MED ORDER — WARFARIN - PHYSICIAN DOSING INPATIENT: Freq: Every day | Status: DC

## 2012-09-11 MED ORDER — DIPHENHYDRAMINE HCL 25 MG PO CAPS
25.0000 mg | ORAL_CAPSULE | Freq: Every evening | ORAL | Status: DC | PRN
  Administered 2012-09-11 – 2012-09-12 (×2): 25 mg via ORAL
  Filled 2012-09-11 (×3): qty 1

## 2012-09-11 MED ORDER — SODIUM CHLORIDE 0.9 % IJ SOLN
3.0000 mL | Freq: Two times a day (BID) | INTRAMUSCULAR | Status: DC
  Administered 2012-09-11 – 2012-09-12 (×2): 3 mL via INTRAVENOUS

## 2012-09-11 MED ORDER — SERTRALINE HCL 50 MG PO TABS
50.0000 mg | ORAL_TABLET | Freq: Every day | ORAL | Status: DC
  Administered 2012-09-11 – 2012-09-13 (×3): 50 mg via ORAL
  Filled 2012-09-11 (×4): qty 1

## 2012-09-11 MED ORDER — METOPROLOL TARTRATE 25 MG PO TABS
25.0000 mg | ORAL_TABLET | Freq: Two times a day (BID) | ORAL | Status: DC
  Administered 2012-09-11 – 2012-09-13 (×5): 25 mg via ORAL
  Filled 2012-09-11 (×6): qty 1

## 2012-09-11 MED ORDER — FLUTICASONE-SALMETEROL 250-50 MCG/DOSE IN AEPB: 1.0000 | INHALATION_SPRAY | Freq: Two times a day (BID) | RESPIRATORY_TRACT | Status: DC

## 2012-09-11 MED ORDER — ENOXAPARIN SODIUM 80 MG/0.8ML ~~LOC~~ SOLN
1.0000 mg/kg | Freq: Two times a day (BID) | SUBCUTANEOUS | Status: DC
  Administered 2012-09-11 (×2): 80 mg via SUBCUTANEOUS
  Filled 2012-09-11 (×4): qty 0.8

## 2012-09-11 MED ORDER — SIMVASTATIN 40 MG PO TABS
40.0000 mg | ORAL_TABLET | Freq: Every evening | ORAL | Status: DC
  Administered 2012-09-11 – 2012-09-12 (×2): 40 mg via ORAL
  Filled 2012-09-11 (×3): qty 1

## 2012-09-11 MED ORDER — SODIUM CHLORIDE 0.9 % IJ SOLN
3.0000 mL | Freq: Two times a day (BID) | INTRAMUSCULAR | Status: DC
  Administered 2012-09-11 – 2012-09-13 (×3): 3 mL via INTRAVENOUS

## 2012-09-11 MED ORDER — ALBUTEROL SULFATE HFA 108 (90 BASE) MCG/ACT IN AERS
2.0000 | INHALATION_SPRAY | Freq: Four times a day (QID) | RESPIRATORY_TRACT | Status: DC | PRN
  Administered 2012-09-12: 2 via RESPIRATORY_TRACT

## 2012-09-11 MED ORDER — SODIUM CHLORIDE 0.9 % IV SOLN
250.0000 mL | INTRAVENOUS | Status: DC | PRN
Start: 2012-09-11 — End: 2012-09-13

## 2012-09-11 MED ORDER — MOMETASONE FURO-FORMOTEROL FUM 100-5 MCG/ACT IN AERO
2.0000 | INHALATION_SPRAY | Freq: Two times a day (BID) | RESPIRATORY_TRACT | Status: DC
  Administered 2012-09-11 – 2012-09-13 (×5): 2 via RESPIRATORY_TRACT
  Filled 2012-09-11: qty 8.8

## 2012-09-11 MED ORDER — DIAZEPAM 5 MG PO TABS
5.0000 mg | ORAL_TABLET | Freq: Two times a day (BID) | ORAL | Status: DC
  Administered 2012-09-11 – 2012-09-13 (×5): 5 mg via ORAL
  Filled 2012-09-11 (×4): qty 1

## 2012-09-11 MED ORDER — ACETAMINOPHEN 500 MG PO TABS
500.0000 mg | ORAL_TABLET | Freq: Four times a day (QID) | ORAL | Status: DC | PRN
  Administered 2012-09-11 – 2012-09-12 (×3): 500 mg via ORAL
  Filled 2012-09-11 (×3): qty 1

## 2012-09-11 MED ORDER — DIPHENHYDRAMINE-APAP (SLEEP) 25-500 MG PO TABS: 1.0000 | ORAL_TABLET | Freq: Every evening | ORAL | Status: DC | PRN

## 2012-09-11 MED ORDER — WARFARIN SODIUM 7.5 MG PO TABS
7.5000 mg | ORAL_TABLET | Freq: Once | ORAL | Status: AC
  Administered 2012-09-11: 7.5 mg via ORAL
  Filled 2012-09-11: qty 1

## 2012-09-11 MED ORDER — SODIUM CHLORIDE 0.9 % IJ SOLN: 3.0000 mL | INTRAMUSCULAR | Status: DC | PRN

## 2012-09-11 NOTE — H&P (Signed)
Cardiology History and Physical  CLOWARD,DAVIS L, MD  History of Present Illness (and review of medical records): [name] is a 61 y.o. female who presents for evaluation of rapid heart beat.  She has history of VF arrest 2/2 QT prolongation s/p ICD, and paroxysmal atrial fibrillation on BB and ASA due to low CHADS.  She has been hospitalized severe times in early spring of 2013 for Afib/aflutter with RVR that would convert to SR with Cardizem.  She now returns with rapid heart beat after drinking caffeinated coffee.  She ordered decaffeinated while at a restaurant around 7pm.  Symptoms started 30 mins later.  She had associated shortness of breath and generalized weakness.  No chest pain, presyncope or syncope.  She states last similar episode was in October.  She does have hx of anxiety/depression with current transition of medications from Paxil to Zoloft.  This has led to recent increase in "panic attacks" also.   Review of Systems Further review of systems was otherwise negative other than stated in HPI.  Patient Active Problem List   Diagnosis Date Noted  . Atrial fibrillation 04/07/2011  . ANXIETY DISORDER 05/22/2009  . TOBACCO ABUSE 05/22/2009  . DEPRESSION 05/22/2009  . MIGRAINE HEADACHE 05/22/2009  . CAD 05/22/2009  . LONG QT SYNDROME 05/22/2009  . CARDIAC ARREST 05/22/2009  . CEREBROVASCULAR DISEASE 05/22/2009  . COPD 05/22/2009  . ARTHRITIS, KNEES, BILATERAL 05/22/2009  . SEIZURE DISORDER 05/22/2009  . AUTOMATIC IMPLANTABLE CARDIAC DEFIBRILLATOR SITU 05/22/2009   Past Medical History  Diagnosis Date  . Coronary artery disease     non obstructive  . Stroke   . Seizures   . Anxiety   . Depression   . Migraine   . Fibromuscular dysplasia   . Tobacco abuse   . COPD (chronic obstructive pulmonary disease)   . Arthritis of knee     Bilateral  . Tobacco abuse   . Migraine headache   . Anxiety disorder   . Depression   . Seizure disorder   . Cerebrovascular  disease   . CAD (coronary artery disease)   . Automatic implantable cardiac defibrillator in situ   . HTN (hypertension)   . HLD (hyperlipidemia)   . Ventricular fibrillation   . Long QT syndrome   . Cardiac arrest   . Atrial fibrillation     Past Surgical History  Procedure Date  . Pacemaker insertion 05/01/2003    ICD, implantation of a Guidant single chamber defibrillator, Doylene Canning. Ladona Ridgel MD  . Cholecystectomy   . Tubal ligation   . Breast lumpectomy     Prescriptions prior to admission  Medication Sig Dispense Refill  . aspirin 325 MG tablet Take 325 mg by mouth daily.        Marland Kitchen atorvastatin (LIPITOR) 40 MG tablet Take 40 mg by mouth daily.       . diazepam (VALIUM) 5 MG tablet Take 1 tablet (5 mg total) by mouth 2 (two) times daily.  15 tablet  0  . diphenhydrAMINE (BENADRYL) 25 MG tablet Take 25 mg by mouth every 6 (six) hours as needed.        . metoprolol tartrate (LOPRESSOR) 25 MG tablet Take 1 tablet (25 mg total) by mouth 2 (two) times daily.  60 tablet  11  . sertraline (ZOLOFT) 50 MG tablet Take 50 mg by mouth daily.      Marland Kitchen albuterol (VENTOLIN HFA) 108 (90 BASE) MCG/ACT inhaler Inhale 2 puffs into the lungs every 6 (six)  hours as needed.        . diphenhydramine-acetaminophen (TYLENOL PM) 25-500 MG TABS Take 1 tablet by mouth at bedtime as needed.      . Fluticasone-Salmeterol (ADVAIR DISKUS IN) Inhale into the lungs.       Marland Kitchen PARoxetine (PAXIL) 30 MG tablet Take 2 tablets (60 mg total) by mouth every morning.  60 tablet  0  . potassium chloride SA (K-DUR,KLOR-CON) 20 MEQ tablet Take 1 tablet (20 mEq total) by mouth 2 (two) times daily.  10 tablet  0  . simvastatin (ZOCOR) 20 MG tablet Take 40 mg by mouth at bedtime.      . simvastatin (ZOCOR) 40 MG tablet Take 40 mg by mouth every evening. Currently out of pills       Allergies  Allergen Reactions  . Azithromycin   . Codeine   . Demerol (Meperidine)   . Ibuprofen   . Propoxyphene-Acetaminophen     History   Substance Use Topics  . Smoking status: Current Some Day Smoker -- 1.0 packs/day for 40 years    Types: Cigarettes  . Smokeless tobacco: Never Used     Comment: almost a pack a week  . Alcohol Use: No    Family History  Problem Relation Age of Onset  . Dementia Mother   . Stroke Mother     Multiple  . Aneurysm Sister     Brain  . Uterine cancer Other     Grandmother  . Colon cancer Sister      Objective: Patient Vitals for the past 8 hrs:  BP Temp Temp src Pulse Resp SpO2 Height Weight  09/11/12 0412 129/79 mmHg 97.6 F (36.4 C) Oral 63  - 100 % - -  09/11/12 0208 124/70 mmHg 97.4 F (36.3 C) Oral 66  - 99 % 5\' 4"  (1.626 m) 79.379 kg (175 lb)  09/11/12 0103 117/63 mmHg 97.8 F (36.6 C) Oral 71  12  99 % - -  09/11/12 0006 - 98.6 F (37 C) - - - - - -  09/10/12 2342 104/86 mmHg - - - 18  99 % - -   General Appearance:    Alert, cooperative, no distress, appears stated age  Head:    Normocephalic, without obvious abnormality, atraumatic  Eyes:     PERRL, EOMI, anicteric sclerae  Neck:   Supple, no carotid bruit or JVD  Lungs:     Clear to auscultation bilaterally, respirations unlabored  Heart:    Regular rate and rhythm, S1 and S2 normal, no murmur  Abdomen:     Soft, non-tender, normoactive bowel sounds  Extremities:   Extremities normal, atraumatic, no cyanosis or edema  Pulses:   2+ and symmetric all extremities  Skin:   no rashes or lesions  Neurologic:   No focal deficits. AAO x3   Results for orders placed during the hospital encounter of 09/10/12 (from the past 48 hour(s))  BASIC METABOLIC PANEL     Status: Abnormal   Collection Time   09/10/12  8:32 PM      Component Value Range Comment   Sodium 137  135 - 145 mEq/L    Potassium 3.9  3.5 - 5.1 mEq/L    Chloride 100  96 - 112 mEq/L    CO2 25  19 - 32 mEq/L    Glucose, Bld 97  70 - 99 mg/dL    BUN 23  6 - 23 mg/dL    Creatinine, Ser 1.61 (*) 0.50 -  1.10 mg/dL    Calcium 9.8  8.4 - 52.8 mg/dL    GFR calc  non Af Amer 43 (*) >90 mL/min    GFR calc Af Amer 50 (*) >90 mL/min   CBC     Status: Normal   Collection Time   09/10/12  8:32 PM      Component Value Range Comment   WBC 8.7  4.0 - 10.5 K/uL    RBC 4.20  3.87 - 5.11 MIL/uL    Hemoglobin 13.5  12.0 - 15.0 g/dL    HCT 41.3  24.4 - 01.0 %    MCV 94.0  78.0 - 100.0 fL    MCH 32.1  26.0 - 34.0 pg    MCHC 34.2  30.0 - 36.0 g/dL    RDW 27.2  53.6 - 64.4 %    Platelets 284  150 - 400 K/uL   TROPONIN I     Status: Normal   Collection Time   09/10/12  9:21 PM      Component Value Range Comment   Troponin I <0.30  <0.30 ng/mL   BASIC METABOLIC PANEL     Status: Abnormal   Collection Time   09/11/12  4:53 AM      Component Value Range Comment   Sodium 141  135 - 145 mEq/L    Potassium 4.6  3.5 - 5.1 mEq/L    Chloride 106  96 - 112 mEq/L    CO2 25  19 - 32 mEq/L    Glucose, Bld 82  70 - 99 mg/dL    BUN 19  6 - 23 mg/dL    Creatinine, Ser 0.34 (*) 0.50 - 1.10 mg/dL    Calcium 9.2  8.4 - 74.2 mg/dL    GFR calc non Af Amer 48 (*) >90 mL/min    GFR calc Af Amer 56 (*) >90 mL/min   CBC     Status: Abnormal   Collection Time   09/11/12  4:53 AM      Component Value Range Comment   WBC 8.4  4.0 - 10.5 K/uL    RBC 3.78 (*) 3.87 - 5.11 MIL/uL    Hemoglobin 11.9 (*) 12.0 - 15.0 g/dL    HCT 59.5 (*) 63.8 - 46.0 %    MCV 94.2  78.0 - 100.0 fL    MCH 31.5  26.0 - 34.0 pg    MCHC 33.4  30.0 - 36.0 g/dL    RDW 75.6  43.3 - 29.5 %    Platelets 256  150 - 400 K/uL   PROTIME-INR     Status: Normal   Collection Time   09/11/12  4:53 AM      Component Value Range Comment   Prothrombin Time 13.4  11.6 - 15.2 seconds    INR 1.03  0.00 - 1.49    No results found.  ECG:  Revealed probably atrial fib/flutter with variable AV block Hr 102.  Now in sinus rhythm Hr 70s  Assessment: Atrial Fibrillation with RVR.  Now in sinus rhythm VF arrest due to long QT s/p ICD  Plan:  1. Admit to Cardiology 2. Continuous monitoring on  Telemetry. 3. Repeat ekg on admit, prn chest pain or arrythmia 4. Continue BB at this time.  Consider initiation of antiarrythmic. 5. Will obtain TTE to assess LV function. 6. Continue ASA 325mg , patient also more agreeable to anticoagulation. Given Lovenox at OSH.

## 2012-09-11 NOTE — ED Notes (Signed)
Attempting to call report to [facility] unavailable, left number for return call

## 2012-09-11 NOTE — Progress Notes (Signed)
  Echocardiogram 2D Echocardiogram has been performed.  Georgian Co 09/11/2012, 10:26 AM

## 2012-09-11 NOTE — Progress Notes (Addendum)
Patient ID: [name], female   DOB: 1950/09/28, 61 y.o.   MRN: [id number]    Subjective:  Denies SSCP, palpitations or Dyspnea Anxiety has been worse Takes valium at home and recently changes to zoloft 50 mg about a week ago  Objective:  Filed Vitals:   09/11/12 0208 09/11/12 0412 09/11/12 0925 09/11/12 1033  BP: 124/70 129/79  110/75  Pulse: 66 63  73  Temp: 97.4 F (36.3 C) 97.6 F (36.4 C)    TempSrc: Oral Oral    Resp:      Height: 5\' 4"  (1.626 m)     Weight: 175 lb (79.379 kg)     SpO2: 99% 100% 100%     Intake/Output from previous day: No intake or output data in the 24 hours ending 09/11/12 1108  Physical Exam: Affect appropriate Healthy:  appears stated age HEENT: normal Neck supple with no adenopathy JVP normal no bruits no thyromegaly Lungs clear with no wheezing and good diaphragmatic motion Heart:  S1/S2 no murmur, no rub, gallop or click PMI normal Abdomen: benighn, BS positve, no tenderness, no AAA no bruit.  No HSM or HJR Distal pulses intact with no bruits No edema Neuro non-focal Skin warm and dry No muscular weakness AICD under left clavicle  Lab Results: Basic Metabolic Panel:  Basename 09/11/12 0453 09/10/12 2032  NA 141 137  K 4.6 3.9  CL 106 100  CO2 25 25  GLUCOSE 82 97  BUN 19 23  CREATININE 1.18* 1.30*  CALCIUM 9.2 9.8  MG -- --  PHOS -- --   CBC:  Basename 09/11/12 0453 09/10/12 2032  WBC 8.4 8.7  NEUTROABS -- --  HGB 11.9* 13.5  HCT 35.6* 39.5  MCV 94.2 94.0  PLT 256 284   Cardiac Enzymes:  Basename 09/10/12 2121  CKTOTAL --  CKMB --  CKMBINDEX --  TROPONINI <0.30    Imaging: No results found.  Cardiac Studies:  ECG:  Afib rate 102  Nonspecific St/T wave changes   Telemetry:  NSR 09/11/2012   Echo: pending  Medications:     . aspirin  325 mg Oral Daily  . diazepam  5 mg Oral BID  . enoxaparin (LOVENOX) injection  1 mg/kg Subcutaneous Q12H  . metoprolol tartrate  25 mg Oral BID  .  mometasone-formoterol  2 puff Inhalation BID  . sertraline  50 mg Oral Daily  . simvastatin  40 mg Oral QPM  . sodium chloride  3 mL Intravenous Q12H  . sodium chloride  3 mL Intravenous Q12H  . warfarin  5 mg Oral Once       Assessment/Plan:  PAF:  Has had episodes before Hard to tell frequency do to anxiety.  Started on coumadin by admitting doctor.  Will have guidant interogate device  Although from CXR it looks like it may be a single lead device  Echo pending Continue beta blocker Anxiety:  Continue zoloft at 50 mg and valium at 5 bid Cholesterol continue statin  Charlton Haws 09/11/2012, 11:08 AM

## 2012-09-12 ENCOUNTER — Encounter (HOSPITAL_COMMUNITY): Payer: Self-pay | Admitting: Physician Assistant

## 2012-09-12 DIAGNOSIS — Z95810 Presence of automatic (implantable) cardiac defibrillator: Secondary | ICD-10-CM

## 2012-09-12 DIAGNOSIS — Z8679 Personal history of other diseases of the circulatory system: Secondary | ICD-10-CM

## 2012-09-12 DIAGNOSIS — I4891 Unspecified atrial fibrillation: Secondary | ICD-10-CM

## 2012-09-12 LAB — PROTIME-INR: Prothrombin Time: 14.1 seconds (ref 11.6–15.2)

## 2012-09-12 MED ORDER — METOPROLOL TARTRATE 25 MG PO TABS
25.0000 mg | ORAL_TABLET | Freq: Once | ORAL | Status: DC
  Filled 2012-09-12: qty 1

## 2012-09-12 MED ORDER — ALBUTEROL SULFATE HFA 108 (90 BASE) MCG/ACT IN AERS: 2.0000 | INHALATION_SPRAY | Freq: Four times a day (QID) | RESPIRATORY_TRACT | Status: DC | PRN

## 2012-09-12 MED ORDER — RIVAROXABAN 20 MG PO TABS: 20.0000 mg | ORAL_TABLET | Freq: Every day | ORAL | Status: DC

## 2012-09-12 MED ORDER — RIVAROXABAN 20 MG PO TABS
20.0000 mg | ORAL_TABLET | Freq: Every day | ORAL | Status: DC
  Administered 2012-09-12 – 2012-09-13 (×2): 20 mg via ORAL
  Filled 2012-09-12 (×2): qty 1

## 2012-09-12 MED ORDER — ASPIRIN 81 MG PO TABS: 81.0000 mg | ORAL_TABLET | Freq: Every day | ORAL | Status: DC

## 2012-09-12 MED ORDER — RIVAROXABAN 15 MG PO TABS
15.0000 mg | ORAL_TABLET | Freq: Every day | ORAL | Status: DC
  Filled 2012-09-12: qty 1

## 2012-09-12 MED ORDER — ACETAMINOPHEN 325 MG PO TABS
650.0000 mg | ORAL_TABLET | Freq: Once | ORAL | Status: AC
  Administered 2012-09-12: 650 mg via ORAL
  Filled 2012-09-12: qty 2

## 2012-09-12 MED ORDER — DIAZEPAM 5 MG PO TABS: 5.0000 mg | ORAL_TABLET | Freq: Two times a day (BID) | ORAL | Status: DC

## 2012-09-12 MED ORDER — DIPHENHYDRAMINE HCL 25 MG PO CAPS
25.0000 mg | ORAL_CAPSULE | Freq: Once | ORAL | Status: AC
  Administered 2012-09-12: 25 mg via ORAL

## 2012-09-12 NOTE — Discharge Summary (Signed)
Discharge Summary   Patient ID: [name],  MRN: [id number], DOB/AGE: 03/06/1951 61 y.o.  Admit date: 09/10/2012 Discharge date: 09/13/2012  Primary Physician: Lavell Islam, MD Primary Cardiologist: Lorine Bears, MD/Gregg Ladona Ridgel, MD  Discharge Diagnoses Principal Problem:  *Atrial fibrillation with RVR Active Problems:  ANXIETY DISORDER  CAD  Long QT syndrome  History of ventricular fibrillation  ICD (implantable cardiac defibrillator) in place   Allergies Allergies  Allergen Reactions  . Azithromycin Other (See Comments)    Low blood pressure.  . Ibuprofen     Low blood prescription.  . Codeine Rash  . Demerol (Meperidine) Rash    Diagnostic Studies/Procedures  TRANSTHORACIC ECHOCARDIOGRAM - 09/11/12  - Left ventricle: The cavity size was normal. Wall thickness was increased in a pattern of mild LVH. Systolic function was normal. The estimated ejection fraction was in the range of 55% to 60%.  - Left atrium: The atrium was mildly dilated. - Atrial septum: No defect or patent foramen ovale was identified.  History of Present Illness/Hospital Course  [name] is a 61 yo female who was observed overnight at [facility] on 09/11/12 with the above problem list. She has a history of VF arrest secondary to QT prolongation s/p [employer] ICD, PAF (on BB and full-dose ASA previously due to low CHADSS score). History of anxiety with panic attacks (recently switched from Paxil to Zoloft). She has had multiple prior hospitalizations in spring of 2013 for a-fib with RVR which would convert to SR on Cardizem. She returned for evaluated for tachy-palpitations associated with a-fib with RVR in the past after drinking caffeinated coffee. She noted associated shortness of breath and generalized weakness. She converted back to NSR spontaneously. She was resumed on prior home meds and started on Coumadin. She remained stable overnight. A 2D echo was performed  revealing LVEF 55-60%, mild LVH, mld LA dilatation. Her ICD was interrogated and indicated one prolonged episode of V-rate presumed to be driven by atrial fibrillation. Anxiety management and caffeine cessation was stressed. She will be transitioned from Coumadin to Xarelto as she does not want her INR checked frequently. This information has been clearly outlined in the discharge AVS. Of note, she has requested Valium on several occasions. This has been denied on several occasions. She has been advised to follow-up with her PCP for this. Additionally, there are several pharmacies on record for her. Will give her only 1 week's worth of Valium and advised to follow-up with her PCP for this. She was to be discharged on 12/22 however converted back to A.fib with rates 120-130s for which she was started on long acting oral diltiazem with conversion to NSR. On day of discharge she was in stable condition. She was seen and evaluated by Dr. Eden Emms who felt she was stable for discharge home. It is recommended she follow up with Dr. Ladona Ridgel to consider antiarrhythmic therapy to maintain sinus rhythm.   Discharge Vitals:  Blood pressure 130/72, pulse 84, temperature 98 F (36.7 C), temperature source Oral, resp. rate 16, height 5\' 4"  (1.626 m), weight 175 lb (79.379 kg), SpO2 94.00%.   Labs: Recent Labs  [facility] 09/11/12 0453 09/10/12 2032   WBC 8.4 8.7   HGB 11.9* 13.5   HCT 35.6* 39.5   MCV 94.2 94.0   PLT 256 284    Lab 09/11/12 0453 09/10/12 2032  NA 141 137  K 4.6 3.9  CL 106 100  CO2 25 25  BUN 19 23  CREATININE  1.18* 1.30*  CALCIUM 9.2 9.8  PROT -- --  BILITOT -- --  ALKPHOS -- --  ALT -- --  AST -- --  AMYLASE -- --  LIPASE -- --  GLUCOSE 82 97   Recent Labs  Basename 09/10/12 2121   CKTOTAL --   CKMB --   CKMBINDEX --   TROPONINI <0.30    Disposition:  Discharge Orders    Future Appointments: Provider: Department: Dept Phone: Center:   09/20/2012 9:30 AM Ok Anis, NP [facility] at [place] [phone] [id number]   10/07/2012 4:30 PM Marinus Maw, MD [facility] Main Office [place]) [phone] [facility]     Future Orders Please Complete By Expires   Diet - low sodium heart healthy      Increase activity slowly        Follow-up Information    Follow up with Lewayne Bunting, MD. On 10/07/2012. (4:30)    Contact information:   [facility] [address] [place] [address] [phone]         Discharge Medications:    Medication List     As of 09/13/2012 11:42 AM    START taking these medications         Rivaroxaban 20 MG Tabs   Commonly known as: XARELTO   Take 1 tablet (20 mg total) by mouth daily.      CHANGE how you take these medications         aspirin 81 MG tablet   Take 1 tablet (81 mg total) by mouth daily.   What changed: - medication strength - dose      simvastatin 20 MG tablet   Commonly known as: ZOCOR   Take 1 tablet (20 mg total) by mouth at bedtime.   What changed: - medication strength - dose - how often to take the med - doctor's instructions      CONTINUE taking these medications         ADVAIR DISKUS IN      albuterol 108 (90 BASE) MCG/ACT inhaler   Commonly known as: PROVENTIL HFA;VENTOLIN HFA   Inhale 2 puffs into the lungs every 6 (six) hours as needed.      diazepam 5 MG tablet   Commonly known as: VALIUM   Take 1 tablet (5 mg total) by mouth 2 (two) times daily.      diphenhydrAMINE 25 MG tablet   Commonly known as: BENADRYL      diphenhydramine-acetaminophen 25-500 MG Tabs   Commonly known as: TYLENOL PM      metoprolol tartrate 25 MG tablet   Commonly known as: LOPRESSOR   Take 1 tablet (25 mg total) by mouth 2 (two) times daily.      sertraline 50 MG tablet   Commonly known as: ZOLOFT   Take 1 tablet (50 mg total) by mouth daily.      STOP taking these medications         atorvastatin 40 MG tablet   Commonly known as:  LIPITOR          Where to get your medications    These are the prescriptions that you need to pick up. We sent them to a specific pharmacy, so you will need to go there to get them.   [facility] - [place], [place] - [address]    [address] [place] [address]    Phone: [phone]  albuterol 108 (90 BASE) MCG/ACT inhaler   Rivaroxaban 20 MG Tabs   sertraline 50 MG tablet   simvastatin 20 MG tablet         You may get these medications from any pharmacy.         diazepam 5 MG tablet         Information on where to get these meds is not yet available. Ask your nurse or doctor.         aspirin 81 MG tablet           Outstanding Labs/Studies: none  Duration of Discharge Encounter: Greater than 30 minutes including physician time.  Signed, R. Hurman Horn, PA-C 09/12/2012, 9:18 AM  Addended by Berton Mount, PA-C 09/13/2012, 11:42 AM

## 2012-09-12 NOTE — Progress Notes (Signed)
Patient converted back to a-fib. Rates 120-130s. Mild dyspnea noted. After discussing with MD, will plan to start long-acting diltiazem PO 180 mg for additional rate-control. Continue Xarelto. Will monitor overnight and reassess disposition tomorrow AM. The patient was updated. Discharge summary has been pended in the meantime.    Jacqulyn Bath, PA-C 09/12/2012 3:09 PM

## 2012-09-12 NOTE — Progress Notes (Signed)
While in the process of discharging pt home, she c/o feeling SOB, and having mild chest tightness.  Pt states, "I think I am in Afib again.  Can you please give me an extra metoprolol to take.  That is what I do at home when this happens."  Telemetry monitor back on pt confirms that pt is in afib (HR=110's-120's).  EKG taken.  Vital signs stable.  Alinda Money, PA notified.  D/C on hold for now.  Xarelto 20mg  PO given.  IV restarted and Metoprolol 5mg  IV given to pt.  2L Oxygen via [place] applied.  Will continue to monitor pt.

## 2012-09-12 NOTE — Progress Notes (Addendum)
  Patient ID: [name], female   DOB: Feb 19, 1951, 61 y.o.   MRN: [id number]    Subjective:  Having palpitations while in NSR 80's on monitor no correlation with rhythm Anxiety has been worse Takes valium at home and recently changes to zoloft 50 mg about a week ago  Objective:  Filed Vitals:   09/11/12 1850 09/11/12 2021 09/11/12 2100 09/12/12 0500  BP: 110/64  110/72 123/77  Pulse: 70  74 65  Temp:   98.6 F (37 C) 98.1 F (36.7 C)  TempSrc:      Resp:   18 18  Height:      Weight:      SpO2:  96% 97% 96%    Intake/Output from previous day:  Intake/Output Summary (Last 24 hours) at 09/12/12 0841 Last data filed at 09/11/12 2100  Gross per 24 hour  Intake    384 ml  Output      0 ml  Net    384 ml    Physical Exam: Affect appropriate Healthy:  appears stated age HEENT: normal Neck supple with no adenopathy JVP normal no bruits no thyromegaly Lungs clear with no wheezing and good diaphragmatic motion Heart:  S1/S2 no murmur, no rub, gallop or click PMI normal Abdomen: benighn, BS positve, no tenderness, no AAA no bruit.  No HSM or HJR Distal pulses intact with no bruits No edema Neuro non-focal Skin warm and dry No muscular weakness AICD under left clavicle  Lab Results: Basic Metabolic Panel:  Basename 09/11/12 0453 09/10/12 2032  NA 141 137  K 4.6 3.9  CL 106 100  CO2 25 25  GLUCOSE 82 97  BUN 19 23  CREATININE 1.18* 1.30*  CALCIUM 9.2 9.8  MG -- --  PHOS -- --   CBC:  Basename 09/11/12 0453 09/10/12 2032  WBC 8.4 8.7  NEUTROABS -- --  HGB 11.9* 13.5  HCT 35.6* 39.5  MCV 94.2 94.0  PLT 256 284   Cardiac Enzymes:  Basename 09/10/12 2121  CKTOTAL --  CKMB --  CKMBINDEX --  TROPONINI <0.30    Imaging: No results found.  Cardiac Studies:  ECG:  Afib rate 102  Nonspecific St/T wave changes   Telemetry:  NSR 09/12/2012   Echo: pending  Medications:      . aspirin  325 mg Oral Daily  . diazepam  5 mg Oral BID  .  enoxaparin (LOVENOX) injection  1 mg/kg Subcutaneous Q12H  . metoprolol tartrate  25 mg Oral BID  . mometasone-formoterol  2 puff Inhalation BID  . sertraline  50 mg Oral Daily  . simvastatin  40 mg Oral QPM  . sodium chloride  3 mL Intravenous Q12H  . sodium chloride  3 mL Intravenous Q12H  . Warfarin - Physician Dosing Inpatient   Does not apply q1800       Assessment/Plan:  PAF:  Single lead device with only one prolonged episode of high V rate presumed afib.   Anxiety:  Continue zoloft at 50 mg and valium at 5 bid Cholesterol continue statin  D/C home needs inhaler and valium scripts   Wants to be on newer anticoagulant as she cant get to coumadin clinic No transportation  Can start xarelto on dC Charlton Haws 09/12/2012, 8:41 AM

## 2012-09-13 ENCOUNTER — Telehealth: Payer: Self-pay | Admitting: *Deleted

## 2012-09-13 MED ORDER — SIMVASTATIN 20 MG PO TABS: 20.0000 mg | ORAL_TABLET | Freq: Every day | ORAL | Status: DC

## 2012-09-13 MED ORDER — DIAZEPAM 5 MG PO TABS
5.0000 mg | ORAL_TABLET | Freq: Once | ORAL | Status: DC
  Filled 2012-09-13: qty 1

## 2012-09-13 MED ORDER — SERTRALINE HCL 50 MG PO TABS: 50.0000 mg | ORAL_TABLET | Freq: Every day | ORAL | Status: DC

## 2012-09-13 NOTE — Progress Notes (Signed)
Clinical Child psychotherapist received referral for [employer].  CSW reviewed chart and attempted to meet with pt; pt currently sleeping and does not awake to her name being called.  CSW left Advance Directive packet at bedside.  CSW will attempt at a later time to review information.    Angelia Mould, MSW, [place] [phone]

## 2012-09-13 NOTE — Progress Notes (Signed)
   Patient ID: [name], female   DOB: 04-01-1951, 61 y.o.   MRN: [id number]    Subjective:  Palpitatoins from PAF  ON lopressor and cardizem now  Prefers xarelto for anticoagulation.  May need antiarrhythmic  In future ok for D/C  Objective:  Filed Vitals:   09/12/12 1435 09/12/12 2100 09/12/12 2150 09/13/12 0500  BP: 104/78 125/74  111/67  Pulse: 86 70  66  Temp:  97.7 F (36.5 C)  98 F (36.7 C)  TempSrc:      Resp:  20  16  Height:      Weight:      SpO2:  97% 98% 93%    Intake/Output from previous day:  Intake/Output Summary (Last 24 hours) at 09/13/12 0740 Last data filed at 09/13/12 0500  Gross per 24 hour  Intake    120 ml  Output      0 ml  Net    120 ml    Physical Exam: Affect appropriate Healthy:  appears stated age HEENT: normal Neck supple with no adenopathy JVP normal no bruits no thyromegaly Lungs clear with no wheezing and good diaphragmatic motion Heart:  S1/S2 no murmur, no rub, gallop or click PMI normal Abdomen: benighn, BS positve, no tenderness, no AAA no bruit.  No HSM or HJR Distal pulses intact with no bruits No edema Neuro non-focal Skin warm and dry No muscular weakness AICD under left clavicle  Lab Results: Basic Metabolic Panel:  Basename 09/11/12 0453 09/10/12 2032  NA 141 137  K 4.6 3.9  CL 106 100  CO2 25 25  GLUCOSE 82 97  BUN 19 23  CREATININE 1.18* 1.30*  CALCIUM 9.2 9.8  MG -- --  PHOS -- --   CBC:  Basename 09/11/12 0453 09/10/12 2032  WBC 8.4 8.7  NEUTROABS -- --  HGB 11.9* 13.5  HCT 35.6* 39.5  MCV 94.2 94.0  PLT 256 284   Cardiac Enzymes:  Basename 09/10/12 2121  CKTOTAL --  CKMB --  CKMBINDEX --  TROPONINI <0.30    Imaging: No results found.  Cardiac Studies:  ECG:  Afib rate 102  Nonspecific St/T wave changes   Telemetry:  NSR 09/13/2012   Echo: pending  Medications:      . aspirin  325 mg Oral Daily  . diazepam  5 mg Oral BID  . metoprolol tartrate  25 mg Oral BID    . mometasone-formoterol  2 puff Inhalation BID  . rivaroxaban  20 mg Oral Daily  . sertraline  50 mg Oral Daily  . simvastatin  40 mg Oral QPM  . sodium chloride  3 mL Intravenous Q12H  . sodium chloride  3 mL Intravenous Q12H       Assessment/Plan:  PAF:  Single lead device with only one prolonged episode of high V rate presumed afib.  Episode PAF yesterday Continue calcium blocker and lopresser F/U Dr Ladona Ridgel and consider antiarrhythmic  Xarelto for anticoagulation  Anxiety:  Continue zoloft at 50 mg and valium at 5 bid Cholesterol continue statin  D/C home needs inhaler and valium scripts   Wants to be on newer anticoagulant as she cant get to coumadin clinic No transportation   Charlton Haws 09/13/2012, 7:40 AM

## 2012-09-13 NOTE — Telephone Encounter (Signed)
Received a call from [place] PA requesting samples Xarelto 20 mg daily, gathered and put up front (#20)

## 2012-09-13 NOTE — Progress Notes (Signed)
D/c orders received; IV removed with gauze on, pt remains in stable condition, pt meds and instructions reviewed and given to pt; informed pt to pick up 20 day supply of xarelto at [facility]; pt d/c to home

## 2012-09-13 NOTE — Telephone Encounter (Signed)
LMOVM reminding pt of appointment 09/20/12 Mylo Red RN

## 2012-09-13 NOTE — Care Management Note (Signed)
    Page 1 of 1   09/13/2012     11:17:41 AM   CARE MANAGEMENT NOTE 09/13/2012  Patient:  [name]   Account Number:  [id number]  Date Initiated:  09/13/2012  Documentation initiated by:  GRAVES-BIGELOW,Holland Kotter  Subjective/Objective Assessment:   Pt admitted with afib with RVR.CM did asist with xarelto. CM will fax forms to pt assistance with j&j. Pt should know if approoved in 3 business days.     Action/Plan:   Pt will be able to pick up samples at the [facility] office and she can contact J&j to see if approved.   Anticipated DC Date:  09/13/2012   Anticipated DC Plan:  HOME/SELF CARE      DC Planning Services  CM consult  Medication Assistance      Choice offered to / List presented to:             Status of service:  Completed, signed off Medicare Important Message given?   (If response is "NO", the following Medicare IM given date fields will be blank) Date Medicare IM given:   Date Additional Medicare IM given:    Discharge Disposition:  HOME/SELF CARE  Per UR Regulation:  Reviewed for med. necessity/level of care/duration of stay  If discussed at Long Length of Stay Meetings, dates discussed:    Comments:

## 2012-09-13 NOTE — Clinical Social Work Psychosocial (Signed)
     Clinical Social Work Department BRIEF PSYCHOSOCIAL ASSESSMENT 09/13/2012  Patient:  [name], [name]     Account Number:  [id number]     Admit date:  09/10/2012  Clinical Social Worker:  Margaree Mackintosh  Date/Time:  09/13/2012 12:00 M  Referred by:  Physician  Date Referred:  09/13/2012 Referred for  Advanced Directives  Transportation assistance   Other Referral:   Interview type:  Patient Other interview type:    PSYCHOSOCIAL DATA Living Status:  ALONE Admitted from facility:   Level of care:   Primary support name:  Tanja Port: [phone] Primary support relationship to patient:  CHILD, ADULT Degree of support available:   Unknown.    CURRENT CONCERNS Current Concerns  Other - See comment   Other Concerns:   Programme researcher, broadcasting/film/video.    SOCIAL WORK ASSESSMENT / PLAN Clinical Social Worker met with pt at bedside. CSW introduced self, explained role, and provided support.  CSW provided active listening. CSW validated pt's feelings. CSW reviewed HCPOA and GTA.  CSW provided opportunity for questions/concenrs to be addressed.  No other needs identified at this time; CSW to sign off.  Please re consult fi needed.   Assessment/plan status:  Information/Referral to [facility] Other assessment/ plan:   Information/referral to community resources:   [employer]    PATIENTS/FAMILYS RESPONSE TO PLAN OF CARE: pt was pleasant and engaged in conversation. Pt thanked CSW fo rintervention.

## 2012-09-20 ENCOUNTER — Telehealth: Payer: Self-pay

## 2012-09-20 ENCOUNTER — Encounter: Payer: Self-pay | Admitting: Nurse Practitioner

## 2012-09-20 ENCOUNTER — Encounter: Payer: Self-pay | Admitting: Cardiovascular Disease

## 2012-09-20 NOTE — Telephone Encounter (Signed)
Message copied by Marcelle Overlie on Mon Sep 20, 2012 10:37 AM ------      Message from: Thersa Salt      Created: Mon Sep 20, 2012 10:23 AM      Regarding: NS TCM pt       Pt NS appt today. States she confirmed but not sure about transportation and would call us to rs if she couldn't find someone

## 2012-10-07 ENCOUNTER — Encounter: Payer: Self-pay | Admitting: Internal Medicine

## 2012-10-08 ENCOUNTER — Encounter: Payer: Self-pay | Admitting: Internal Medicine

## 2012-10-16 ENCOUNTER — Telehealth: Payer: Self-pay | Admitting: Physician Assistant

## 2012-10-16 NOTE — Telephone Encounter (Signed)
Patient calls in to answering service. Patients notes rash on back diarrhea. Xarelto started 12/23. Rash and diarrhea started 1 week ago. Watery stools 4-5 times a day.  No blood. No fever. Rash is pruritic. No vomiting.  Slight nausea.  Also notes URI symptoms that have lingered for 3-4 weeks. She questioned if it could be from Xarelto. I suspect she has a viral gastroenteritis/syndrome. I advised her to be evaluated at Urgent Care this weekend. If rash continues, we could consider transitioning back to coumadin. Tereso Newcomer, PA-C  4:52 PM 10/16/2012

## 2012-10-18 ENCOUNTER — Ambulatory Visit: Payer: Self-pay | Admitting: Nurse Practitioner

## 2013-01-22 NOTE — Telephone Encounter (Signed)
Duplicate note. Entered in error

## 2013-01-30 ENCOUNTER — Other Ambulatory Visit: Payer: Self-pay | Admitting: Cardiovascular Disease

## 2013-01-31 ENCOUNTER — Other Ambulatory Visit (HOSPITAL_COMMUNITY): Payer: Self-pay | Admitting: Physician Assistant

## 2013-02-22 ENCOUNTER — Encounter: Payer: Self-pay | Admitting: Internal Medicine

## 2013-02-22 ENCOUNTER — Telehealth: Payer: Self-pay | Admitting: Internal Medicine

## 2013-02-22 NOTE — Telephone Encounter (Signed)
02-22-13 sent past due letter, needs taylor or brooke defib check/mt

## 2013-04-07 ENCOUNTER — Telehealth: Payer: Self-pay | Admitting: Internal Medicine

## 2013-04-07 NOTE — Telephone Encounter (Signed)
04-07-13 lmm @ 1237pm for pt to set up past due defib ck with taylor/brooke/mt

## 2013-04-21 ENCOUNTER — Encounter (HOSPITAL_COMMUNITY): Payer: Self-pay | Admitting: *Deleted

## 2013-04-21 ENCOUNTER — Emergency Department (HOSPITAL_COMMUNITY): Payer: Self-pay

## 2013-04-21 ENCOUNTER — Emergency Department (HOSPITAL_COMMUNITY)
Admission: EM | Admit: 2013-04-21 | Discharge: 2013-04-22 | Disposition: A | Discharge status: Home / Self Care | Payer: Self-pay | Attending: Emergency Medicine | Admitting: Emergency Medicine

## 2013-04-21 DIAGNOSIS — R197 Diarrhea, unspecified: Secondary | ICD-10-CM | POA: Insufficient documentation

## 2013-04-21 DIAGNOSIS — E785 Hyperlipidemia, unspecified: Secondary | ICD-10-CM | POA: Insufficient documentation

## 2013-04-21 DIAGNOSIS — F3289 Other specified depressive episodes: Secondary | ICD-10-CM | POA: Insufficient documentation

## 2013-04-21 DIAGNOSIS — Z7901 Long term (current) use of anticoagulants: Secondary | ICD-10-CM | POA: Insufficient documentation

## 2013-04-21 DIAGNOSIS — Z8669 Personal history of other diseases of the nervous system and sense organs: Secondary | ICD-10-CM | POA: Insufficient documentation

## 2013-04-21 DIAGNOSIS — F17200 Nicotine dependence, unspecified, uncomplicated: Secondary | ICD-10-CM | POA: Insufficient documentation

## 2013-04-21 DIAGNOSIS — I10 Essential (primary) hypertension: Secondary | ICD-10-CM | POA: Insufficient documentation

## 2013-04-21 DIAGNOSIS — F329 Major depressive disorder, single episode, unspecified: Secondary | ICD-10-CM | POA: Insufficient documentation

## 2013-04-21 DIAGNOSIS — Z95810 Presence of automatic (implantable) cardiac defibrillator: Secondary | ICD-10-CM | POA: Insufficient documentation

## 2013-04-21 DIAGNOSIS — R0789 Other chest pain: Secondary | ICD-10-CM | POA: Insufficient documentation

## 2013-04-21 DIAGNOSIS — J441 Chronic obstructive pulmonary disease with (acute) exacerbation: Secondary | ICD-10-CM | POA: Insufficient documentation

## 2013-04-21 DIAGNOSIS — Z8673 Personal history of transient ischemic attack (TIA), and cerebral infarction without residual deficits: Secondary | ICD-10-CM | POA: Insufficient documentation

## 2013-04-21 DIAGNOSIS — IMO0002 Reserved for concepts with insufficient information to code with codable children: Secondary | ICD-10-CM | POA: Insufficient documentation

## 2013-04-21 DIAGNOSIS — Z8679 Personal history of other diseases of the circulatory system: Secondary | ICD-10-CM | POA: Insufficient documentation

## 2013-04-21 DIAGNOSIS — R0602 Shortness of breath: Secondary | ICD-10-CM | POA: Insufficient documentation

## 2013-04-21 DIAGNOSIS — M1710 Unilateral primary osteoarthritis, unspecified knee: Secondary | ICD-10-CM | POA: Insufficient documentation

## 2013-04-21 DIAGNOSIS — J029 Acute pharyngitis, unspecified: Secondary | ICD-10-CM | POA: Insufficient documentation

## 2013-04-21 DIAGNOSIS — I4892 Unspecified atrial flutter: Secondary | ICD-10-CM | POA: Insufficient documentation

## 2013-04-21 DIAGNOSIS — I252 Old myocardial infarction: Secondary | ICD-10-CM | POA: Insufficient documentation

## 2013-04-21 DIAGNOSIS — I2510 Atherosclerotic heart disease of native coronary artery without angina pectoris: Secondary | ICD-10-CM | POA: Insufficient documentation

## 2013-04-21 DIAGNOSIS — Z79899 Other long term (current) drug therapy: Secondary | ICD-10-CM | POA: Insufficient documentation

## 2013-04-21 DIAGNOSIS — F411 Generalized anxiety disorder: Secondary | ICD-10-CM | POA: Insufficient documentation

## 2013-04-21 LAB — DIFFERENTIAL
Eosinophils Absolute: 0.2 10*3/uL (ref 0.0–0.7)
Eosinophils Relative: 3 % (ref 0–5)
Lymphocytes Relative: 26 % (ref 12–46)
Lymphs Abs: 2.1 10*3/uL (ref 0.7–4.0)
Monocytes Relative: 9 % (ref 3–12)
Neutrophils Relative %: 62 % (ref 43–77)

## 2013-04-21 LAB — CBC
HCT: 38.7 % (ref 36.0–46.0)
Hemoglobin: 13.8 g/dL (ref 12.0–15.0)
Platelets: 223 10*3/uL (ref 150–400)
RBC: 4.16 MIL/uL (ref 3.87–5.11)
WBC: 8.9 10*3/uL (ref 4.0–10.5)

## 2013-04-21 LAB — PRO B NATRIURETIC PEPTIDE: Pro B Natriuretic peptide (BNP): 297.2 pg/mL — ABNORMAL HIGH (ref 0–125)

## 2013-04-21 LAB — POCT I-STAT TROPONIN I

## 2013-04-21 LAB — BASIC METABOLIC PANEL
Calcium: 9.4 mg/dL (ref 8.4–10.5)
Creatinine, Ser: 1.35 mg/dL — ABNORMAL HIGH (ref 0.50–1.10)
GFR calc non Af Amer: 41 mL/min — ABNORMAL LOW (ref >90)
Sodium: 138 mEq/L (ref 135–145)

## 2013-04-21 LAB — PROTIME-INR: Prothrombin Time: 20.9 seconds — ABNORMAL HIGH (ref 11.6–15.2)

## 2013-04-21 MED ORDER — DIAZEPAM 5 MG/ML IJ SOLN
5.0000 mg | Freq: Once | INTRAMUSCULAR | Status: AC
Start: 2013-04-21 — End: 2013-04-21
  Administered 2013-04-21: 5 mg via INTRAVENOUS
  Filled 2013-04-21: qty 2

## 2013-04-21 MED ORDER — DILTIAZEM HCL 25 MG/5ML IV SOLN
20.0000 mg | Freq: Once | INTRAVENOUS | Status: AC
  Administered 2013-04-21: 20 mg via INTRAVENOUS
  Filled 2013-04-21: qty 5

## 2013-04-21 NOTE — ED Notes (Signed)
X-ray at bedside

## 2013-04-21 NOTE — ED Provider Notes (Signed)
CSN: [id number]     Arrival date & time 04/21/13  2153 History     First MD Initiated Contact with Patient 04/21/13 2255     Chief Complaint  Patient presents with  . Chest Pain   (Consider location/radiation/quality/duration/timing/severity/associated sxs/prior Treatment) HPI This is a 62 year old female with a history of paroxysmal atrial fibrillation. She states she felt her heart go into atrial fibrillation about an hour after eating dinner at 7 PM. She describes the symptoms as feeling her heart beat rapidly and irregularly. It was associated with sore throat, shortness of breath and chest pressure. The symptoms were mild to moderate. She was given one baby aspirin and one nitroglycerin by EMS. The nitroglycerin caused severe headache. She has also had diarrhea for several days. Her chest pressure and shortness of breath or not as severe as they were earlier.   Past Medical History  Diagnosis Date  . Coronary artery disease     non obstructive  . Stroke   . Seizures   . Anxiety   . Depression   . Migraine   . Fibromuscular dysplasia   . Tobacco abuse   . COPD (chronic obstructive pulmonary disease)   . Arthritis of knee     Bilateral  . Tobacco abuse   . Migraine headache   . Anxiety disorder   . Depression   . Seizure disorder   . Cerebrovascular disease   . CAD (coronary artery disease)   . Automatic implantable cardiac defibrillator in situ     [employer] ICD  . HTN (hypertension)   . HLD (hyperlipidemia)   . Ventricular fibrillation   . Long QT syndrome   . Cardiac arrest   . Atrial fibrillation     a) 2D echo 09/11/12: LVEF 55-60%, mild LVH, mld LA dilatation   Past Surgical History  Procedure Laterality Date  . Pacemaker insertion  05/01/2003    ICD, implantation of a Guidant single chamber defibrillator, Doylene Canning. Ladona Ridgel MD  . Cholecystectomy    . Tubal ligation    . Breast lumpectomy     Family History  Problem Relation Age of Onset  .  Dementia Mother   . Stroke Mother     Multiple  . Aneurysm Sister     Brain  . Uterine cancer Other     Grandmother  . Colon cancer Sister    History  Substance Use Topics  . Smoking status: Current Some Day Smoker -- 1.00 packs/day for 40 years    Types: Cigarettes  . Smokeless tobacco: Never Used     Comment: almost a pack a week  . Alcohol Use: No   OB History   Grav Para Term Preterm Abortions TAB SAB Ect Mult Living                 Review of Systems  All other systems reviewed and are negative.    Allergies  Azithromycin; Ibuprofen; Codeine; and Demerol  Home Medications   Current Outpatient Rx  Name  Route  Sig  Dispense  Refill  . acetaminophen (TYLENOL) 500 MG tablet   Oral   Take 1,000 mg by mouth every 6 (six) hours as needed for pain.         Marland Kitchen albuterol (PROVENTIL HFA;VENTOLIN HFA) 108 (90 BASE) MCG/ACT inhaler   Inhalation   Inhale 2 puffs into the lungs every 6 (six) hours as needed for wheezing.         . barrier cream (NON-SPECIFIED) CREA  Topical   Apply 1 application topically 3 (three) times daily as needed (for left elbow dry skin patch).         . diazepam (VALIUM) 5 MG tablet   Oral   Take 5 mg by mouth every 8 (eight) hours as needed for anxiety.         . diphenhydrAMINE (BENADRYL) 25 MG tablet   Oral   Take 25 mg by mouth every 6 (six) hours as needed for allergies.          . diphenhydramine-acetaminophen (TYLENOL PM) 25-500 MG TABS   Oral   Take 1 tablet by mouth at bedtime as needed (for sleep).          Marland Kitchen loperamide (IMODIUM A-D) 2 MG tablet   Oral   Take 4 mg by mouth 3 (three) times daily as needed for diarrhea or loose stools.         . metoprolol tartrate (LOPRESSOR) 25 MG tablet   Oral   Take 25 mg by mouth 2 (two) times daily.         . mometasone (NASONEX) 50 MCG/ACT nasal spray   Nasal   Place 2 sprays into the nose daily.         . ondansetron (ZOFRAN-ODT) 4 MG disintegrating tablet    Oral   Take 4 mg by mouth every 8 (eight) hours as needed for nausea.         . Rivaroxaban (XARELTO) 20 MG TABS   Oral   Take 20 mg by mouth daily.         . sertraline (ZOLOFT) 100 MG tablet   Oral   Take 150 mg by mouth at bedtime.         . simvastatin (ZOCOR) 20 MG tablet   Oral   Take 1 tablet (20 mg total) by mouth at bedtime.   30 tablet   3   . amoxicillin (AMOXIL) 500 MG capsule   Oral   Take 500 mg by mouth 2 (two) times daily.          BP 100/73  Pulse 90  Temp(Src) 97.7 F (36.5 C) (Oral)  Resp 25  SpO2 99%  Physical Exam General: Well-developed, well-nourished female in no acute distress; appearance consistent with age of record HENT: normocephalic, atraumatic Eyes: pupils equal round and reactive to light; extraocular muscles intact Neck: supple Heart: Irregular rhythm Lungs: Faint expiratory wheeze in right base Abdomen: soft; nondistended; nontender; bowel sounds present Extremities: No deformity; full range of motion; pulses normal Neurologic: Awake, alert and oriented; motor function intact in all extremities and symmetric; no facial droop Skin: Warm and dry Psychiatric: Normal mood and affect    ED Course   Procedures (including critical care time)    MDM   Nursing notes and vitals signs, including pulse oximetry, reviewed.  Summary of this visit's results, reviewed by myself:  Labs:  Results for orders placed during the hospital encounter of 04/21/13 (from the past 24 hour(s))  CBC     Status: None   Collection Time    04/21/13 10:33 PM      Result Value Range   WBC 8.9  4.0 - 10.5 K/uL   RBC 4.16  3.87 - 5.11 MIL/uL   Hemoglobin 13.8  12.0 - 15.0 g/dL   HCT 11.9  14.7 - 82.9 %   MCV 93.0  78.0 - 100.0 fL   MCH 33.2  26.0 - 34.0 pg   MCHC 35.7  30.0 - 36.0 g/dL   RDW 14.7  82.9 - 56.2 %   Platelets 223  150 - 400 K/uL  BASIC METABOLIC PANEL     Status: Abnormal   Collection Time    04/21/13 10:33 PM      Result  Value Range   Sodium 138  135 - 145 mEq/L   Potassium 3.7  3.5 - 5.1 mEq/L   Chloride 104  96 - 112 mEq/L   CO2 25  19 - 32 mEq/L   Glucose, Bld 98  70 - 99 mg/dL   BUN 18  6 - 23 mg/dL   Creatinine, Ser 1.30 (*) 0.50 - 1.10 mg/dL   Calcium 9.4  8.4 - 86.5 mg/dL   GFR calc non Af Amer 41 (*) >90 mL/min   GFR calc Af Amer 47 (*) >90 mL/min  PRO B NATRIURETIC PEPTIDE     Status: Abnormal   Collection Time    04/21/13 10:33 PM      Result Value Range   Pro B Natriuretic peptide (BNP) 297.2 (*) 0 - 125 pg/mL  PROTIME-INR     Status: Abnormal   Collection Time    04/21/13 10:33 PM      Result Value Range   Prothrombin Time 20.9 (*) 11.6 - 15.2 seconds   INR 1.86 (*) 0.00 - 1.49  DIFFERENTIAL     Status: None   Collection Time    04/21/13 10:33 PM      Result Value Range   Neutrophils Relative % 62  43 - 77 %   Neutro Abs 5.2  1.7 - 7.7 K/uL   Lymphocytes Relative 26  12 - 46 %   Lymphs Abs 2.1  0.7 - 4.0 K/uL   Monocytes Relative 9  3 - 12 %   Monocytes Absolute 0.8  0.1 - 1.0 K/uL   Eosinophils Relative 3  0 - 5 %   Eosinophils Absolute 0.2  0.0 - 0.7 K/uL   Basophils Relative 1  0 - 1 %   Basophils Absolute 0.1  0.0 - 0.1 K/uL  POCT I-STAT TROPONIN I     Status: None   Collection Time    04/21/13 10:54 PM      Result Value Range   Troponin i, poc 0.00  0.00 - 0.08 ng/mL   Comment 3           POCT I-STAT, CHEM 8     Status: Abnormal   Collection Time    04/22/13 12:10 AM      Result Value Range   Sodium 140  135 - 145 mEq/L   Potassium 3.9  3.5 - 5.1 mEq/L   Chloride 108  96 - 112 mEq/L   BUN 17  6 - 23 mg/dL   Creatinine, Ser 7.84 (*) 0.50 - 1.10 mg/dL   Glucose, Bld 97  70 - 99 mg/dL   Calcium, Ion 6.96  2.95 - 1.30 mmol/L   TCO2 23  0 - 100 mmol/L   Hemoglobin 15.0  12.0 - 15.0 g/dL   HCT 28.4  13.2 - 44.0 %    Imaging Studies: Dg Chest Port 1 View  04/21/2013   *RADIOLOGY REPORT*  Clinical Data: Chest pain and shortness of breath.  PORTABLE CHEST - 1 VIEW   Comparison: 07/20/2012  Findings: Stable appearance of implanted cardiac defibrillator. The heart size is within normal limits.  No pulmonary edema, infiltrate or pleural effusion is identified.  IMPRESSION: No active disease.  Stable appearance of defibrillator.   Original Report Authenticated By: Irish Lack, M.D.      EKG Interpretation:  Date & Time: 04/21/2013 10:08 PM  Rate: 106  Rhythm: atrial flutter  QRS Axis: normal  Intervals: normal  ST/T Wave abnormalities: indeterminate  Conduction Disutrbances: none  Narrative Interpretation:   Old EKG Reviewed: Previously atrial fibrillation  1:49 AM Patient in sinus bradycardia in the 50s after conversion with Cardizem 20 mg IV.      Hanley Seamen, MD 04/22/13 [phone]

## 2013-04-21 NOTE — ED Notes (Addendum)
Per EMS: pt coming from home with c/o chest pain and A-fib. Pt states onset was shortly after eating a hot dog. Pt reports shortness of breath with onset. Pt was given one baby aspirin and one nitro. Pt also c/o diarrhea for a few days. Pt is A&Ox4, respirations equal and unlabored, skin warm and dry

## 2013-04-21 NOTE — ED Notes (Signed)
Lab at bedside

## 2013-04-22 LAB — POCT I-STAT, CHEM 8
BUN: 17 mg/dL (ref 6–23)
Creatinine, Ser: 1.3 mg/dL — ABNORMAL HIGH (ref 0.50–1.10)
Glucose, Bld: 97 mg/dL (ref 70–99)
Hemoglobin: 15 g/dL (ref 12.0–15.0)
Potassium: 3.9 mEq/L (ref 3.5–5.1)
Sodium: 140 mEq/L (ref 135–145)

## 2013-04-22 MED ORDER — MORPHINE SULFATE 2 MG/ML IJ SOLN
2.0000 mg | Freq: Once | INTRAMUSCULAR | Status: AC
  Administered 2013-04-22: 2 mg via INTRAVENOUS
  Filled 2013-04-22: qty 1

## 2013-04-25 ENCOUNTER — Encounter: Payer: Self-pay | Admitting: Internal Medicine

## 2013-04-25 ENCOUNTER — Telehealth: Payer: Self-pay | Admitting: Internal Medicine

## 2013-04-25 NOTE — Telephone Encounter (Addendum)
02-22-13 sent past due letter/mt 04-07-13 lmm @ 1237pm/mt 04-25-13 sent certified letter/mt 4-78-29 CERT LETTER MAIL RTN/MT

## 2013-05-10 ENCOUNTER — Encounter: Payer: Self-pay | Admitting: Internal Medicine

## 2013-05-10 ENCOUNTER — Telehealth: Payer: Self-pay | Admitting: Internal Medicine

## 2013-05-10 NOTE — Telephone Encounter (Signed)
error 

## 2013-05-16 ENCOUNTER — Other Ambulatory Visit: Payer: Self-pay

## 2013-05-16 MED ORDER — RIVAROXABAN 20 MG PO TABS: 20.0000 mg | ORAL_TABLET | Freq: Every day | ORAL | Status: DC

## 2013-05-17 ENCOUNTER — Other Ambulatory Visit: Payer: Self-pay | Admitting: *Deleted

## 2013-05-17 MED ORDER — RIVAROXABAN 20 MG PO TABS: 20.0000 mg | ORAL_TABLET | Freq: Every day | ORAL | Status: DC

## 2013-07-05 ENCOUNTER — Emergency Department (HOSPITAL_BASED_OUTPATIENT_CLINIC_OR_DEPARTMENT_OTHER): Payer: Self-pay

## 2013-07-05 ENCOUNTER — Emergency Department (HOSPITAL_BASED_OUTPATIENT_CLINIC_OR_DEPARTMENT_OTHER)
Admission: EM | Admit: 2013-07-05 | Discharge: 2013-07-05 | Disposition: A | Discharge status: Home / Self Care | Payer: Self-pay | Attending: Emergency Medicine | Admitting: Emergency Medicine

## 2013-07-05 ENCOUNTER — Encounter (HOSPITAL_BASED_OUTPATIENT_CLINIC_OR_DEPARTMENT_OTHER): Payer: Self-pay | Admitting: Emergency Medicine

## 2013-07-05 DIAGNOSIS — I10 Essential (primary) hypertension: Secondary | ICD-10-CM | POA: Insufficient documentation

## 2013-07-05 DIAGNOSIS — Z8673 Personal history of transient ischemic attack (TIA), and cerebral infarction without residual deficits: Secondary | ICD-10-CM | POA: Insufficient documentation

## 2013-07-05 DIAGNOSIS — F329 Major depressive disorder, single episode, unspecified: Secondary | ICD-10-CM | POA: Insufficient documentation

## 2013-07-05 DIAGNOSIS — J449 Chronic obstructive pulmonary disease, unspecified: Secondary | ICD-10-CM | POA: Insufficient documentation

## 2013-07-05 DIAGNOSIS — I2510 Atherosclerotic heart disease of native coronary artery without angina pectoris: Secondary | ICD-10-CM | POA: Insufficient documentation

## 2013-07-05 DIAGNOSIS — M1710 Unilateral primary osteoarthritis, unspecified knee: Secondary | ICD-10-CM | POA: Insufficient documentation

## 2013-07-05 DIAGNOSIS — IMO0002 Reserved for concepts with insufficient information to code with codable children: Secondary | ICD-10-CM | POA: Insufficient documentation

## 2013-07-05 DIAGNOSIS — G43909 Migraine, unspecified, not intractable, without status migrainosus: Secondary | ICD-10-CM | POA: Insufficient documentation

## 2013-07-05 DIAGNOSIS — Z79899 Other long term (current) drug therapy: Secondary | ICD-10-CM | POA: Insufficient documentation

## 2013-07-05 DIAGNOSIS — F3289 Other specified depressive episodes: Secondary | ICD-10-CM | POA: Insufficient documentation

## 2013-07-05 DIAGNOSIS — F410 Panic disorder [episodic paroxysmal anxiety] without agoraphobia: Secondary | ICD-10-CM | POA: Insufficient documentation

## 2013-07-05 DIAGNOSIS — R0789 Other chest pain: Secondary | ICD-10-CM | POA: Insufficient documentation

## 2013-07-05 DIAGNOSIS — J4489 Other specified chronic obstructive pulmonary disease: Secondary | ICD-10-CM | POA: Insufficient documentation

## 2013-07-05 DIAGNOSIS — R42 Dizziness and giddiness: Secondary | ICD-10-CM | POA: Insufficient documentation

## 2013-07-05 DIAGNOSIS — Z792 Long term (current) use of antibiotics: Secondary | ICD-10-CM | POA: Insufficient documentation

## 2013-07-05 DIAGNOSIS — Z8674 Personal history of sudden cardiac arrest: Secondary | ICD-10-CM | POA: Insufficient documentation

## 2013-07-05 DIAGNOSIS — Z7901 Long term (current) use of anticoagulants: Secondary | ICD-10-CM | POA: Insufficient documentation

## 2013-07-05 DIAGNOSIS — Z95810 Presence of automatic (implantable) cardiac defibrillator: Secondary | ICD-10-CM | POA: Insufficient documentation

## 2013-07-05 DIAGNOSIS — F17200 Nicotine dependence, unspecified, uncomplicated: Secondary | ICD-10-CM | POA: Insufficient documentation

## 2013-07-05 DIAGNOSIS — E785 Hyperlipidemia, unspecified: Secondary | ICD-10-CM | POA: Insufficient documentation

## 2013-07-05 LAB — CBC WITH DIFFERENTIAL/PLATELET
Basophils Relative: 1 % (ref 0–1)
Eosinophils Absolute: 0.3 10*3/uL (ref 0.0–0.7)
Eosinophils Relative: 4 % (ref 0–5)
HCT: 40.5 % (ref 36.0–46.0)
Hemoglobin: 13.8 g/dL (ref 12.0–15.0)
Lymphs Abs: 1.9 10*3/uL (ref 0.7–4.0)
MCH: 32.2 pg (ref 26.0–34.0)
MCHC: 34.1 g/dL (ref 30.0–36.0)
MCV: 94.6 fL (ref 78.0–100.0)
Monocytes Absolute: 0.6 10*3/uL (ref 0.1–1.0)
Monocytes Relative: 8 % (ref 3–12)
RBC: 4.28 MIL/uL (ref 3.87–5.11)

## 2013-07-05 LAB — BASIC METABOLIC PANEL
BUN: 26 mg/dL — ABNORMAL HIGH (ref 6–23)
Calcium: 10.3 mg/dL (ref 8.4–10.5)
Creatinine, Ser: 1.3 mg/dL — ABNORMAL HIGH (ref 0.50–1.10)
GFR calc Af Amer: 50 mL/min — ABNORMAL LOW (ref >90)
GFR calc non Af Amer: 43 mL/min — ABNORMAL LOW (ref >90)
Glucose, Bld: 143 mg/dL — ABNORMAL HIGH (ref 70–99)
Potassium: 3.5 mEq/L (ref 3.5–5.1)

## 2013-07-05 MED ORDER — DIAZEPAM 5 MG PO TABS: 5.0000 mg | ORAL_TABLET | Freq: Three times a day (TID) | ORAL | Status: DC | PRN

## 2013-07-05 NOTE — ED Notes (Signed)
Lives at [facility] states she called ems because she got upset and was having some sharp chest pains they had resolved when ems arrived. Reportedly a neighbor passed away recently and "everyone has been upset" has history of anxiety but not currently on meds

## 2013-07-05 NOTE — ED Notes (Signed)
Portable cxr performed.

## 2013-07-05 NOTE — ED Provider Notes (Addendum)
CSN: [id number]     Arrival date & time 07/05/13  1543 History   First MD Initiated Contact with Patient 07/05/13 1545     Chief Complaint  Patient presents with  . Chest Pain  . Anxiety   (Consider location/radiation/quality/duration/timing/severity/associated sxs/prior Treatment) Patient is a 62 y.o. female presenting with chest pain and anxiety. The history is provided by the patient.  Chest Pain Pain location:  Substernal area Pain quality: sharp   Pain radiates to:  Does not radiate Pain radiates to the back: no   Pain severity:  Moderate Onset quality:  Sudden Duration:  15 minutes Timing:  Constant Progression:  Resolved Chronicity:  Recurrent Context comment:  Started while watching TV today but had been really upset and stressed today due to death of a close friend this week Relieved by: better after getting up and walking around. Worsened by:  Nothing tried Ineffective treatments:  None tried Associated symptoms: anxiety and dizziness   Associated symptoms: no abdominal pain, no cough, no diaphoresis, no near-syncope, no palpitations, not vomiting and no weakness   Risk factors: coronary artery disease, hypertension and smoking   Risk factors: no diabetes mellitus and no prior DVT/PE   Anxiety Associated symptoms include chest pain. Pertinent negatives include no abdominal pain.    Past Medical History  Diagnosis Date  . Coronary artery disease     non obstructive  . Stroke   . Seizures   . Anxiety   . Depression   . Migraine   . Fibromuscular dysplasia   . Tobacco abuse   . COPD (chronic obstructive pulmonary disease)   . Arthritis of knee     Bilateral  . Tobacco abuse   . Migraine headache   . Anxiety disorder   . Depression   . Seizure disorder   . Cerebrovascular disease   . CAD (coronary artery disease)   . Automatic implantable cardiac defibrillator in situ     [employer] ICD  . HTN (hypertension)   . HLD (hyperlipidemia)   .  Ventricular fibrillation   . Long QT syndrome   . Cardiac arrest   . Atrial fibrillation     a) 2D echo 09/11/12: LVEF 55-60%, mild LVH, mld LA dilatation   Past Surgical History  Procedure Laterality Date  . Pacemaker insertion  05/01/2003    ICD, implantation of a Guidant single chamber defibrillator, Doylene Canning. Ladona Ridgel MD  . Cholecystectomy    . Tubal ligation    . Breast lumpectomy     Family History  Problem Relation Age of Onset  . Dementia Mother   . Stroke Mother     Multiple  . Aneurysm Sister     Brain  . Uterine cancer Other     Grandmother  . Colon cancer Sister    History  Substance Use Topics  . Smoking status: Current Some Day Smoker -- 1.00 packs/day for 40 years    Types: Cigarettes  . Smokeless tobacco: Never Used     Comment: almost a pack a week  . Alcohol Use: No   OB History   Grav Para Term Preterm Abortions TAB SAB Ect Mult Living                 Review of Systems  Constitutional: Negative for diaphoresis.  Respiratory: Negative for cough.   Cardiovascular: Positive for chest pain. Negative for palpitations and near-syncope.  Gastrointestinal: Negative for vomiting and abdominal pain.  Neurological: Positive for dizziness. Negative for weakness.  All other systems reviewed and are negative.    Allergies  Azithromycin; Ibuprofen; Codeine; and Demerol  Home Medications   Current Outpatient Rx  Name  Route  Sig  Dispense  Refill  . acetaminophen (TYLENOL) 500 MG tablet   Oral   Take 1,000 mg by mouth every 6 (six) hours as needed for pain.         Marland Kitchen albuterol (PROVENTIL HFA;VENTOLIN HFA) 108 (90 BASE) MCG/ACT inhaler   Inhalation   Inhale 2 puffs into the lungs every 6 (six) hours as needed for wheezing.         Marland Kitchen amoxicillin (AMOXIL) 500 MG capsule   Oral   Take 500 mg by mouth 2 (two) times daily.         . barrier cream (NON-SPECIFIED) CREA   Topical   Apply 1 application topically 3 (three) times daily as needed (for  left elbow dry skin patch).         . diazepam (VALIUM) 5 MG tablet   Oral   Take 5 mg by mouth every 8 (eight) hours as needed for anxiety.         . diphenhydrAMINE (BENADRYL) 25 MG tablet   Oral   Take 25 mg by mouth every 6 (six) hours as needed for allergies.          . diphenhydramine-acetaminophen (TYLENOL PM) 25-500 MG TABS   Oral   Take 1 tablet by mouth at bedtime as needed (for sleep).          Marland Kitchen loperamide (IMODIUM A-D) 2 MG tablet   Oral   Take 4 mg by mouth 3 (three) times daily as needed for diarrhea or loose stools.         . metoprolol tartrate (LOPRESSOR) 25 MG tablet   Oral   Take 25 mg by mouth 2 (two) times daily.         . mometasone (NASONEX) 50 MCG/ACT nasal spray   Nasal   Place 2 sprays into the nose daily.         . ondansetron (ZOFRAN-ODT) 4 MG disintegrating tablet   Oral   Take 4 mg by mouth every 8 (eight) hours as needed for nausea.         . Rivaroxaban (XARELTO) 20 MG TABS tablet   Oral   Take 1 tablet (20 mg total) by mouth daily.   30 tablet   0     PATIENT NEEDS TO CALL OUR OFFICE TO SCHEDULE A FOL ...   . sertraline (ZOLOFT) 100 MG tablet   Oral   Take 150 mg by mouth at bedtime.         . simvastatin (ZOCOR) 20 MG tablet   Oral   Take 1 tablet (20 mg total) by mouth at bedtime.   30 tablet   3    BP 101/67  Pulse 74  Temp(Src) 98.5 F (36.9 C) (Oral)  Resp 20  SpO2 100% Physical Exam  Nursing note and vitals reviewed. Constitutional: She is oriented to person, place, and time. She appears well-developed and well-nourished. No distress.  Recently been crying but now calm  HENT:  Head: Normocephalic and atraumatic.  Mouth/Throat: Oropharynx is clear and moist.  Eyes: Conjunctivae and EOM are normal. Pupils are equal, round, and reactive to light.  Neck: Normal range of motion. Neck supple.  Cardiovascular: Normal rate, regular rhythm and intact distal pulses.   No murmur heard. Pulmonary/Chest:  Effort normal and breath sounds  normal. No respiratory distress. She has no wheezes. She has no rales.  Abdominal: Soft. She exhibits no distension. There is no tenderness. There is no rebound and no guarding.  Musculoskeletal: Normal range of motion. She exhibits no edema and no tenderness.  Neurological: She is alert and oriented to person, place, and time.  Skin: Skin is warm and dry. No rash noted. No erythema.  Psychiatric: She has a normal mood and affect. Her behavior is normal.    ED Course  Procedures (including critical care time) Labs Review Labs Reviewed  CBC WITH DIFFERENTIAL  BASIC METABOLIC PANEL  TROPONIN I   Imaging Review No results found.  EKG Interpretation   None       Date: 07/05/2013  Rate: 79  Rhythm: normal sinus rhythm  QRS Axis: normal  Intervals: normal  ST/T Wave abnormalities: nonspecific T wave changes  Conduction Disutrbances:first-degree A-V block   Narrative Interpretation:   Old EKG Reviewed: none available   MDM   1. Atypical chest pain   2. Panic attack     Patient presenting with chest pain that was sharp in nature in the substernal region that started while she was watching TV today and lasted approximately 15 minutes. She states she's been extremely stressed and anxious because of the death of a close friend and felt like today was a panic attack however given her prior history of heart problems she wanted to be evaluated. Currently patient is symptom-free and states that the panic attack is also resolved. She takes Valium for her anxiety and Zoloft. She is still taking Xarelto. Exam is benign for acute findings.  Feel that this is a relatively low risk ACS story so we'll rule out with Dr. troponins. Pain started at 2:30 so we'll check a troponin now and again at 6:30.  7:01 PM Delta trop neg.  Feel most likely stress and panic related.  Gwyneth Sprout, MD 07/05/13 1901  Gwyneth Sprout, MD 07/05/13 1902  Gwyneth Sprout, MD 07/05/13 [phone]

## 2013-07-05 NOTE — ED Notes (Signed)
Pt reports recent loss of close friend and has had increasing anxiety and depression lately.  Denies si.  Reports cp episode today possibly panic attack.  Relieved with exertion b/c it "took my mind off things."

## 2013-07-05 NOTE — ED Notes (Signed)
MD at bedside. 

## 2013-07-27 ENCOUNTER — Telehealth: Payer: Self-pay | Admitting: Cardiovascular Disease

## 2013-07-27 NOTE — Telephone Encounter (Signed)
New message    Pt is on xeralto---she is hearing a lot of bad things about this medication.  Is this medication safe to take?

## 2013-07-27 NOTE — Telephone Encounter (Signed)
I spoke with the pt and made her aware that we recommend continuing Xarelto.  I made her aware that this medication has not been pulled off the market.

## 2013-07-28 ENCOUNTER — Encounter (HOSPITAL_BASED_OUTPATIENT_CLINIC_OR_DEPARTMENT_OTHER): Payer: Self-pay | Admitting: Emergency Medicine

## 2013-07-28 ENCOUNTER — Emergency Department (HOSPITAL_BASED_OUTPATIENT_CLINIC_OR_DEPARTMENT_OTHER): Payer: Self-pay

## 2013-07-28 ENCOUNTER — Emergency Department (HOSPITAL_BASED_OUTPATIENT_CLINIC_OR_DEPARTMENT_OTHER)
Admission: EM | Admit: 2013-07-28 | Discharge: 2013-07-28 | Disposition: A | Discharge status: Home / Self Care | Payer: Self-pay | Attending: Emergency Medicine | Admitting: Emergency Medicine

## 2013-07-28 DIAGNOSIS — Z79899 Other long term (current) drug therapy: Secondary | ICD-10-CM | POA: Insufficient documentation

## 2013-07-28 DIAGNOSIS — W1809XA Striking against other object with subsequent fall, initial encounter: Secondary | ICD-10-CM | POA: Insufficient documentation

## 2013-07-28 DIAGNOSIS — I4901 Ventricular fibrillation: Secondary | ICD-10-CM | POA: Insufficient documentation

## 2013-07-28 DIAGNOSIS — Z950 Presence of cardiac pacemaker: Secondary | ICD-10-CM | POA: Insufficient documentation

## 2013-07-28 DIAGNOSIS — I2510 Atherosclerotic heart disease of native coronary artery without angina pectoris: Secondary | ICD-10-CM | POA: Insufficient documentation

## 2013-07-28 DIAGNOSIS — E785 Hyperlipidemia, unspecified: Secondary | ICD-10-CM | POA: Insufficient documentation

## 2013-07-28 DIAGNOSIS — Y929 Unspecified place or not applicable: Secondary | ICD-10-CM | POA: Insufficient documentation

## 2013-07-28 DIAGNOSIS — Z8674 Personal history of sudden cardiac arrest: Secondary | ICD-10-CM | POA: Insufficient documentation

## 2013-07-28 DIAGNOSIS — IMO0002 Reserved for concepts with insufficient information to code with codable children: Secondary | ICD-10-CM | POA: Insufficient documentation

## 2013-07-28 DIAGNOSIS — Y939 Activity, unspecified: Secondary | ICD-10-CM | POA: Insufficient documentation

## 2013-07-28 DIAGNOSIS — M1710 Unilateral primary osteoarthritis, unspecified knee: Secondary | ICD-10-CM | POA: Insufficient documentation

## 2013-07-28 DIAGNOSIS — I10 Essential (primary) hypertension: Secondary | ICD-10-CM | POA: Insufficient documentation

## 2013-07-28 DIAGNOSIS — S79919A Unspecified injury of unspecified hip, initial encounter: Secondary | ICD-10-CM | POA: Insufficient documentation

## 2013-07-28 DIAGNOSIS — S4980XA Other specified injuries of shoulder and upper arm, unspecified arm, initial encounter: Secondary | ICD-10-CM | POA: Insufficient documentation

## 2013-07-28 DIAGNOSIS — S0993XA Unspecified injury of face, initial encounter: Secondary | ICD-10-CM | POA: Insufficient documentation

## 2013-07-28 DIAGNOSIS — G40909 Epilepsy, unspecified, not intractable, without status epilepticus: Secondary | ICD-10-CM | POA: Insufficient documentation

## 2013-07-28 DIAGNOSIS — W19XXXA Unspecified fall, initial encounter: Secondary | ICD-10-CM

## 2013-07-28 DIAGNOSIS — Z792 Long term (current) use of antibiotics: Secondary | ICD-10-CM | POA: Insufficient documentation

## 2013-07-28 DIAGNOSIS — I4891 Unspecified atrial fibrillation: Secondary | ICD-10-CM | POA: Insufficient documentation

## 2013-07-28 DIAGNOSIS — F329 Major depressive disorder, single episode, unspecified: Secondary | ICD-10-CM | POA: Insufficient documentation

## 2013-07-28 DIAGNOSIS — F17200 Nicotine dependence, unspecified, uncomplicated: Secondary | ICD-10-CM | POA: Insufficient documentation

## 2013-07-28 DIAGNOSIS — F3289 Other specified depressive episodes: Secondary | ICD-10-CM | POA: Insufficient documentation

## 2013-07-28 DIAGNOSIS — R040 Epistaxis: Secondary | ICD-10-CM | POA: Insufficient documentation

## 2013-07-28 DIAGNOSIS — S298XXA Other specified injuries of thorax, initial encounter: Secondary | ICD-10-CM | POA: Insufficient documentation

## 2013-07-28 DIAGNOSIS — S46909A Unspecified injury of unspecified muscle, fascia and tendon at shoulder and upper arm level, unspecified arm, initial encounter: Secondary | ICD-10-CM | POA: Insufficient documentation

## 2013-07-28 DIAGNOSIS — F411 Generalized anxiety disorder: Secondary | ICD-10-CM | POA: Insufficient documentation

## 2013-07-28 DIAGNOSIS — Z95810 Presence of automatic (implantable) cardiac defibrillator: Secondary | ICD-10-CM | POA: Insufficient documentation

## 2013-07-28 DIAGNOSIS — Z8673 Personal history of transient ischemic attack (TIA), and cerebral infarction without residual deficits: Secondary | ICD-10-CM | POA: Insufficient documentation

## 2013-07-28 MED ORDER — HYDROCODONE-ACETAMINOPHEN 5-325 MG PO TABS: 1.0000 | ORAL_TABLET | Freq: Once | ORAL | Status: DC

## 2013-07-28 NOTE — ED Notes (Signed)
Pt ambulated around the unit with no problems.

## 2013-07-28 NOTE — ED Notes (Signed)
Pt reports a nosebleed PTA and sinus pain.

## 2013-07-28 NOTE — ED Provider Notes (Signed)
CSN: [id number]     Arrival date & time 07/28/13  1218 History   First MD Initiated Contact with Patient 07/28/13 1318     Chief Complaint  Patient presents with  . Epistaxis  . Fall   (Consider location/radiation/quality/duration/timing/severity/associated sxs/prior Treatment) HPI Comments: 62 yo female with epistaxis on Xarelto which has resolved prior to arrival.  She also had a mechanical fall yesterday hitting her left side. She is unsure if she hit her head, but did at least jar it.  No LOC.  No vomiting.   Patient is a 62 y.o. female presenting with nosebleeds and fall.  Epistaxis Location:  L nare Severity:  Moderate Duration:  1 hour Timing:  Constant Progression:  Resolved Chronicity:  New Context: anticoagulants   Relieved by:  Applying pressure Worsened by:  Nothing tried Associated symptoms: no congestion, no cough and no fever   Fall This is a new problem. The current episode started yesterday. Episode frequency: once. Pertinent negatives include no chest pain, no abdominal pain and no shortness of breath. Associated symptoms comments: Left chest wall pain.  . Nothing aggravates the symptoms.    Past Medical History  Diagnosis Date  . Coronary artery disease     non obstructive  . Stroke   . Seizures   . Anxiety   . Depression   . Migraine   . Fibromuscular dysplasia   . Tobacco abuse   . COPD (chronic obstructive pulmonary disease)   . Arthritis of knee     Bilateral  . Tobacco abuse   . Migraine headache   . Anxiety disorder   . Depression   . Seizure disorder   . Cerebrovascular disease   . CAD (coronary artery disease)   . Automatic implantable cardiac defibrillator in situ     [employer] ICD  . HTN (hypertension)   . HLD (hyperlipidemia)   . Ventricular fibrillation   . Long QT syndrome   . Cardiac arrest   . Atrial fibrillation     a) 2D echo 09/11/12: LVEF 55-60%, mild LVH, mld LA dilatation   Past Surgical History  Procedure  Laterality Date  . Pacemaker insertion  05/01/2003    ICD, implantation of a Guidant single chamber defibrillator, Doylene Canning. Ladona Ridgel MD  . Cholecystectomy    . Tubal ligation    . Breast lumpectomy     Family History  Problem Relation Age of Onset  . Dementia Mother   . Stroke Mother     Multiple  . Aneurysm Sister     Brain  . Uterine cancer Other     Grandmother  . Colon cancer Sister    History  Substance Use Topics  . Smoking status: Current Some Day Smoker -- 1.00 packs/day for 40 years    Types: Cigarettes  . Smokeless tobacco: Never Used     Comment: almost a pack a week  . Alcohol Use: No   OB History   Grav Para Term Preterm Abortions TAB SAB Ect Mult Living                 Review of Systems  Constitutional: Negative for fever.  HENT: Positive for nosebleeds. Negative for congestion.   Respiratory: Negative for cough and shortness of breath.   Cardiovascular: Negative for chest pain.  Gastrointestinal: Negative for nausea, vomiting, abdominal pain and diarrhea.  All other systems reviewed and are negative.    Allergies  Azithromycin; Ibuprofen; Codeine; and Demerol  Home Medications  Current Outpatient Rx  Name  Route  Sig  Dispense  Refill  . acetaminophen (TYLENOL) 500 MG tablet   Oral   Take 1,000 mg by mouth every 6 (six) hours as needed for pain.         Marland Kitchen albuterol (PROVENTIL HFA;VENTOLIN HFA) 108 (90 BASE) MCG/ACT inhaler   Inhalation   Inhale 2 puffs into the lungs every 6 (six) hours as needed for wheezing.         Marland Kitchen amoxicillin (AMOXIL) 500 MG capsule   Oral   Take 500 mg by mouth 2 (two) times daily.         . barrier cream (NON-SPECIFIED) CREA   Topical   Apply 1 application topically 3 (three) times daily as needed (for left elbow dry skin patch).         . diazepam (VALIUM) 5 MG tablet   Oral   Take 5 mg by mouth every 8 (eight) hours as needed for anxiety.         . diazepam (VALIUM) 5 MG tablet   Oral   Take 1  tablet (5 mg total) by mouth every 8 (eight) hours as needed for anxiety.   15 tablet   0   . diphenhydrAMINE (BENADRYL) 25 MG tablet   Oral   Take 25 mg by mouth every 6 (six) hours as needed for allergies.          . diphenhydramine-acetaminophen (TYLENOL PM) 25-500 MG TABS   Oral   Take 1 tablet by mouth at bedtime as needed (for sleep).          Marland Kitchen loperamide (IMODIUM A-D) 2 MG tablet   Oral   Take 4 mg by mouth 3 (three) times daily as needed for diarrhea or loose stools.         . metoprolol tartrate (LOPRESSOR) 25 MG tablet   Oral   Take 25 mg by mouth 2 (two) times daily.         . mometasone (NASONEX) 50 MCG/ACT nasal spray   Nasal   Place 2 sprays into the nose daily.         . ondansetron (ZOFRAN-ODT) 4 MG disintegrating tablet   Oral   Take 4 mg by mouth every 8 (eight) hours as needed for nausea.         . Rivaroxaban (XARELTO) 20 MG TABS tablet   Oral   Take 1 tablet (20 mg total) by mouth daily.   30 tablet   0     PATIENT NEEDS TO CALL OUR OFFICE TO SCHEDULE A FOL ...   . sertraline (ZOLOFT) 100 MG tablet   Oral   Take 150 mg by mouth at bedtime.         . simvastatin (ZOCOR) 20 MG tablet   Oral   Take 1 tablet (20 mg total) by mouth at bedtime.   30 tablet   3    BP 130/89  Pulse 75  Temp(Src) 98.3 F (36.8 C) (Oral)  Resp 20  Ht 5\' 4"  (1.626 m)  Wt 150 lb (68.04 kg)  BMI 25.73 kg/m2  SpO2 99% Physical Exam  Nursing note and vitals reviewed. Constitutional: She is oriented to person, place, and time. She appears well-developed and well-nourished. No distress.  HENT:  Head: Normocephalic and atraumatic. Head is without raccoon's eyes and without Battle's sign.  Nose: Nose normal.  Eyes: Conjunctivae and EOM are normal. Pupils are equal, round, and reactive to light. No  scleral icterus.  Neck: Muscular tenderness (left) present. No spinous process tenderness present.  Cardiovascular: Normal rate, regular rhythm, normal heart  sounds and intact distal pulses.   No murmur heard. Pulmonary/Chest: Effort normal and breath sounds normal. She has no rales. She exhibits no tenderness.  Abdominal: Soft. There is no tenderness. There is no rebound and no guarding.  Musculoskeletal: Normal range of motion. She exhibits no edema.       Left shoulder: She exhibits tenderness. She exhibits normal range of motion, no bony tenderness, no swelling, no effusion and no crepitus.       Left hip: She exhibits tenderness. She exhibits normal range of motion, normal strength, no bony tenderness, no swelling, no crepitus, no deformity and no laceration.       Thoracic back: She exhibits no tenderness and no bony tenderness.       Lumbar back: She exhibits no tenderness and no bony tenderness.  No evidence of trauma to extremities, except as noted.  2+ distal pulses.    Neurological: She is alert and oriented to person, place, and time.  Skin: Skin is warm and dry. No rash noted.  Psychiatric: She has a normal mood and affect.    ED Course  Procedures (including critical care time) Labs Review Labs Reviewed - No data to display Imaging Review Dg Chest 2 View  07/28/2013   CLINICAL DATA:  Complains of left-sided chest pain.  EXAM: CHEST  2 VIEW  COMPARISON:  07/05/2013  FINDINGS: The heart size and mediastinal contours are within normal limits. Both lungs are clear. The visualized skeletal structures are unremarkable. A left-sided pectoral SP cm and is appreciated with lead tip projecting in the region the right atrium.  IMPRESSION: No active cardiopulmonary disease.   Electronically Signed   By: Salome Holmes M.D.   On: 07/28/2013 14:14   Ct Head Wo Contrast  07/28/2013   CLINICAL DATA:  Fall, at least axis. History of stroke, seizure and migraines.  EXAM: CT HEAD WITHOUT CONTRAST  TECHNIQUE: Contiguous axial images were obtained from the base of the skull through the vertex without intravenous contrast.  COMPARISON:  CT of the head  March 27, 2013.  FINDINGS: The ventricles and sulci are normal for age. No intraparenchymal hemorrhage, mass effect nor midline shift. Patchy supratentorial white matter hypodensities are within normal range for patient's age and though non-specific suggest sequelae of chronic small vessel ischemic disease. Patient's known small left inferior cerebellar lacunar infarct less apparent on this examination due to beam hardening artifact. Remote left coronal radiata lacunar infarct. No acute large vascular territory infarcts.  No abnormal extra-axial fluid collections. Basal cisterns are patent. Moderate calcific atherosclerosis of the carotid siphons.  No skull fracture. Visualized paranasal sinuses and mastoid aircells are well-aerated. The included ocular globes and orbital contents are non-suspicious.  IMPRESSION: No acute intracranial process.  Stable appearance of the head: Mild to moderate white matter changes suggest chronic small vessel ischemic disease.   Electronically Signed   By: Awilda Metro   On: 07/28/2013 14:21  All radiology studies independently viewed by me.     EKG Interpretation   None       MDM   1. Epistaxis   2. Fall, initial encounter    Epistaxis resolved prior to arrival and did not return.  Advised nasal saline spray and return precautions.    Head CT and CXR negative.  No other significant injuries appreciated on exam.  Able to ambulate.  Tylenol for pain.      Candyce Churn, MD 07/28/13 [phone]

## 2013-09-09 ENCOUNTER — Other Ambulatory Visit: Payer: Self-pay | Admitting: *Deleted

## 2013-09-09 MED ORDER — RIVAROXABAN 20 MG PO TABS: 20.0000 mg | ORAL_TABLET | Freq: Every day | ORAL | Status: DC

## 2013-10-21 ENCOUNTER — Other Ambulatory Visit: Payer: Self-pay | Admitting: *Deleted

## 2013-10-21 ENCOUNTER — Telehealth: Payer: Self-pay | Admitting: *Deleted

## 2013-10-21 MED ORDER — RIVAROXABAN 20 MG PO TABS: 20.0000 mg | ORAL_TABLET | Freq: Every day | ORAL | Status: DC

## 2013-10-21 NOTE — Telephone Encounter (Signed)
Patient called for xarelto samples, she stated that she cannot afford this. She previously was getting this from the manufacturer, but her card has expired. She is going to fill out the forms and bring them to her appointment for the doctor to complete his portion to see if she can get this re-started.

## 2013-10-31 ENCOUNTER — Telehealth: Payer: Self-pay | Admitting: Cardiovascular Disease

## 2013-10-31 NOTE — Telephone Encounter (Signed)
Patient tells me she ran out of her Xarelto samples last night.  [place] and hopes to get re-enrolled when she sees her cardiologist tomorrow.  She tells me she will be getting health insurance in 11/2013 so this may prevent her from using patient assistance.  She can't afford Xarelto without insurance and wants to know what to do tonight.   Patient instructed to take aspirin 81 mg tonight and to bring her [employer] [place] [place] card and any paperwork to get re-enrolled with her tomorrow to office visit.  May be able to get her some more samples until she gets enrolled, or until she gets insurance.  Patient agreeable to this.

## 2013-10-31 NOTE — Telephone Encounter (Signed)
New problem   Pt is out of Xarelto and need to speak to someone concerning this.

## 2013-11-01 ENCOUNTER — Encounter: Payer: Self-pay | Admitting: Cardiovascular Disease

## 2013-11-01 ENCOUNTER — Encounter (INDEPENDENT_AMBULATORY_CARE_PROVIDER_SITE_OTHER): Payer: Self-pay

## 2013-11-01 ENCOUNTER — Ambulatory Visit (INDEPENDENT_AMBULATORY_CARE_PROVIDER_SITE_OTHER): Payer: Self-pay | Admitting: *Deleted

## 2013-11-01 ENCOUNTER — Ambulatory Visit (INDEPENDENT_AMBULATORY_CARE_PROVIDER_SITE_OTHER): Payer: Self-pay | Admitting: Cardiovascular Disease

## 2013-11-01 ENCOUNTER — Other Ambulatory Visit: Payer: Self-pay

## 2013-11-01 VITALS — BP 110/68 | HR 64 | Ht 64.0 in | Wt 169.0 lb

## 2013-11-01 DIAGNOSIS — Z95810 Presence of automatic (implantable) cardiac defibrillator: Secondary | ICD-10-CM

## 2013-11-01 DIAGNOSIS — I480 Paroxysmal atrial fibrillation: Secondary | ICD-10-CM

## 2013-11-01 DIAGNOSIS — I4581 Long QT syndrome: Secondary | ICD-10-CM

## 2013-11-01 DIAGNOSIS — I4891 Unspecified atrial fibrillation: Secondary | ICD-10-CM

## 2013-11-01 MED ORDER — RIVAROXABAN 15 MG PO TABS: 15.0000 mg | ORAL_TABLET | Freq: Every day | ORAL | Status: DC

## 2013-11-01 NOTE — Progress Notes (Signed)
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 She also has history of paroxysmal atrial fibrillation. She has been treated with metoprolol. She is known to have anxiety. She had recurrent admissions in 2013 for atrial fibrillation with rapid ventricular response. No recent hospitalization . she reportshaving an average of one episode a month of atrial fibrillation. This usually lasts for 1-4 hours.   she is on anticoagulation with Xarelto. Recent GFR was 43.    Allergies  Allergen Reactions  . Azithromycin Other (See Comments)    Hypotension   . Ibuprofen Other (See Comments)    Hypotension. Takes ASA without problem hypotension  . Codeine Rash  . Demerol [Meperidine] Rash     Current Outpatient Prescriptions on File Prior to Visit  Medication Sig Dispense Refill  . acetaminophen (TYLENOL) 500 MG tablet Take 1,000 mg by mouth every 6 (six) hours as needed for pain.      Marland Kitchen. albuterol (PROVENTIL HFA;VENTOLIN HFA) 108 (90 BASE) MCG/ACT inhaler Inhale 2 puffs into the lungs every 6 (six) hours as needed for wheezing.      Marland Kitchen. amoxicillin (AMOXIL) 500 MG capsule Take 500 mg by mouth 2 (two) times daily.      . diphenhydrAMINE (BENADRYL) 25 MG tablet Take 25 mg by mouth every 6 (six) hours as needed for allergies.       . diphenhydramine-acetaminophen (TYLENOL PM) 25-500 MG TABS Take 1 tablet by mouth at bedtime as needed (for sleep).       Marland Kitchen. loperamide (IMODIUM A-D) 2 MG tablet Take 4 mg by mouth 3 (three) times daily as needed for diarrhea or loose stools.      . metoprolol tartrate (LOPRESSOR) 25 MG tablet Take 25 mg by mouth 2 (two) times daily.      . mometasone (NASONEX) 50 MCG/ACT nasal spray Place 2 sprays into the nose daily.      . sertraline (ZOLOFT) 100 MG tablet Take 150 mg by mouth at bedtime.      . simvastatin (ZOCOR) 20 MG tablet Take 1 tablet (20 mg total) by mouth at bedtime.  30 tablet  3   No  current facility-administered medications on file prior to visit.     Past Medical History  Diagnosis Date  . Coronary artery disease     non obstructive  . Stroke   . Seizures   . Anxiety   . Depression   . Migraine   . Fibromuscular dysplasia   . Tobacco abuse   . COPD (chronic obstructive pulmonary disease)   . Arthritis of knee     Bilateral  . Tobacco abuse   . Migraine headache   . Anxiety disorder   . Depression   . Seizure disorder   . Cerebrovascular disease   . CAD (coronary artery disease)   . Automatic implantable cardiac defibrillator in situ     [employer] ICD  . HTN (hypertension)   . HLD (hyperlipidemia)   . Ventricular fibrillation   . Long QT syndrome   . Cardiac arrest   . Atrial fibrillation     a) 2D echo 09/11/12: LVEF 55-60%, mild LVH, mld LA dilatation     Past Surgical History  Procedure Laterality Date  . Pacemaker insertion  05/01/2003    ICD, implantation of a Guidant single chamber defibrillator, Doylene CanningGregg W. Ladona Ridgelaylor MD  . Cholecystectomy    . Tubal ligation    .  Breast lumpectomy       Family History  Problem Relation Age of Onset  . Dementia Mother   . Stroke Mother     Multiple  . Aneurysm Sister     Brain  . Uterine cancer Other     Grandmother  . Colon cancer Sister      History   Social History  . Marital Status: Divorced    Spouse Name: N/A    Number of Children: N/A  . Years of Education: N/A   Occupational History  . Disabled    Social History Main Topics  . Smoking status: Current Some Day Smoker -- 1.00 packs/day for 40 years    Types: Cigarettes  . Smokeless tobacco: Never Used     Comment: almost a pack a week  . Alcohol Use: No  . Drug Use: No  . Sexual Activity: No   Other Topics Concern  . Not on file   Social History Narrative   Lives in [place]   On disability for her osteoarthritis   Recently estranged from her husband      PHYSICAL EXAM   BP 110/68  Pulse 64  Ht 5\' 4"   (1.626 m)  Wt 169 lb (76.658 kg)  BMI 28.99 kg/m2 Constitutional: She is oriented to person, place, and time. She appears well-developed and well-nourished. No distress.  HENT: No nasal discharge.  Head: Normocephalic and atraumatic.  Eyes: Pupils are equal and round. Right eye exhibits no discharge. Left eye exhibits no discharge.  Neck: Normal range of motion. Neck supple. No JVD present. No thyromegaly present.  Cardiovascular: Normal rate, regular rhythm, normal heart sounds. Exam reveals no gallop and no friction rub. No murmur heard.  Pulmonary/Chest: Effort normal and breath sounds normal. No stridor. No respiratory distress. She has no wheezes. She has no rales. She exhibits no tenderness.  Abdominal: Soft. Bowel sounds are normal. She exhibits no distension. There is no tenderness. There is no rebound and no guarding.  Musculoskeletal: Normal range of motion. She exhibits no edema and no tenderness.  Neurological: She is alert and oriented to person, place, and time. Coordination normal.  Skin: Skin is warm and dry. No rash noted. She is not diaphoretic. No erythema. No pallor.  Psychiatric: She has a normal mood and affect. Her behavior is normal. Judgment and thought content normal.      ASSESSMENT AND PLAN

## 2013-11-01 NOTE — Patient Instructions (Signed)
Your physician wants you to follow-up in: 6 Months with Dr. Kirke CorinArida.   You will receive a reminder letter in the mail two months in advance. If you don't receive a letter, please call our office to schedule the follow-up appointment.  The EP clinic interrogated your device today.  Your physician has recommended you make the following change in your medication: Xarelto 15mg  By Mouth ONCE A DAY  Continue with your same medications as listed.

## 2013-11-03 LAB — MDC_IDC_ENUM_SESS_TYPE_INCLINIC
Date Time Interrogation Session: 20150210050000
HighPow Impedance: 42 Ohm
HighPow Impedance: 60 Ohm
Implantable Pulse Generator Serial Number: 100111
Lead Channel Impedance Value: 897 Ohm
Lead Channel Pacing Threshold Pulse Width: 0.4 ms
Lead Channel Setting Pacing Amplitude: 2.4 V
Lead Channel Setting Pacing Pulse Width: 0.4 ms
MDC IDC MSMT LEADCHNL RV PACING THRESHOLD AMPLITUDE: 0.9 V
MDC IDC MSMT LEADCHNL RV SENSING INTR AMPL: 13.2 mV
MDC IDC SET LEADCHNL RV SENSING SENSITIVITY: 0.5 mV
Zone Setting Detection Interval: 286 ms
Zone Setting Detection Interval: 353 ms

## 2013-11-03 NOTE — Progress Notes (Signed)
ICD check in clinic. Normal device function. Thresholds and sensing consistent with previous device measurements. Impedance trends stable over time. 314 NSVT---EGMs show irreg Vs + xarelto. Histogram distribution appropriate for patient and level of activity. No changes made this session. Device programmed at appropriate safety margins. Device programmed to optimize intrinsic conduction. Estimated longevity 11.733yrs.  ROV w/ GT 02/01/14

## 2013-11-04 DIAGNOSIS — I480 Paroxysmal atrial fibrillation: Secondary | ICD-10-CM | POA: Insufficient documentation

## 2013-11-04 NOTE — Assessment & Plan Note (Signed)
Continue treatment with metoprolol. An antiarrhythmic medication can be considered if symptoms become more frequent. However, this has to be chosen carefully given her previous history of QT prolongation. Decrease Xarelto to 15 mg once daily due to abnormal kidney function.

## 2013-11-04 NOTE — Assessment & Plan Note (Signed)
We arranged for device interrogation today. She needs to followup in the device clinic on a regular basis.

## 2013-11-22 ENCOUNTER — Encounter: Payer: Self-pay | Admitting: Internal Medicine

## 2013-11-29 ENCOUNTER — Other Ambulatory Visit: Payer: Self-pay | Admitting: *Deleted

## 2013-11-29 MED ORDER — SIMVASTATIN 20 MG PO TABS: 20.0000 mg | ORAL_TABLET | Freq: Every day | ORAL | Status: DC

## 2013-11-29 MED ORDER — METOPROLOL TARTRATE 25 MG PO TABS: 25.0000 mg | ORAL_TABLET | Freq: Two times a day (BID) | ORAL | Status: DC

## 2013-12-11 ENCOUNTER — Emergency Department (HOSPITAL_BASED_OUTPATIENT_CLINIC_OR_DEPARTMENT_OTHER)
Admission: EM | Admit: 2013-12-11 | Discharge: 2013-12-12 | Disposition: A | Discharge status: Home / Self Care | Payer: Self-pay | Attending: Emergency Medicine | Admitting: Emergency Medicine

## 2013-12-11 ENCOUNTER — Encounter (HOSPITAL_BASED_OUTPATIENT_CLINIC_OR_DEPARTMENT_OTHER): Payer: Self-pay | Admitting: Emergency Medicine

## 2013-12-11 DIAGNOSIS — G43909 Migraine, unspecified, not intractable, without status migrainosus: Secondary | ICD-10-CM | POA: Insufficient documentation

## 2013-12-11 DIAGNOSIS — Z792 Long term (current) use of antibiotics: Secondary | ICD-10-CM | POA: Insufficient documentation

## 2013-12-11 DIAGNOSIS — Z8673 Personal history of transient ischemic attack (TIA), and cerebral infarction without residual deficits: Secondary | ICD-10-CM | POA: Insufficient documentation

## 2013-12-11 DIAGNOSIS — Z79899 Other long term (current) drug therapy: Secondary | ICD-10-CM | POA: Insufficient documentation

## 2013-12-11 DIAGNOSIS — F17200 Nicotine dependence, unspecified, uncomplicated: Secondary | ICD-10-CM | POA: Insufficient documentation

## 2013-12-11 DIAGNOSIS — I10 Essential (primary) hypertension: Secondary | ICD-10-CM | POA: Insufficient documentation

## 2013-12-11 DIAGNOSIS — J4489 Other specified chronic obstructive pulmonary disease: Secondary | ICD-10-CM | POA: Insufficient documentation

## 2013-12-11 DIAGNOSIS — Z95810 Presence of automatic (implantable) cardiac defibrillator: Secondary | ICD-10-CM | POA: Insufficient documentation

## 2013-12-11 DIAGNOSIS — G4700 Insomnia, unspecified: Secondary | ICD-10-CM | POA: Insufficient documentation

## 2013-12-11 DIAGNOSIS — G40909 Epilepsy, unspecified, not intractable, without status epilepticus: Secondary | ICD-10-CM | POA: Insufficient documentation

## 2013-12-11 DIAGNOSIS — R32 Unspecified urinary incontinence: Secondary | ICD-10-CM | POA: Insufficient documentation

## 2013-12-11 DIAGNOSIS — IMO0002 Reserved for concepts with insufficient information to code with codable children: Secondary | ICD-10-CM | POA: Insufficient documentation

## 2013-12-11 DIAGNOSIS — F329 Major depressive disorder, single episode, unspecified: Secondary | ICD-10-CM | POA: Insufficient documentation

## 2013-12-11 DIAGNOSIS — I2510 Atherosclerotic heart disease of native coronary artery without angina pectoris: Secondary | ICD-10-CM | POA: Insufficient documentation

## 2013-12-11 DIAGNOSIS — R630 Anorexia: Secondary | ICD-10-CM | POA: Insufficient documentation

## 2013-12-11 DIAGNOSIS — F4321 Adjustment disorder with depressed mood: Secondary | ICD-10-CM | POA: Insufficient documentation

## 2013-12-11 DIAGNOSIS — R110 Nausea: Secondary | ICD-10-CM | POA: Insufficient documentation

## 2013-12-11 DIAGNOSIS — Z8739 Personal history of other diseases of the musculoskeletal system and connective tissue: Secondary | ICD-10-CM | POA: Insufficient documentation

## 2013-12-11 DIAGNOSIS — F3289 Other specified depressive episodes: Secondary | ICD-10-CM | POA: Insufficient documentation

## 2013-12-11 DIAGNOSIS — Z7901 Long term (current) use of anticoagulants: Secondary | ICD-10-CM | POA: Insufficient documentation

## 2013-12-11 DIAGNOSIS — E785 Hyperlipidemia, unspecified: Secondary | ICD-10-CM | POA: Insufficient documentation

## 2013-12-11 DIAGNOSIS — J449 Chronic obstructive pulmonary disease, unspecified: Secondary | ICD-10-CM | POA: Insufficient documentation

## 2013-12-11 NOTE — ED Provider Notes (Signed)
CSN: [id number]     Arrival date & time 12/11/13  2006 History   This chart was scribed for Hanley SeamenJohn L Ethelmae Ringel, MD by Blanchard KelchNicole Curnes, ED Scribe. The patient was seen in room MH07/MH07. Patient's care was started at 11:51 PM.      Chief Complaint  Patient presents with  . Anxiety     The history is provided by the patient. No language interpreter was used.    HPI Comments: [name] is a 63 y.o. female who presents to the Emergency Department complaining of anxiety that began nine days ago after a close family member was admitted to the hospital and acutely worsened when the family member passed away yesterday morning. She states that this episode has made her "question her mortality" for the first time. She reports associated insomnia, loss of appetite, nausea and urinary incontinence. She also reports occasional chest pain lasting a second or two. She denies taking any medication for the current episode of anxiety. She denies suicidal ideations.    Past Medical History  Diagnosis Date  . Coronary artery disease     non obstructive  . Stroke   . Seizures   . Anxiety   . Depression   . Migraine   . Fibromuscular dysplasia   . Tobacco abuse   . COPD (chronic obstructive pulmonary disease)   . Arthritis of knee     Bilateral  . Tobacco abuse   . Migraine headache   . Anxiety disorder   . Depression   . Seizure disorder   . Cerebrovascular disease   . CAD (coronary artery disease)   . Automatic implantable cardiac defibrillator in situ     [employer] ICD  . HTN (hypertension)   . HLD (hyperlipidemia)   . Ventricular fibrillation   . Long QT syndrome   . Cardiac arrest   . Atrial fibrillation     a) 2D echo 09/11/12: LVEF 55-60%, mild LVH, mld LA dilatation   Past Surgical History  Procedure Laterality Date  . Pacemaker insertion  05/01/2003    ICD, implantation of a Guidant single chamber defibrillator, Doylene CanningGregg W. Ladona Ridgelaylor MD  . Cholecystectomy    . Tubal ligation     . Breast lumpectomy     Family History  Problem Relation Age of Onset  . Dementia Mother   . Stroke Mother     Multiple  . Aneurysm Sister     Brain  . Uterine cancer Other     Grandmother  . Colon cancer Sister    History  Substance Use Topics  . Smoking status: Current Some Day Smoker -- 1.00 packs/day for 40 years    Types: Cigarettes  . Smokeless tobacco: Never Used     Comment: almost a pack a week  . Alcohol Use: No   OB History   Grav Para Term Preterm Abortions TAB SAB Ect Mult Living                 Review of Systems A complete 10 system review of systems was obtained and all systems are negative except as noted in the HPI and PMH.     Allergies  Azithromycin; Ibuprofen; Codeine; and Demerol  Home Medications   Current Outpatient Rx  Name  Route  Sig  Dispense  Refill  . albuterol (PROVENTIL HFA;VENTOLIN HFA) 108 (90 BASE) MCG/ACT inhaler   Inhalation   Inhale 2 puffs into the lungs every 6 (six) hours as needed for wheezing.         .Marland Kitchen  metoprolol tartrate (LOPRESSOR) 25 MG tablet   Oral   Take 1 tablet (25 mg total) by mouth 2 (two) times daily.   180 tablet   1   . mometasone (NASONEX) 50 MCG/ACT nasal spray   Nasal   Place 2 sprays into the nose daily.         . Rivaroxaban (XARELTO) 15 MG TABS tablet   Oral   Take 1 tablet (15 mg total) by mouth daily with supper.   30 tablet   4   . sertraline (ZOLOFT) 100 MG tablet   Oral   Take 150 mg by mouth at bedtime.         . simvastatin (ZOCOR) 20 MG tablet   Oral   Take 1 tablet (20 mg total) by mouth at bedtime.   90 tablet   0   . acetaminophen (TYLENOL) 500 MG tablet   Oral   Take 1,000 mg by mouth every 6 (six) hours as needed for pain.         Marland Kitchen amoxicillin (AMOXIL) 500 MG capsule   Oral   Take 500 mg by mouth 2 (two) times daily.         Marland Kitchen EXPIRED: clonazePAM (KLONOPIN) 1 MG tablet      1 mg. As diretced         . diazepam (VALIUM) 5 MG tablet   Oral   Take  1 tablet (5 mg total) by mouth every 8 (eight) hours as needed for anxiety.   15 tablet   0   . diphenhydrAMINE (BENADRYL) 25 MG tablet   Oral   Take 25 mg by mouth every 6 (six) hours as needed for allergies.          . diphenhydramine-acetaminophen (TYLENOL PM) 25-500 MG TABS   Oral   Take 1 tablet by mouth at bedtime as needed (for sleep).          Marland Kitchen loperamide (IMODIUM A-D) 2 MG tablet   Oral   Take 4 mg by mouth 3 (three) times daily as needed for diarrhea or loose stools.          Triage Vitals: BP 137/89  Pulse 124  Temp(Src) 99.5 F (37.5 C) (Oral)  Resp 22  Ht 5\' 4"  (1.626 m)  Wt 168 lb (76.204 kg)  BMI 28.82 kg/m2  SpO2 97%  Physical Exam  Nursing note and vitals reviewed. General: Well-developed, well-nourished female in no acute distress; appearance consistent with age of record HENT: normocephalic; atraumatic Eyes: pupils equal, round and reactive to light; extraocular muscles intact Neck: supple Heart: regular rate and rhythm; no murmurs, rubs or gallops Lungs: clear to auscultation bilaterally Abdomen: soft; nondistended; nontender; no masses or hepatosplenomegaly; bowel sounds present Extremities: No deformity; full range of motion; pulses normal Neurologic: Awake, alert and oriented; motor function intact in all extremities and symmetric; no facial droop Skin: Warm and dry Psychiatric: Normal mood and affect   ED Course  Procedures (including critical care time)  DIAGNOSTIC STUDIES: Oxygen Saturation is 97% on room air, adequate by my interpretation.    COORDINATION OF CARE: 11:56 PM -Patient states that she drove herself here. Will include resource guide for mental health follow up per patient's request. She has had multiple prescriptions for Clonazepam and Diazepam within the past year. She was prescribed 50 tablets of 60, 1 mg Clonazepam on 3/2, which she states that she lost in the hospital this week. She was advised that we do not refill  lost or stolen prescriptions in the ED, but will provide a short course of the medication pending followup with her PCP. Patient verbalizes understanding and agrees with treatment plan.   MDM   Final diagnoses:  Grief reaction    I personally performed the services described in this documentation, which was scribed in my presence. The recorded information has been reviewed and is accurate.     Hanley Seamen, MD 12/12/13 [phone]

## 2013-12-11 NOTE — ED Notes (Signed)
Pt reports [facility] and she die this morning pt feels overwhelmed. Denies SI / HI or AV hallucinations. States feeling down and depressed and unable to function in her ADL's. Affect flat mood labile but behavior self controlled . Pt denies issues of drug or alcohol .

## 2013-12-11 NOTE — ED Notes (Addendum)
Recent death of close family member- pt states "i need something to calm me down"- denies SI- c/o panic attack today and stomach hurts and she can't eat.

## 2013-12-12 MED ORDER — DIAZEPAM 5 MG PO TABS: 5.0000 mg | ORAL_TABLET | Freq: Three times a day (TID) | ORAL | Status: DC | PRN

## 2013-12-12 NOTE — ED Notes (Signed)
Resource guide given prior to d/c

## 2013-12-12 NOTE — Discharge Instructions (Signed)
Grief Reaction Grief is a normal response to the death of someone close to you. Feelings of fear, anger, and guilt can affect almost everyone who loses someone they love. Symptoms of depression are also common. These include problems with sleep, loss of appetite, and lack of energy. These grief reaction symptoms often last for weeks to months after a loss. They may also return during special times that remind you of the person you lost, such as an anniversary or birthday. Anxiety, insomnia, irritability, and deep depression may last beyond the period of normal grief. If you experience these feelings for 6 months or longer, you may have clinical depression. Clinical depression requires further medical attention. If you think that you have clinical depression, you should contact your caregiver. If you have a history of depression and or a family history of depression, you are at greater risk of clinical depression. You are also at greater risk of developing clinical depression if the loss was traumatic or the loss was of someone with whom you had unresolved issues.  A grief reaction can become complicated by being blocked. This means being unable to cry or express extreme emotions. This may prolong the grieving period and worsen the emotional effects of the loss. Mourning is a natural event in human life. A healthy grief reaction is one that is not blocked . It requires a time of sadness and readjustment.It is very important to share your sorrow and fear with others, especially close friends and family. Professional counselors and clergy can also help you process your grief. Document Released: 09/08/2005 Document Revised: 12/01/2011 Document Reviewed: 05/19/2006 [facility] Patient Information 2014 [place].    Emergency Department Resource Guide 1) Find a Doctor and Pay Out of Pocket Although you won't have to find out who is covered by your insurance plan, it is a good idea to ask around and get  recommendations. You will then need to call the office and see if the doctor you have chosen will accept you as a new patient and what types of options they offer for patients who are self-pay. Some doctors offer discounts or will set up payment plans for their patients who do not have insurance, but you will need to ask so you aren't surprised when you get to your appointment.  2) Contact Your Local Health Department Not all health departments have doctors that can see patients for sick visits, but many do, so it is worth a call to see if yours does. If you don't know where your local health department is, you can check in your phone book. The CDC also has a tool to help you locate your state's health department, and many state websites also have listings of all of their local health departments.  3) Find a Walk-in Clinic If your illness is not likely to be very severe or complicated, you may want to try a walk in clinic. These are popping up all over the country in pharmacies, drugstores, and shopping centers. They're usually staffed by nurse practitioners or physician assistants that have been trained to treat common illnesses and complaints. They're usually fairly quick and inexpensive. However, if you have serious medical issues or chronic medical problems, these are probably not your best option.  No Primary Care Doctor: - Call [facility] at  [phone] - they can help you locate a primary care doctor that  accepts your insurance, provides certain services, etc. - Physician Referral Service- [phone]  Chronic Pain Problems: Organization  Address  Phone   Notes  °[facility]  (336) 297-2271 Patients need to be referred by their primary care doctor.  ° °Medication Assistance: °Organization         Address  Phone   Notes  °[place] [facility] [address] °[place], [place] [address] (336) 641-8030 --Must be a resident of  [place] °-- Must have NO insurance coverage whatsoever (no Medicaid/ Medicare, etc.) °-- The pt. MUST have a primary care doctor that directs their care regularly and follows them in the community °  °[facility]  (866) 331-1348   °[employer]  (888) 892-1162   ° °Agencies that provide inexpensive medical care: °Organization         Address  Phone   Notes  °[facility]  (336) 832-8035   °[facility]    (336) 832-7272   °[facility] [address] °[place], [place] [address] (336) 832-4777   °[facility] [address], °[place] (336) 271-4999   °[facility]    (336) 373-0678   °[facility]    (336) 272-1050   °[facility] ° [address], [place] Phone:  (336) 832-4444, Fax:  (336) 832-4440 Hours of Operation:  9 am - 6 pm, M-F.  Also accepts Medicaid/Medicare and self-pay.  °[facility] ° [address], [place] Phone: (336) [phone], Fax: (336) [phone]. Hours of Operation:  8:30 am - 5:30 pm, M-F.  Also accepts Medicaid and self-pay.  °[facility] [place] [address], [place] Phone: (336) 878-6027   °[facility] [address], [place], [place] (336)723-1848, Ext. 123 Mondays & Thursdays: 7-9 AM.  First 15 patients are seen on a first come, first serve basis. °  ° °Medicaid-accepting [place] Providers: ° °Organization         Address  Phone   Notes  °[facility] [address], [place] (336) 641-2100 Also accepts self-pay patients.  °[facility] [address], [place] ° (336) 856-9996   °[facility] [address], [place] (336) 288-8857   °[facility] [address] [place] [address], [place] (336) 299-7000   °Veita Bland [address], [place]  ° (336) 373-1557 Only accepts [place] Access  Medicaid patients after they have their name applied to their card.  ° °Self-Pay (no insurance) in [place]: ° °Organization         Address  Phone   Notes  °Sickle Cell Patients, [facility] [address], [place] (336) 832-1970   °[facility] [address], [place] (336) 832-4400   °[facility] ° [address], [place] (336) 992-4800   °[facility]/Dr. Osei-Bonsu ° [address] [place] [address], [place] or [address], [place] (336) 841-8500 Phone number for both [place] and [place] locations is the same.  °[facility] [address], [place] (336) 299-0000   °[facility] [address] [place] [address], [place] or [address] (336) 852-7530 °(336) 878-2260   °[facility] [address], [place] (336) 350-1642, phone; (336) 294-5005, fax Sees patients 1st and 3rd Saturday of every month.  Must not qualify   for public or private insurance (i.e. Medicaid, Medicare, [place] Health Choice, Veterans' Benefits) • Household income should be no more than 200% of the poverty level •The clinic cannot treat you if you are pregnant or think you are pregnant • Sexually transmitted diseases are not treated at the clinic.  ° ° °Dental Care: °Organization         Address  Phone  Notes  °[place] [facility] [address], [place] (336) 641-6152 Accepts children up to age 21 who are enrolled in Medicaid or [place] Health Choice; pregnant women with a Medicaid card; and children who have applied for Medicaid or [place] Health Choice, but were declined, whose parents can pay a reduced fee at time of service.  °[facility] [place]  [address], [place] (336) 641-7733 Accepts children up to age 21 who are enrolled in Medicaid or [place] Health Choice; pregnant women with a Medicaid  card; and children who have applied for Medicaid or [place] Health Choice, but were declined, whose parents can pay a reduced fee at time of service.  °[facility] ° [address], [place] (336) 641-4533 Patients are seen by appointment only. Walk-ins are not accepted. [facility] will see patients 18 years of age and older. °Monday - Tuesday (8am-5pm) °Most Wednesdays (8:30-5pm) °$30 per visit, cash only  °[facility] ° [address], [place] (336) 641-4533 Patients are seen by appointment only. Walk-ins are not accepted. [facility] will see patients 18 years of age and older. °One Wednesday Evening (Monthly: Volunteer Based).  $30 per visit, cash only  °[facility]  (919) 537-3737 for adults; Children under age 4, call Graduate Pediatric Dentistry at ([phone]) [phone]. Children aged 4-14, please call ([phone]) [phone] to request a pediatric application. ° Dental services are provided in all areas of dental care including fillings, crowns and bridges, complete and partial dentures, implants, gum treatment, root canals, and extractions. Preventive care is also provided. Treatment is provided to both adults and children. °Patients are selected via a lottery and there is often a waiting list. °  °[facility] [address], °[place] ° (336) 763-8833 [email or web address] °  °[facility] [address], [place], [place] (336)723-1848, Ext. 123 Second and Fourth Thursday of each month, opens at 6:30 AM; Clinic ends at 9 AM.  Patients are seen on a first-come first-served basis, and a limited number are seen during each clinic.  ° °[facility] ° [address], [place], [place] (336) 723-7904   Eligibility Requirements °You must have lived in [place], [place], or [place] counties for at least the last three months. °  You cannot be eligible for state or federal sponsored healthcare insurance,  including Veterans Administration, Medicaid, or Medicare. °  You generally cannot be eligible for healthcare insurance through your employer.  °  How to apply: °Eligibility screenings are held every Tuesday and Wednesday afternoon from 1:00 pm until 4:00 pm. You do not need an appointment for the interview!  °[facility] [address], [place], [place] 336-631-2330   °[place] [facility]  336-342-8273   °[facility]  336-703-3100   °[facility]  336-570-6415   ° °Behavioral Health Resources in the Community: °Intensive Outpatient Programs °Organization         Address  Phone    Notes  °[facility] [address], [place], [place] 336-878-6098   °[facility] [address], [place], [place] 336-832-9800   °[facility] [address], [place], [place] ° 336-882-2125   °[facility] [address],  °[place], [place] 1-800-853-5163 or 336-641-4981   °Substance Abuse Resources °Organization         Address  Phone  Notes  °[facility]  336-882-2125   °[facility]  336-784-9470   °[facility]  336-285-9073   °[facility]  336-845-3988   °Residential & Outpatient Substance Abuse Program  1-800-659-3381   °Psychological Services °Organization         Address  Phone  Notes  ° Health  336- 832-9600   °[facility]  336- 378-7881   °[facility] [address], [place] 1-800-853-5163 or 336-641-4981   ° °Mobile Crisis Teams °Organization         Address  Phone  Notes  °[facility], Mobile Crisis Care Unit  1-877-626-1772   °Assertive °[facility] ° [address] [place], [place] 336-834-9664   °Sharon DeEsch [address] °[place] 336-554-5454   ° °Self-Help/Support Groups °Organization         Address  Phone             Notes  °[facility] [place] - variety of support groups  336- 373-1402 Call for more information  °Narcotics Anonymous (NA), [facility] [address], °[place]  2 meetings at this location  ° °Residential Treatment Programs °Organization         Address  Phone  Notes  °[facility] [address],    °[place]  1-866-801-8205   °[facility] ° [address], [place], [place] 704-293-8524   °[facility] [address], [place] 336-845-3988 Admissions: 8am-3pm M-F  °[facility] [address]    °[place], [place] 336-841-1104   °[facility] [address], [place], [place] 336-379-7146   °[facility] [address]  °[place], [place] 336-285-9073   °[facility] - Intensive Outpatient [address], [place], [place] 336-852-3033   °[facility] [address]  °[place], [place] 1-877-615-2722 or 336-784-9470   °[facility] [address] [place], [place] 336-227-7417 Accepts Medicaid  °[facility] [address]  °[place] 1-800-659-3381 Substance Abuse/Addiction Treatment  ° °[place] Behavioral Health Resources °Organization         Address  Phone  Notes  °[facility]  (888) 581-9988   °Julie Brannon, PhD [address] [place], [place]   (336) 349-5553 or (336) 951-0000   °[facility]   [address] °[place], [place] (336) 349-4454   °[facility] [address], [place], [place] (336) 342-8316 Insurance/Medicaid/sponsorship through [facility]  °[facility] [address]                                    [place], [place] (336) 342-8316 Therapy/tele-psych/case  °[facility] [address]  ° [place], [place] (336) 349-2233    °Dr. Arfeen  (336) 349-4544   °[facility] [place]  [employer] [place]   [facility] 1) [address], [place] 2)  [address], [place] 3)  [address], [place] 613-692-8232(336) [phone](336) 757-526-3385  (203)[phone](336) (651)[phone]   [facility] Child Abuse Hot[facility] 340-058-2205(336) 972-203-6541 or [phone](336) [phone] (After Hours)

## 2014-01-06 ENCOUNTER — Encounter (HOSPITAL_BASED_OUTPATIENT_CLINIC_OR_DEPARTMENT_OTHER): Payer: Self-pay | Admitting: Emergency Medicine

## 2014-01-06 ENCOUNTER — Emergency Department (HOSPITAL_BASED_OUTPATIENT_CLINIC_OR_DEPARTMENT_OTHER): Payer: Self-pay

## 2014-01-06 ENCOUNTER — Emergency Department (HOSPITAL_BASED_OUTPATIENT_CLINIC_OR_DEPARTMENT_OTHER)
Admission: EM | Admit: 2014-01-06 | Discharge: 2014-01-06 | Disposition: A | Discharge status: Home / Self Care | Payer: Self-pay | Attending: Emergency Medicine | Admitting: Emergency Medicine

## 2014-01-06 DIAGNOSIS — R110 Nausea: Secondary | ICD-10-CM | POA: Insufficient documentation

## 2014-01-06 DIAGNOSIS — F17200 Nicotine dependence, unspecified, uncomplicated: Secondary | ICD-10-CM | POA: Insufficient documentation

## 2014-01-06 DIAGNOSIS — I773 Arterial fibromuscular dysplasia: Secondary | ICD-10-CM

## 2014-01-06 DIAGNOSIS — Z8673 Personal history of transient ischemic attack (TIA), and cerebral infarction without residual deficits: Secondary | ICD-10-CM | POA: Insufficient documentation

## 2014-01-06 DIAGNOSIS — J449 Chronic obstructive pulmonary disease, unspecified: Secondary | ICD-10-CM | POA: Insufficient documentation

## 2014-01-06 DIAGNOSIS — R079 Chest pain, unspecified: Secondary | ICD-10-CM | POA: Insufficient documentation

## 2014-01-06 DIAGNOSIS — Z95810 Presence of automatic (implantable) cardiac defibrillator: Secondary | ICD-10-CM | POA: Insufficient documentation

## 2014-01-06 DIAGNOSIS — G43909 Migraine, unspecified, not intractable, without status migrainosus: Secondary | ICD-10-CM | POA: Insufficient documentation

## 2014-01-06 DIAGNOSIS — J3489 Other specified disorders of nose and nasal sinuses: Secondary | ICD-10-CM | POA: Insufficient documentation

## 2014-01-06 DIAGNOSIS — J4489 Other specified chronic obstructive pulmonary disease: Secondary | ICD-10-CM | POA: Insufficient documentation

## 2014-01-06 DIAGNOSIS — F411 Generalized anxiety disorder: Secondary | ICD-10-CM | POA: Insufficient documentation

## 2014-01-06 DIAGNOSIS — I4891 Unspecified atrial fibrillation: Secondary | ICD-10-CM | POA: Insufficient documentation

## 2014-01-06 DIAGNOSIS — M1710 Unilateral primary osteoarthritis, unspecified knee: Secondary | ICD-10-CM | POA: Insufficient documentation

## 2014-01-06 DIAGNOSIS — F329 Major depressive disorder, single episode, unspecified: Secondary | ICD-10-CM | POA: Insufficient documentation

## 2014-01-06 DIAGNOSIS — I10 Essential (primary) hypertension: Secondary | ICD-10-CM | POA: Insufficient documentation

## 2014-01-06 DIAGNOSIS — I2510 Atherosclerotic heart disease of native coronary artery without angina pectoris: Secondary | ICD-10-CM | POA: Insufficient documentation

## 2014-01-06 DIAGNOSIS — F3289 Other specified depressive episodes: Secondary | ICD-10-CM | POA: Insufficient documentation

## 2014-01-06 DIAGNOSIS — J329 Chronic sinusitis, unspecified: Secondary | ICD-10-CM | POA: Insufficient documentation

## 2014-01-06 DIAGNOSIS — R42 Dizziness and giddiness: Secondary | ICD-10-CM | POA: Insufficient documentation

## 2014-01-06 DIAGNOSIS — E785 Hyperlipidemia, unspecified: Secondary | ICD-10-CM | POA: Insufficient documentation

## 2014-01-06 DIAGNOSIS — IMO0002 Reserved for concepts with insufficient information to code with codable children: Secondary | ICD-10-CM

## 2014-01-06 DIAGNOSIS — G40909 Epilepsy, unspecified, not intractable, without status epilepticus: Secondary | ICD-10-CM | POA: Insufficient documentation

## 2014-01-06 DIAGNOSIS — Z8674 Personal history of sudden cardiac arrest: Secondary | ICD-10-CM | POA: Insufficient documentation

## 2014-01-06 DIAGNOSIS — Z792 Long term (current) use of antibiotics: Secondary | ICD-10-CM | POA: Insufficient documentation

## 2014-01-06 DIAGNOSIS — I7789 Other specified disorders of arteries and arterioles: Secondary | ICD-10-CM | POA: Insufficient documentation

## 2014-01-06 LAB — COMPREHENSIVE METABOLIC PANEL
ALBUMIN: 4.1 g/dL (ref 3.5–5.2)
ALK PHOS: 83 U/L (ref 39–117)
ALT: 21 U/L (ref 0–35)
AST: 24 U/L (ref 0–37)
BUN: 23 mg/dL (ref 6–23)
CO2: 27 mEq/L (ref 19–32)
CREATININE: 1.2 mg/dL — AB (ref 0.50–1.10)
Calcium: 10.5 mg/dL (ref 8.4–10.5)
Chloride: 98 mEq/L (ref 96–112)
GFR calc Af Amer: 54 mL/min — ABNORMAL LOW (ref >90)
GFR calc non Af Amer: 47 mL/min — ABNORMAL LOW (ref >90)
GLUCOSE: 93 mg/dL (ref 70–99)
Potassium: 4.5 mEq/L (ref 3.7–5.3)
Sodium: 138 mEq/L (ref 137–147)
TOTAL PROTEIN: 7.5 g/dL (ref 6.0–8.3)
Total Bilirubin: 0.2 mg/dL — ABNORMAL LOW (ref 0.3–1.2)

## 2014-01-06 LAB — TROPONIN I: Troponin I: <0.3 ng/mL (ref <0.30)

## 2014-01-06 LAB — CBC WITH DIFFERENTIAL/PLATELET
BASOS PCT: 1 % (ref 0–1)
Basophils Absolute: 0.1 10*3/uL (ref 0.0–0.1)
EOS ABS: 0.3 10*3/uL (ref 0.0–0.7)
EOS PCT: 4 % (ref 0–5)
HCT: 41.7 % (ref 36.0–46.0)
HEMOGLOBIN: 14.4 g/dL (ref 12.0–15.0)
LYMPHS ABS: 2 10*3/uL (ref 0.7–4.0)
Lymphocytes Relative: 22 % (ref 12–46)
MCH: 32.6 pg (ref 26.0–34.0)
MCHC: 34.5 g/dL (ref 30.0–36.0)
MCV: 94.3 fL (ref 78.0–100.0)
MONO ABS: 0.8 10*3/uL (ref 0.1–1.0)
MONOS PCT: 8 % (ref 3–12)
Neutro Abs: 5.9 10*3/uL (ref 1.7–7.7)
Neutrophils Relative %: 65 % (ref 43–77)
Platelets: 273 10*3/uL (ref 150–400)
RBC: 4.42 MIL/uL (ref 3.87–5.11)
RDW: 12.5 % (ref 11.5–15.5)
WBC: 9.1 10*3/uL (ref 4.0–10.5)

## 2014-01-06 LAB — PROTIME-INR
INR: 1.03 (ref 0.00–1.49)
Prothrombin Time: 13.3 seconds (ref 11.6–15.2)

## 2014-01-06 LAB — PRO B NATRIURETIC PEPTIDE: PRO B NATRI PEPTIDE: 176.7 pg/mL — AB (ref 0–125)

## 2014-01-06 MED ORDER — DIAZEPAM 2 MG PO TABS: 2.0000 mg | ORAL_TABLET | Freq: Three times a day (TID) | ORAL | Status: DC | PRN

## 2014-01-06 MED ORDER — IOHEXOL 350 MG/ML SOLN
100.0000 mL | Freq: Once | INTRAVENOUS | Status: AC | PRN
  Administered 2014-01-06: 100 mL via INTRAVENOUS

## 2014-01-06 MED ORDER — LORAZEPAM 1 MG PO TABS
1.0000 mg | ORAL_TABLET | Freq: Once | ORAL | Status: AC
  Administered 2014-01-06: 1 mg via ORAL
  Filled 2014-01-06: qty 1

## 2014-01-06 MED ORDER — AMOXICILLIN 500 MG PO CAPS: 500.0000 mg | ORAL_CAPSULE | Freq: Three times a day (TID) | ORAL | Status: DC

## 2014-01-06 NOTE — ED Notes (Signed)
Pt cont attempting to call her friend for a ride home.

## 2014-01-06 NOTE — Discharge Instructions (Signed)
Sinusitis Followup with a neurologist regarding the dilated blood vessels in your head and neck. Take antibiotics as prescribed for a sinus infection. Continue to take your blood thinner. Return to the ED if you develop new or worsening symptoms. Sinusitis is redness, soreness, and swelling (inflammation) of the paranasal sinuses. Paranasal sinuses are air pockets within the bones of your face (beneath the eyes, the middle of the forehead, or above the eyes). In healthy paranasal sinuses, mucus is able to drain out, and air is able to circulate through them by way of your nose. However, when your paranasal sinuses are inflamed, mucus and air can become trapped. This can allow bacteria and other germs to grow and cause infection. Sinusitis can develop quickly and last only a short time (acute) or continue over a long period (chronic). Sinusitis that lasts for more than 12 weeks is considered chronic.  CAUSES  Causes of sinusitis include:  Allergies.  Structural abnormalities, such as displacement of the cartilage that separates your nostrils (deviated septum), which can decrease the air flow through your nose and sinuses and affect sinus drainage.  Functional abnormalities, such as when the small hairs (cilia) that line your sinuses and help remove mucus do not work properly or are not present. SYMPTOMS  Symptoms of acute and chronic sinusitis are the same. The primary symptoms are pain and pressure around the affected sinuses. Other symptoms include:  Upper toothache.  Earache.  Headache.  Bad breath.  Decreased sense of smell and taste.  A cough, which worsens when you are lying flat.  Fatigue.  Fever.  Thick drainage from your nose, which often is green and may contain pus (purulent).  Swelling and warmth over the affected sinuses. DIAGNOSIS  Your caregiver will perform a physical exam. During the exam, your caregiver may:  Look in your nose for signs of abnormal growths in  your nostrils (nasal polyps).  Tap over the affected sinus to check for signs of infection.  View the inside of your sinuses (endoscopy) with a special imaging device with a light attached (endoscope), which is inserted into your sinuses. If your caregiver suspects that you have chronic sinusitis, one or more of the following tests may be recommended:  Allergy tests.  Nasal culture A sample of mucus is taken from your nose and sent to a lab and screened for bacteria.  Nasal cytology A sample of mucus is taken from your nose and examined by your caregiver to determine if your sinusitis is related to an allergy. TREATMENT  Most cases of acute sinusitis are related to a viral infection and will resolve on their own within 10 days. Sometimes medicines are prescribed to help relieve symptoms (pain medicine, decongestants, nasal steroid sprays, or saline sprays).  However, for sinusitis related to a bacterial infection, your caregiver will prescribe antibiotic medicines. These are medicines that will help kill the bacteria causing the infection.  Rarely, sinusitis is caused by a fungal infection. In theses cases, your caregiver will prescribe antifungal medicine. For some cases of chronic sinusitis, surgery is needed. Generally, these are cases in which sinusitis recurs more than 3 times per year, despite other treatments. HOME CARE INSTRUCTIONS   Drink plenty of water. Water helps thin the mucus so your sinuses can drain more easily.  Use a humidifier.  Inhale steam 3 to 4 times a day (for example, sit in the bathroom with the shower running).  Apply a warm, moist washcloth to your face 3 to 4 times a  day, or as directed by your caregiver.  Use saline nasal sprays to help moisten and clean your sinuses.  Take over-the-counter or prescription medicines for pain, discomfort, or fever only as directed by your caregiver. SEEK IMMEDIATE MEDICAL CARE IF:  You have increasing pain or severe  headaches.  You have nausea, vomiting, or drowsiness.  You have swelling around your face.  You have vision problems.  You have a stiff neck.  You have difficulty breathing. MAKE SURE YOU:   Understand these instructions.  Will watch your condition.  Will get help right away if you are not doing well or get worse. Document Released: 09/08/2005 Document Revised: 12/01/2011 Document Reviewed: 09/23/2011 [facility] Patient Information 2014 [place], [place].

## 2014-01-06 NOTE — ED Notes (Signed)
Per EMS, pt c/o HA, N, nasal discharge, denies visual disturbances, stroke scale neg. [facility]

## 2014-01-06 NOTE — ED Provider Notes (Signed)
CSN: [id number]     Arrival date & time 01/06/14  1028 History  This chart was scribed for Glynn OctaveStephen Marine Lezotte, MD by Shari HeritageAisha Amuda, ED Scribe. The patient was seen in room MH11/MH11. Patient's care was started at 10:58 AM.    Chief Complaint  Patient presents with  . Headache  . Nasal Congestion    The history is provided by the patient. No language interpreter was used.    HPI Comments: [name] is a 63 y.o. female who presents to the Emergency Department complaining of gradually worsening, constant, frontal headache onset 5 hours ago. She has taken Tylenol with mild relief. She states that she felt well when she went to bed. She woke up this morning at about 5 AM and later developed a headache that progressively worsened over the course of the morning prompting her to come to the ED for evaluation. There is associated congestion, rhinorrhea, postnasal drip, nausea. Patient also reports some mild dizziness with position changes. She denies fever, vomiting, dysphagia, sore throat, lightheadeness. She denies any recent changes in appetite. She denies any known sick contacts.  Patient further complains of mild to moderate left lateral posterior rib pain that began last night. She denies any recent injury or trauma to the area. Patient has had increased anxiety for the past couple of days and is taking Klonopin and Valium.   Patient has a history of cardiac arrest in 2001 during an ED visit and now has a defibrillator. She also has a medical history of anxiety, COPD, HTN (on metoprolol), atrial fibrillation (on Xarelto), coronary artery disease, hyperlipidemia (taking simvastatin), CVA. She denies history of MI or cardiac stents.  Cardiologist - Kirke CorinArida  Past Medical History  Diagnosis Date  . Coronary artery disease     non obstructive  . Stroke   . Seizures   . Anxiety   . Depression   . Migraine   . Fibromuscular dysplasia   . Tobacco abuse   . COPD (chronic obstructive pulmonary  disease)   . Arthritis of knee     Bilateral  . Tobacco abuse   . Migraine headache   . Anxiety disorder   . Depression   . Seizure disorder   . Cerebrovascular disease   . CAD (coronary artery disease)   . Automatic implantable cardiac defibrillator in situ     [employer] ICD  . HTN (hypertension)   . HLD (hyperlipidemia)   . Ventricular fibrillation   . Long QT syndrome   . Cardiac arrest   . Atrial fibrillation     a) 2D echo 09/11/12: LVEF 55-60%, mild LVH, mld LA dilatation   Past Surgical History  Procedure Laterality Date  . Pacemaker insertion  05/01/2003    ICD, implantation of a Guidant single chamber defibrillator, Doylene CanningGregg W. Ladona Ridgelaylor MD  . Cholecystectomy    . Tubal ligation    . Breast lumpectomy     Family History  Problem Relation Age of Onset  . Dementia Mother   . Stroke Mother     Multiple  . Aneurysm Sister     Brain  . Uterine cancer Other     Grandmother  . Colon cancer Sister    History  Substance Use Topics  . Smoking status: Current Some Day Smoker -- 1.00 packs/day for 40 years    Types: Cigarettes  . Smokeless tobacco: Never Used     Comment: almost a pack a week  . Alcohol Use: No   OB History  Grav Para Term Preterm Abortions TAB SAB Ect Mult Living                 Review of Systems A complete 10 system review of systems was obtained and all systems are negative except as noted in the HPI and PMH.   Allergies  Azithromycin; Ibuprofen; Codeine; and Demerol  Home Medications   Prior to Admission medications   Medication Sig Start Date End Date Taking? Authorizing Provider  acetaminophen (TYLENOL) 500 MG tablet Take 1,000 mg by mouth every 6 (six) hours as needed for pain.    Historical Provider, MD  albuterol (PROVENTIL HFA;VENTOLIN HFA) 108 (90 BASE) MCG/ACT inhaler Inhale 2 puffs into the lungs every 6 (six) hours as needed for wheezing.    Historical Provider, MD  amoxicillin (AMOXIL) 500 MG capsule Take 500 mg by mouth  2 (two) times daily.    Historical Provider, MD  clonazePAM (KLONOPIN) 1 MG tablet 1 mg. As diretced 10/25/13 11/24/13  Historical Provider, MD  diazepam (VALIUM) 5 MG tablet Take 1 tablet (5 mg total) by mouth every 8 (eight) hours as needed for anxiety. 12/12/13   Carlisle Beers Molpus, MD  diphenhydrAMINE (BENADRYL) 25 MG tablet Take 25 mg by mouth every 6 (six) hours as needed for allergies.     Historical Provider, MD  diphenhydramine-acetaminophen (TYLENOL PM) 25-500 MG TABS Take 1 tablet by mouth at bedtime as needed (for sleep).     Historical Provider, MD  loperamide (IMODIUM A-D) 2 MG tablet Take 4 mg by mouth 3 (three) times daily as needed for diarrhea or loose stools.    Historical Provider, MD  metoprolol tartrate (LOPRESSOR) 25 MG tablet Take 1 tablet (25 mg total) by mouth 2 (two) times daily. 11/29/13   Iran Ouch, MD  mometasone (NASONEX) 50 MCG/ACT nasal spray Place 2 sprays into the nose daily.    Historical Provider, MD  Rivaroxaban (XARELTO) 15 MG TABS tablet Take 1 tablet (15 mg total) by mouth daily with supper. 11/01/13   Iran Ouch, MD  sertraline (ZOLOFT) 100 MG tablet Take 150 mg by mouth at bedtime.    Historical Provider, MD  simvastatin (ZOCOR) 20 MG tablet Take 1 tablet (20 mg total) by mouth at bedtime. 11/29/13   Iran Ouch, MD   Triage Vitals: BP 143/85  Pulse 66  Temp(Src) 98 F (36.7 C) (Oral)  Resp 18  SpO2 98% Physical Exam  Constitutional: She is oriented to person, place, and time. She appears well-developed and well-nourished. No distress.  HENT:  Head: Normocephalic and atraumatic.  Right Ear: Tympanic membrane, external ear and ear canal normal.  Left Ear: Tympanic membrane, external ear and ear canal normal.  Mouth/Throat: Oropharynx is clear and moist.  Nasal congestion.   Eyes: Conjunctivae and EOM are normal. Pupils are equal, round, and reactive to light.  Neck: Normal range of motion. Neck supple.  No meningismus.  Cardiovascular:  Normal rate, regular rhythm and normal heart sounds.  Exam reveals no gallop and no friction rub.   No murmur heard. Pulmonary/Chest: Effort normal and breath sounds normal. No respiratory distress. She has no wheezes. She has no rales.  Defibrillator left upper chest.  Abdominal: Soft. There is no tenderness. There is no rebound and no guarding.  Musculoskeletal: Normal range of motion. She exhibits tenderness. She exhibits no edema.  No lower extremity edema.  Left lateral rib tenderness. No bruising. No crepitus.  Neurological: She is alert and oriented to person,  place, and time. No cranial nerve deficit. She exhibits normal muscle tone. Coordination normal.  No ataxia on finger to nose bilaterally. No pronator drift. 5/5 strength throughout. CN 2-12 intact. Equal grip strength. Sensation intact. Gait is normal. Romberg negative. Test of skew negative. Head impulse testing negative. No nystagmus.     Skin: Skin is warm and dry.  Psychiatric: She has a normal mood and affect. Her behavior is normal.    ED Course  Procedures (including critical care time) DIAGNOSTIC STUDIES: Oxygen Saturation is 98% on room air, normal by my interpretation.    COORDINATION OF CARE: 11:09 AM- Will order head CT, CXR; and CBC with diff, CMP, troponin, BNP. Patient informed of current plan for treatment and evaluation and agrees with plan at this time.   12:08 PM - Patient is feeling better, but incidentally noted that her left pupil is larger than her right (7mm vs 3 mm). Patient says this happens occasionally when she is sick and she has noticed it before. No headache or vision change. Ct head negative for hemorrhage. Pupils have improved during my time in the room. Will check CT angiogram.   12:42 PM - Pupils appear equal. Patient continues to improve after medicines. Ordered lorazepam.    Labs Review Labs Reviewed  COMPREHENSIVE METABOLIC PANEL - Abnormal; Notable for the following:     Creatinine, Ser 1.20 (*)    Total Bilirubin 0.2 (*)    GFR calc non Af Amer 47 (*)    GFR calc Af Amer 54 (*)    All other components within normal limits  PRO B NATRIURETIC PEPTIDE - Abnormal; Notable for the following:    Pro B Natriuretic peptide (BNP) 176.7 (*)    All other components within normal limits  CBC WITH DIFFERENTIAL  TROPONIN I  PROTIME-INR    Imaging Review Dg Chest 2 View  01/06/2014   CLINICAL DATA:  Headache and nasal congestion  EXAM: CHEST  2 VIEW  COMPARISON:  DG CHEST 2 VIEW dated 07/28/2013  FINDINGS: The lungs are adequately inflated. There is no focal infiltrate. The interstitial markings are mildly increased in the perihilar regions bilaterally. The cardiopericardial silhouette is normal in size. There is tortuosity of the descending thoracic aorta. The pulmonary vascularity is not engorged. The permanent pacemaker defibrillator is unchanged in appearance. There is no pleural effusion or pneumothorax. The observed portions of the bony thorax exhibit no acute abnormalities.  IMPRESSION: There is no focal pneumonia. Mild perihilar subsegmental atelectasis may reflect acute bronchitis. There is no overt evidence of CHF.   Electronically Signed   By: David  Swazil[place]   On: 01/06/2014 11:45   Ct Head Wo Contrast  01/06/2014   CLINICAL DATA:  Headache and nasal drainage  EXAM: CT HEAD WITHOUT CONTRAST  TECHNIQUE: Contiguous axial images were obtained from the base of the skull through the vertex without intravenous contrast.  COMPARISON:  CT HEAD W/O CM dated 07/28/2013  FINDINGS: The ventricles are normal in size and position. There is no intracranial hemorrhage or intracranial mass effect. There is stable hypodensity in the deep white matter of both cerebral hemispheres. This is somewhat more prominent in the parietal deep white matter on the right than on the left. This is not a new finding. There is no evidence of an evolving ischemic infarction. There are no abnormal  intracranial calcifications. The cerebellum and brainstem are normal in density.  At bone window settings there is mucoperiosteal thickening within several ethmoid sinus cells bilaterally.  The sphenoid sinuses are clear. There is mucoperiosteal thickening within the frontal sinuses. The maxillary sinuses are not included in the field of view. The mastoid sinuses are clear. There is no lytic or blastic bony lesion.  IMPRESSION: 1. There is no evidence of an acute ischemic or hemorrhagic event within the brain. There are stable findings consistent with chronic small vessel ischemia. 2. There are findings consistent with sinusitis of the frontal and ethmoid sinuses. The maxillary sinuses cannot be assessed.   Electronically Signed   By: David  [place]   On: 01/06/2014 11:37     EKG Interpretation   Date/Time:  Friday January 06 2014 11:50:19 EDT Ventricular Rate:  61 PR Interval:  260 QRS Duration: 82 QT Interval:  414 QTC Calculation: 416 R Axis:   26 Text Interpretation:  Sinus rhythm with 1st degree A-V block Nonspecific T  wave abnormality Abnormal ECG No significant change was found Confirmed by  Manus Gunning  MD, Tekesha Almgren [id number]) on 01/06/2014 12:15:27 PM      MDM   Final diagnoses:  Sinusitis  Fibromuscular dysplasia   Gradual onset diffuse headache with nasal congestion onset this morning. No thunderclap onset. No chest pain or shortness of breath. History of cardiac arrest secondary to prolonged QT syndrome with AICD in place. No recent shocks. History of atrial fibrillation on xarelto.  Nonfocal neuro exam. No meningismus. CT head checked given anticoagulant use. No hemorrhage. Evidence of sinusitis in the frontal sinuses. Chest x-ray negative for pneumonia  On recheck patient noted to have aniscoria she states she's had in the past. Denies any headache or vision change  Radiology results discussed with Dr. Margo Aye radiology. Patient has dilation diffusely of her carotid and vertebral  arteries suspicious for connective tissue disorder. Patient does have a history of fibromuscular dysplasia which is listed in her chart and was seen on MRI in 2004. 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  She has no focal deficits today she has no ataxia. Nothing to suggest acute cerebellar infarct. Radiology results discussed with Dr. Amada Jupiter of neurology who agrees that likely aniscoria is transient benign finding. No evidence of intracranial aneurysm. Patient already on xarelto nothing additional to add to treat FMD were occluded vertebral artery. He recommends outpatient neurology follow up.  aniscoria also discussed with ophthamologist Dr. Allyne Gee who agrees no additional workup needed today. He states pupil size could be unequal from transient hypoglycemia or infection.  Patient is tolerating PO and ambulatory. Will treat for sinusitis.  She is informed of imaging finds and need for followup with neurology. Return precautions discussed.  I personally performed the services described in this documentation, which was scribed in my presence. The recorded information has been reviewed and is accurate.    Glynn Octave, MD 01/06/14 [phone]

## 2014-01-06 NOTE — ED Notes (Signed)
Patient transported to CT 

## 2014-01-06 NOTE — ED Notes (Signed)
md speaking with pt, discussing d/c instructions.

## 2014-01-06 NOTE — ED Notes (Signed)
Pt given 120 oz. Water with ativan, drinks approx half.

## 2014-01-06 NOTE — ED Notes (Signed)
Patient transported to X-ray 

## 2014-01-06 NOTE — ED Notes (Signed)
Pt assisted to call her son to come pick her up.

## 2014-01-06 NOTE — ED Notes (Addendum)
Pt c/o headache "throbbing" and nasal discharge since waking up this morning. Pt reports h/o similar h/a in past. Took tylenol and benadryl at 6am and reports some relief. Pt sts her anxiety med was recently changed and is having "more panic attacks".

## 2014-01-09 ENCOUNTER — Encounter (HOSPITAL_BASED_OUTPATIENT_CLINIC_OR_DEPARTMENT_OTHER): Payer: Self-pay | Admitting: Emergency Medicine

## 2014-01-09 ENCOUNTER — Emergency Department (HOSPITAL_BASED_OUTPATIENT_CLINIC_OR_DEPARTMENT_OTHER)
Admission: EM | Admit: 2014-01-09 | Discharge: 2014-01-09 | Disposition: A | Discharge status: Home / Self Care | Payer: Self-pay | Attending: Emergency Medicine | Admitting: Emergency Medicine

## 2014-01-09 ENCOUNTER — Emergency Department (HOSPITAL_BASED_OUTPATIENT_CLINIC_OR_DEPARTMENT_OTHER): Payer: Self-pay

## 2014-01-09 DIAGNOSIS — R110 Nausea: Secondary | ICD-10-CM

## 2014-01-09 DIAGNOSIS — Z79899 Other long term (current) drug therapy: Secondary | ICD-10-CM | POA: Insufficient documentation

## 2014-01-09 DIAGNOSIS — R112 Nausea with vomiting, unspecified: Secondary | ICD-10-CM | POA: Insufficient documentation

## 2014-01-09 DIAGNOSIS — M1710 Unilateral primary osteoarthritis, unspecified knee: Secondary | ICD-10-CM | POA: Insufficient documentation

## 2014-01-09 DIAGNOSIS — I10 Essential (primary) hypertension: Secondary | ICD-10-CM | POA: Insufficient documentation

## 2014-01-09 DIAGNOSIS — Z95810 Presence of automatic (implantable) cardiac defibrillator: Secondary | ICD-10-CM | POA: Insufficient documentation

## 2014-01-09 DIAGNOSIS — Z8673 Personal history of transient ischemic attack (TIA), and cerebral infarction without residual deficits: Secondary | ICD-10-CM | POA: Insufficient documentation

## 2014-01-09 DIAGNOSIS — E785 Hyperlipidemia, unspecified: Secondary | ICD-10-CM | POA: Insufficient documentation

## 2014-01-09 DIAGNOSIS — F329 Major depressive disorder, single episode, unspecified: Secondary | ICD-10-CM | POA: Insufficient documentation

## 2014-01-09 DIAGNOSIS — I4891 Unspecified atrial fibrillation: Secondary | ICD-10-CM | POA: Insufficient documentation

## 2014-01-09 DIAGNOSIS — F17200 Nicotine dependence, unspecified, uncomplicated: Secondary | ICD-10-CM | POA: Insufficient documentation

## 2014-01-09 DIAGNOSIS — R197 Diarrhea, unspecified: Secondary | ICD-10-CM | POA: Insufficient documentation

## 2014-01-09 DIAGNOSIS — Z7901 Long term (current) use of anticoagulants: Secondary | ICD-10-CM | POA: Insufficient documentation

## 2014-01-09 DIAGNOSIS — R0602 Shortness of breath: Secondary | ICD-10-CM | POA: Insufficient documentation

## 2014-01-09 DIAGNOSIS — G43909 Migraine, unspecified, not intractable, without status migrainosus: Secondary | ICD-10-CM | POA: Insufficient documentation

## 2014-01-09 DIAGNOSIS — F419 Anxiety disorder, unspecified: Secondary | ICD-10-CM

## 2014-01-09 DIAGNOSIS — J449 Chronic obstructive pulmonary disease, unspecified: Secondary | ICD-10-CM | POA: Insufficient documentation

## 2014-01-09 DIAGNOSIS — Z792 Long term (current) use of antibiotics: Secondary | ICD-10-CM | POA: Insufficient documentation

## 2014-01-09 DIAGNOSIS — IMO0002 Reserved for concepts with insufficient information to code with codable children: Secondary | ICD-10-CM

## 2014-01-09 DIAGNOSIS — I2510 Atherosclerotic heart disease of native coronary artery without angina pectoris: Secondary | ICD-10-CM | POA: Insufficient documentation

## 2014-01-09 DIAGNOSIS — F3289 Other specified depressive episodes: Secondary | ICD-10-CM | POA: Insufficient documentation

## 2014-01-09 DIAGNOSIS — G40909 Epilepsy, unspecified, not intractable, without status epilepticus: Secondary | ICD-10-CM | POA: Insufficient documentation

## 2014-01-09 DIAGNOSIS — F411 Generalized anxiety disorder: Secondary | ICD-10-CM | POA: Insufficient documentation

## 2014-01-09 DIAGNOSIS — J4489 Other specified chronic obstructive pulmonary disease: Secondary | ICD-10-CM | POA: Insufficient documentation

## 2014-01-09 LAB — COMPREHENSIVE METABOLIC PANEL
ALK PHOS: 83 U/L (ref 39–117)
ALT: 22 U/L (ref 0–35)
AST: 24 U/L (ref 0–37)
Albumin: 4.1 g/dL (ref 3.5–5.2)
BUN: 20 mg/dL (ref 6–23)
CALCIUM: 10.2 mg/dL (ref 8.4–10.5)
CO2: 26 mEq/L (ref 19–32)
Chloride: 100 mEq/L (ref 96–112)
Creatinine, Ser: 1.3 mg/dL — ABNORMAL HIGH (ref 0.50–1.10)
GFR calc Af Amer: 49 mL/min — ABNORMAL LOW (ref >90)
GFR calc non Af Amer: 42 mL/min — ABNORMAL LOW (ref >90)
Glucose, Bld: 104 mg/dL — ABNORMAL HIGH (ref 70–99)
POTASSIUM: 4.2 meq/L (ref 3.7–5.3)
SODIUM: 138 meq/L (ref 137–147)
Total Bilirubin: 0.3 mg/dL (ref 0.3–1.2)
Total Protein: 7.5 g/dL (ref 6.0–8.3)

## 2014-01-09 LAB — URINALYSIS, ROUTINE W REFLEX MICROSCOPIC
Bilirubin Urine: NEGATIVE
GLUCOSE, UA: NEGATIVE mg/dL
KETONES UR: NEGATIVE mg/dL
Nitrite: NEGATIVE
Protein, ur: 30 mg/dL — AB
Specific Gravity, Urine: 1.025 (ref 1.005–1.030)
Urobilinogen, UA: 0.2 mg/dL (ref 0.0–1.0)
pH: 6 (ref 5.0–8.0)

## 2014-01-09 LAB — CBC WITH DIFFERENTIAL/PLATELET
BASOS PCT: 1 % (ref 0–1)
Basophils Absolute: 0.1 10*3/uL (ref 0.0–0.1)
Eosinophils Absolute: 0.2 10*3/uL (ref 0.0–0.7)
Eosinophils Relative: 2 % (ref 0–5)
HCT: 41.9 % (ref 36.0–46.0)
Hemoglobin: 14.4 g/dL (ref 12.0–15.0)
Lymphocytes Relative: 24 % (ref 12–46)
Lymphs Abs: 2.1 10*3/uL (ref 0.7–4.0)
MCH: 32.4 pg (ref 26.0–34.0)
MCHC: 34.4 g/dL (ref 30.0–36.0)
MCV: 94.2 fL (ref 78.0–100.0)
MONOS PCT: 8 % (ref 3–12)
Monocytes Absolute: 0.7 10*3/uL (ref 0.1–1.0)
Neutro Abs: 5.7 10*3/uL (ref 1.7–7.7)
Neutrophils Relative %: 65 % (ref 43–77)
PLATELETS: 263 10*3/uL (ref 150–400)
RBC: 4.45 MIL/uL (ref 3.87–5.11)
RDW: 12.5 % (ref 11.5–15.5)
WBC: 8.8 10*3/uL (ref 4.0–10.5)

## 2014-01-09 LAB — URINE MICROSCOPIC-ADD ON

## 2014-01-09 LAB — TROPONIN I: Troponin I: <0.3 ng/mL (ref <0.30)

## 2014-01-09 MED ORDER — SODIUM CHLORIDE 0.9 % IV BOLUS (SEPSIS)
500.0000 mL | Freq: Once | INTRAVENOUS | Status: AC
  Administered 2014-01-09: 500 mL via INTRAVENOUS

## 2014-01-09 MED ORDER — LORAZEPAM 1 MG PO TABS
1.0000 mg | ORAL_TABLET | Freq: Once | ORAL | Status: AC
  Administered 2014-01-09: 1 mg via ORAL
  Filled 2014-01-09: qty 1

## 2014-01-09 NOTE — ED Notes (Signed)
MD at bedside. 

## 2014-01-09 NOTE — ED Provider Notes (Signed)
CSN: [id number]     Arrival date & time 01/09/14  1743 History  This chart was scribed for Shon Batonourtney F Adolf Ormiston, MD by Smiley HousemanFallon Davis, ED Scribe. The patient was seen in room MH08/MH08. Patient's care was started at 6:05 PM.  Chief Complaint  Patient presents with  . Anxiety   The history is provided by the patient and the EMS personnel.   HPI Comments: [name] is a 63 y.o. female who presents to the Emergency Department complaining of an anxiety attack.  Pt states for the past three weeks she has been laying in her bed in the fetal position.  She states she feels agitated and doesn't want to get up.  Pt states her cousin passed away recently and thinks her anxiety has worsened.  Pt reports she is not at a functioning level.  Pt reports she has had anxiety issues since the age of 63.  She states she was taking diazepam, but now she takes Klonopin.  She states her PCP is supposed to monitor it, but she doesn't have any finances to visit him.  .  Pt denies HI or SI.  Pt states she is experiencing nausea, diarrhea, SOB, decreased appetite, and insomnia.  Pt states she was seen here on Friday and diagnosed with a sinus infection.  She states she couldn't get her prescription filled and lost her resource guide.  Pt reports she has h/o chronic afib and has a defibrillator.    Past Medical History  Diagnosis Date  . Coronary artery disease     non obstructive  . Stroke   . Seizures   . Anxiety   . Depression   . Migraine   . Fibromuscular dysplasia   . Tobacco abuse   . COPD (chronic obstructive pulmonary disease)   . Arthritis of knee     Bilateral  . Tobacco abuse   . Migraine headache   . Anxiety disorder   . Depression   . Seizure disorder   . Cerebrovascular disease   . CAD (coronary artery disease)   . Automatic implantable cardiac defibrillator in situ     [employer] ICD  . HTN (hypertension)   . HLD (hyperlipidemia)   . Ventricular fibrillation   . Long QT syndrome    . Cardiac arrest   . Atrial fibrillation     a) 2D echo 09/11/12: LVEF 55-60%, mild LVH, mld LA dilatation   Past Surgical History  Procedure Laterality Date  . Pacemaker insertion  05/01/2003    ICD, implantation of a Guidant single chamber defibrillator, Doylene CanningGregg W. Ladona Ridgelaylor MD  . Cholecystectomy    . Tubal ligation    . Breast lumpectomy     Family History  Problem Relation Age of Onset  . Dementia Mother   . Stroke Mother     Multiple  . Aneurysm Sister     Brain  . Uterine cancer Other     Grandmother  . Colon cancer Sister    History  Substance Use Topics  . Smoking status: Current Some Day Smoker -- 1.00 packs/day for 40 years    Types: Cigarettes  . Smokeless tobacco: Never Used     Comment: almost a pack a week  . Alcohol Use: No   OB History   Grav Para Term Preterm Abortions TAB SAB Ect Mult Living                 Review of Systems  Constitutional: Positive for fatigue. Negative for  fever.  Respiratory: Positive for shortness of breath. Negative for cough and chest tightness.   Cardiovascular: Negative for chest pain.  Gastrointestinal: Positive for nausea and vomiting. Negative for abdominal pain.  Genitourinary: Negative for dysuria.  Musculoskeletal: Negative for back pain.  Skin: Negative for wound.  Neurological: Negative for headaches.  Psychiatric/Behavioral: Positive for sleep disturbance. Negative for suicidal ideas, hallucinations and confusion. The patient is nervous/anxious.   All other systems reviewed and are negative.     Allergies  Azithromycin; Ibuprofen; Codeine; and Demerol  Home Medications   Prior to Admission medications   Medication Sig Start Date End Date Taking? Authorizing Provider  acetaminophen (TYLENOL) 500 MG tablet Take 1,000 mg by mouth every 6 (six) hours as needed for pain.    Historical Provider, MD  albuterol (PROVENTIL HFA;VENTOLIN HFA) 108 (90 BASE) MCG/ACT inhaler Inhale 2 puffs into the lungs every 6 (six)  hours as needed for wheezing.    Historical Provider, MD  amoxicillin (AMOXIL) 500 MG capsule Take 500 mg by mouth 2 (two) times daily.    Historical Provider, MD  amoxicillin (AMOXIL) 500 MG capsule Take 1 capsule (500 mg total) by mouth 3 (three) times daily. 01/06/14   Glynn Octave, MD  clonazePAM (KLONOPIN) 1 MG tablet 1 mg. As diretced 10/25/13 11/24/13  Historical Provider, MD  diazepam (VALIUM) 2 MG tablet Take 1 tablet (2 mg total) by mouth every 8 (eight) hours as needed for anxiety. 01/06/14   Glynn Octave, MD  diphenhydrAMINE (BENADRYL) 25 MG tablet Take 25 mg by mouth every 6 (six) hours as needed for allergies.     Historical Provider, MD  diphenhydramine-acetaminophen (TYLENOL PM) 25-500 MG TABS Take 1 tablet by mouth at bedtime as needed (for sleep).     Historical Provider, MD  loperamide (IMODIUM A-D) 2 MG tablet Take 4 mg by mouth 3 (three) times daily as needed for diarrhea or loose stools.    Historical Provider, MD  metoprolol tartrate (LOPRESSOR) 25 MG tablet Take 1 tablet (25 mg total) by mouth 2 (two) times daily. 11/29/13   Iran Ouch, MD  mometasone (NASONEX) 50 MCG/ACT nasal spray Place 2 sprays into the nose daily.    Historical Provider, MD  Rivaroxaban (XARELTO) 15 MG TABS tablet Take 1 tablet (15 mg total) by mouth daily with supper. 11/01/13   Iran Ouch, MD  sertraline (ZOLOFT) 100 MG tablet Take 150 mg by mouth at bedtime.    Historical Provider, MD  simvastatin (ZOCOR) 20 MG tablet Take 1 tablet (20 mg total) by mouth at bedtime. 11/29/13   Iran Ouch, MD   Triage Vitals: BP 142/85  Pulse 74  Temp(Src) 98.7 F (37.1 C) (Oral)  Resp 18  Ht 5\' 4"  (1.626 m)  Wt 165 lb (74.844 kg)  BMI 28.31 kg/m2  SpO2 95%  Physical Exam  Nursing note and vitals reviewed. Constitutional: She is oriented to person, place, and time.  Elderly, NAD  HENT:  Head: Normocephalic and atraumatic.  Mouth/Throat: Oropharynx is clear and moist.  Eyes: Pupils are  equal, round, and reactive to light.  Neck: Neck supple.  Cardiovascular: Normal rate, regular rhythm and normal heart sounds.   Pulmonary/Chest: Effort normal. No respiratory distress. She has no wheezes.  AICD palpated  Abdominal: Soft. Bowel sounds are normal. She exhibits no distension. There is no tenderness. There is no rebound and no guarding.  Musculoskeletal: She exhibits no edema.  Neurological: She is alert and oriented to person, place, and  time.  Skin: Skin is warm and dry.  Psychiatric:  Flat affect; depressed.    ED Course  Procedures (including critical care time) DIAGNOSTIC STUDIES: Oxygen Saturation is 95% on RA, adequate by my interpretation.    COORDINATION OF CARE: 6:15 PM-Will order IV fluids and Ativan.  Will order Troponin, CBC, Comprehensive metabolic panel, and UA.  Patient informed of current plan of treatment and evaluation and agrees with plan.    Results for orders placed during the hospital encounter of 01/09/14  TROPONIN I      Result Value Ref Range   Troponin I <0.30  <0.30 ng/mL  CBC WITH DIFFERENTIAL      Result Value Ref Range   WBC 8.8  4.0 - 10.5 K/uL   RBC 4.45  3.87 - 5.11 MIL/uL   Hemoglobin 14.4  12.0 - 15.0 g/dL   HCT 16.1  09.6 - 04.5 %   MCV 94.2  78.0 - 100.0 fL   MCH 32.4  26.0 - 34.0 pg   MCHC 34.4  30.0 - 36.0 g/dL   RDW 40.9  81.1 - 91.4 %   Platelets 263  150 - 400 K/uL   Neutrophils Relative % 65  43 - 77 %   Neutro Abs 5.7  1.7 - 7.7 K/uL   Lymphocytes Relative 24  12 - 46 %   Lymphs Abs 2.1  0.7 - 4.0 K/uL   Monocytes Relative 8  3 - 12 %   Monocytes Absolute 0.7  0.1 - 1.0 K/uL   Eosinophils Relative 2  0 - 5 %   Eosinophils Absolute 0.2  0.0 - 0.7 K/uL   Basophils Relative 1  0 - 1 %   Basophils Absolute 0.1  0.0 - 0.1 K/uL  COMPREHENSIVE METABOLIC PANEL      Result Value Ref Range   Sodium 138  137 - 147 mEq/L   Potassium 4.2  3.7 - 5.3 mEq/L   Chloride 100  96 - 112 mEq/L   CO2 26  19 - 32 mEq/L   Glucose,  Bld 104 (*) 70 - 99 mg/dL   BUN 20  6 - 23 mg/dL   Creatinine, Ser 7.82 (*) 0.50 - 1.10 mg/dL   Calcium 95.6  8.4 - 21.3 mg/dL   Total Protein 7.5  6.0 - 8.3 g/dL   Albumin 4.1  3.5 - 5.2 g/dL   AST 24  0 - 37 U/L   ALT 22  0 - 35 U/L   Alkaline Phosphatase 83  39 - 117 U/L   Total Bilirubin 0.3  0.3 - 1.2 mg/dL   GFR calc non Af Amer 42 (*) >90 mL/min   GFR calc Af Amer 49 (*) >90 mL/min  URINALYSIS, ROUTINE W REFLEX MICROSCOPIC      Result Value Ref Range   Color, Urine YELLOW  YELLOW   APPearance CLOUDY (*) CLEAR   Specific Gravity, Urine 1.025  1.005 - 1.030   pH 6.0  5.0 - 8.0   Glucose, UA NEGATIVE  NEGATIVE mg/dL   Hgb urine dipstick SMALL (*) NEGATIVE   Bilirubin Urine NEGATIVE  NEGATIVE   Ketones, ur NEGATIVE  NEGATIVE mg/dL   Protein, ur 30 (*) NEGATIVE mg/dL   Urobilinogen, UA 0.2  0.0 - 1.0 mg/dL   Nitrite NEGATIVE  NEGATIVE   Leukocytes, UA SMALL (*) NEGATIVE  URINE MICROSCOPIC-ADD ON      Result Value Ref Range   Squamous Epithelial / LPF FEW (*) RARE  WBC, UA 3-6  <3 WBC/hpf   RBC / HPF 3-6  <3 RBC/hpf   Bacteria, UA FEW (*) RARE    EKG independently reviewed by myself: Normal sinus rhythm with a rate of 71, no evidence of acute ST elevation, T-wave inversions noted in lead 3 and the anterior leads V1 through V4 MDM   Final diagnoses:  Anxiety  Nausea   She presents with increasing anxiety, symptoms of depression, and nausea diarrhea. Patient is nontoxic on exam and vital signs are reassuring.  She appears well-hydrated. Screening EKG was obtained to evaluate the patient's nausea given patient's cardiac history. No acute ischemia noted.  Basic labwork is reassuring. Patient was given fluids and KUB is no evidence of obstruction. Patient states that she lost her resource guide from her prior visit.  Given that she has no suicidal or homicidal ideation, patent feel patient is safe for outpatient followup.  After history, exam, and medical workup I feel the  patient has been appropriately medically screened and is safe for discharge home. Pertinent diagnoses were discussed with the patient. Patient was given return precautions.   I personally performed the services described in this documentation, which was scribed in my presence. The recorded information has been reviewed and is accurate.      Shon Batonourtney F Laisa Larrick, MD 01/12/14 1455

## 2014-01-09 NOTE — ED Notes (Signed)
Per EMS:  Pt reports anxiety, nausea, diarrhea and decreased appetite x several days.  States that today she 'just feels weak'.  HA x 4 days.  Seen in ED for same-dx with sinus infection.  Did not get rx for antibiotics filled.

## 2014-01-09 NOTE — Discharge Instructions (Signed)
Nausea, Adult Nausea is the feeling that you have an upset stomach or have to vomit. Nausea by itself is not likely a serious concern, but it may be an early sign of more serious medical problems. As nausea gets worse, it can lead to vomiting. If vomiting develops, there is the risk of dehydration.  CAUSES   Viral infections.  Food poisoning.  Medicines.  Pregnancy.  Motion sickness.  Migraine headaches.  Emotional distress.  Severe pain from any source.  Alcohol intoxication. HOME CARE INSTRUCTIONS  Get plenty of rest.  Ask your caregiver about specific rehydration instructions.  Eat small amounts of food and sip liquids more often.  Take all medicines as told by your caregiver. SEEK MEDICAL CARE IF:  You have not improved after 2 days, or you get worse.  You have a headache. SEEK IMMEDIATE MEDICAL CARE IF:   You have a fever.  You faint.  You keep vomiting or have blood in your vomit.  You are extremely weak or dehydrated.  You have dark or bloody stools.  You have severe chest or abdominal pain. MAKE SURE YOU:  Understand these instructions.  Will watch your condition.  Will get help right away if you are not doing well or get worse. Document Released: 10/16/2004 Document Revised: 06/02/2012 Document Reviewed: 05/21/2011 [facility] Patient Information 2014 [employer] [employer], [place]. Depression, Adult Depression refers to feeling sad, low, down in the dumps, blue, gloomy, or empty. In general, there are two kinds of depression: 1. Depression that we all experience from time to time because of upsetting life experiences, including the loss of a job or the ending of a relationship (normal sadness or normal grief). This kind of depression is considered normal, is short lived, and resolves within a few days to 2 weeks. (Depression experienced after the loss of a loved one is called bereavement. Bereavement often lasts longer than 2 weeks but normally gets better with  time.) 2. Clinical depression, which lasts longer than normal sadness or normal grief or interferes with your ability to function at home, at work, and in school. It also interferes with your personal relationships. It affects almost every aspect of your life. Clinical depression is an illness. Symptoms of depression also can be caused by conditions other than normal sadness and grief or clinical depression. Examples of these conditions are listed as follows:  Physical illness Some physical illnesses, including underactive thyroid gland (hypothyroidism), severe anemia, specific types of cancer, diabetes, uncontrolled seizures, heart and lung problems, strokes, and chronic pain are commonly associated with symptoms of depression.  Side effects of some prescription medicine In some people, certain types of prescription medicine can cause symptoms of depression.  Substance abuse Abuse of alcohol and illicit drugs can cause symptoms of depression. SYMPTOMS Symptoms of normal sadness and normal grief include the following:  Feeling sad or crying for short periods of time.  Not caring about anything (apathy).  Difficulty sleeping or sleeping too much.  No longer able to enjoy the things you used to enjoy.  Desire to be by oneself all the time (social isolation).  Lack of energy or motivation.  Difficulty concentrating or remembering.  Change in appetite or weight.  Restlessness or agitation. Symptoms of clinical depression include the same symptoms of normal sadness or normal grief and also the following symptoms:  Feeling sad or crying all the time.  Feelings of guilt or worthlessness.  Feelings of hopelessness or helplessness.  Thoughts of suicide or the desire to harm yourself (  suicidal ideation).  Loss of touch with reality (psychotic symptoms). Seeing or hearing things that are not real (hallucinations) or having false beliefs about your life or the people around you (delusions  and paranoia). DIAGNOSIS  The diagnosis of clinical depression usually is based on the severity and duration of the symptoms. Your caregiver also will ask you questions about your medical history and substance use to find out if physical illness, use of prescription medicine, or substance abuse is causing your depression. Your caregiver also may order blood tests. TREATMENT  Typically, normal sadness and normal grief do not require treatment. However, sometimes antidepressant medicine is prescribed for bereavement to ease the depressive symptoms until they resolve. The treatment for clinical depression depends on the severity of your symptoms but typically includes antidepressant medicine, counseling with a mental health professional, or a combination of both. Your caregiver will help to determine what treatment is best for you. Depression caused by physical illness usually goes away with appropriate medical treatment of the illness. If prescription medicine is causing depression, talk with your caregiver about stopping the medicine, decreasing the dose, or substituting another medicine. Depression caused by abuse of alcohol or illicit drugs abuse goes away with abstinence from these substances. Some adults need professional help in order to stop drinking or using drugs. SEEK IMMEDIATE CARE IF:  You have thoughts about hurting yourself or others.  You lose touch with reality (have psychotic symptoms).  You are taking medicine for depression and have a serious side effect. FOR MORE INFORMATION National Alliance on Mental Illness: www.nami.[employer] of Mental Health: http://www.maynard.net/ Document Released: 09/05/2000 Document Revised: 03/09/2012 Document Reviewed: 12/08/2011 [facility] Patient Information 2014 [place], [place].   Emergency Department Resource Guide 1) Find a Doctor and Pay Out of Pocket Although you won't have to find out who is covered by your insurance plan, it is a  good idea to ask around and get recommendations. You will then need to call the office and see if the doctor you have chosen will accept you as a new patient and what types of options they offer for patients who are self-pay. Some doctors offer discounts or will set up payment plans for their patients who do not have insurance, but you will need to ask so you aren't surprised when you get to your appointment.  2) Contact Your Local Health Department Not all health departments have doctors that can see patients for sick visits, but many do, so it is worth a call to see if yours does. If you don't know where your local health department is, you can check in your phone book. The CDC also has a tool to help you locate your state's health department, and many state websites also have listings of all of their local health departments.  3) Find a Walk-in Clinic If your illness is not likely to be very severe or complicated, you may want to try a walk in clinic. These are popping up all over the country in pharmacies, drugstores, and shopping centers. They're usually staffed by nurse practitioners or physician assistants that have been trained to treat common illnesses and complaints. They're usually fairly quick and inexpensive. However, if you have serious medical issues or chronic medical problems, these are probably not your best option.  No Primary Care Doctor: - Call Health Connect at  [phone] - they can help you locate a primary care doctor that  accepts your insurance, provides certain services, etc. - Physician Referral Service- [phone]  Behavioral Health Resources in the Community: Intensive Outpatient Programs Organization         Address  Phone  Notes  [facility] [address], [place], [place] [phone]   [facility] [address], [place], [place] [phone]   [facility] [address], [place], [place]   [phone]   [facility] [address],  [place], [place] [phone] or [phone]   Substance Abuse Resources Organization         Address  Phone  Notes  [facility]  [phone]   [facility]  [phone]   [facility]  [phone]   [facility]  [phone]   Residential & Outpatient Substance Abuse Program  [phone]   Psychological Services Organization         Address  Phone  Notes  [facility]  [phone]- [phone]   [facility]  [phone]- [phone]   [facility] [address], [place] [phone] or [phone]    Mobile Crisis Teams Organization         Address  Phone  Notes  [facility], Mobile Crisis Care Unit  [phone]   [facility]  [address] [place], [place] [phone]   Doristine LocksSharon DeEsch [address] [place] [phone]    Self-Help/Support Groups Organization         Address  Phone             Notes  [facility] [place] - variety of support groups  [phone] Call for more information  Narcotics Anonymous (NA), [facility] [address], [place]  2 meetings at this location   [facility] Treatment Programs Organization         Address  Phone  Notes  [facility] [address],    [place]  [phone]   [facility]  [address], [place] [phone], [place], [place] [phone]   [facility] [address], [place] [phone] Admissions: 8am-3pm M-F  [facility] [address]    [place], [place] [phone]   [facility] [address], [place], [place] [phone]   [facility] [address]  [place], [place] [phone]   [facility] - Intensive Outpatient [address], [place], [place] [phone]   [facility] ([facility] [address]  [place], [place] [phone] or [phone]   [facility] ([facility]) [address] [place], [place] [phone] Accepts Medicaid  [facility] [address]  [place] [phone] Substance Abuse/Addiction Treatment   [facility] Resources Organization         Address  Phone  Notes  [facility]  [phone]) [phone]   Angie FavaJulie Brannon, PhD [address], [address] [place], [place]   [phone] or [phone]   [facility]   [address] [place], [place] [phone]) [phone]   [facility] [address], [place], [place] [phone] Insurance/Medicaid/sponsorship through [facility] [address]                                    [place], [place] [phone] Therapy/tele-psych/case  [facility] [address]  [place], [place] [phone]    Dr. Adele Schilder  [phone]   [facility] Dept. 1) [address] [place] 2) [place] 3)  [address], [place] [phone]  [phone]   [place] [phone] or [phone] (After Hours)

## 2014-01-30 ENCOUNTER — Other Ambulatory Visit: Payer: Self-pay

## 2014-02-01 ENCOUNTER — Encounter: Payer: Self-pay | Admitting: Internal Medicine

## 2014-02-11 DIAGNOSIS — F1320 Sedative, hypnotic or anxiolytic dependence, uncomplicated: Secondary | ICD-10-CM | POA: Insufficient documentation

## 2014-03-07 ENCOUNTER — Other Ambulatory Visit: Payer: Self-pay

## 2014-03-07 MED ORDER — SIMVASTATIN 20 MG PO TABS: 20.0000 mg | ORAL_TABLET | Freq: Every day | ORAL | Status: DC

## 2014-03-22 ENCOUNTER — Emergency Department (HOSPITAL_BASED_OUTPATIENT_CLINIC_OR_DEPARTMENT_OTHER)
Admission: EM | Admit: 2014-03-22 | Discharge: 2014-03-22 | Disposition: A | Discharge status: Home / Self Care | Payer: Self-pay | Attending: Emergency Medicine | Admitting: Emergency Medicine

## 2014-03-22 ENCOUNTER — Encounter (HOSPITAL_BASED_OUTPATIENT_CLINIC_OR_DEPARTMENT_OTHER): Payer: Self-pay | Admitting: Emergency Medicine

## 2014-03-22 DIAGNOSIS — G40909 Epilepsy, unspecified, not intractable, without status epilepticus: Secondary | ICD-10-CM | POA: Insufficient documentation

## 2014-03-22 DIAGNOSIS — Z7901 Long term (current) use of anticoagulants: Secondary | ICD-10-CM | POA: Insufficient documentation

## 2014-03-22 DIAGNOSIS — M791 Myalgia, unspecified site: Secondary | ICD-10-CM

## 2014-03-22 DIAGNOSIS — Z95810 Presence of automatic (implantable) cardiac defibrillator: Secondary | ICD-10-CM | POA: Insufficient documentation

## 2014-03-22 DIAGNOSIS — F17200 Nicotine dependence, unspecified, uncomplicated: Secondary | ICD-10-CM | POA: Insufficient documentation

## 2014-03-22 DIAGNOSIS — Z792 Long term (current) use of antibiotics: Secondary | ICD-10-CM | POA: Insufficient documentation

## 2014-03-22 DIAGNOSIS — IMO0002 Reserved for concepts with insufficient information to code with codable children: Secondary | ICD-10-CM

## 2014-03-22 DIAGNOSIS — Z950 Presence of cardiac pacemaker: Secondary | ICD-10-CM | POA: Insufficient documentation

## 2014-03-22 DIAGNOSIS — F329 Major depressive disorder, single episode, unspecified: Secondary | ICD-10-CM | POA: Insufficient documentation

## 2014-03-22 DIAGNOSIS — W57XXXA Bitten or stung by nonvenomous insect and other nonvenomous arthropods, initial encounter: Secondary | ICD-10-CM

## 2014-03-22 DIAGNOSIS — I10 Essential (primary) hypertension: Secondary | ICD-10-CM | POA: Insufficient documentation

## 2014-03-22 DIAGNOSIS — Y939 Activity, unspecified: Secondary | ICD-10-CM | POA: Insufficient documentation

## 2014-03-22 DIAGNOSIS — I2510 Atherosclerotic heart disease of native coronary artery without angina pectoris: Secondary | ICD-10-CM | POA: Insufficient documentation

## 2014-03-22 DIAGNOSIS — IMO0001 Reserved for inherently not codable concepts without codable children: Secondary | ICD-10-CM | POA: Insufficient documentation

## 2014-03-22 DIAGNOSIS — Y929 Unspecified place or not applicable: Secondary | ICD-10-CM | POA: Insufficient documentation

## 2014-03-22 DIAGNOSIS — I4891 Unspecified atrial fibrillation: Secondary | ICD-10-CM | POA: Insufficient documentation

## 2014-03-22 DIAGNOSIS — F411 Generalized anxiety disorder: Secondary | ICD-10-CM | POA: Insufficient documentation

## 2014-03-22 DIAGNOSIS — F3289 Other specified depressive episodes: Secondary | ICD-10-CM | POA: Insufficient documentation

## 2014-03-22 DIAGNOSIS — Z8673 Personal history of transient ischemic attack (TIA), and cerebral infarction without residual deficits: Secondary | ICD-10-CM | POA: Insufficient documentation

## 2014-03-22 DIAGNOSIS — J449 Chronic obstructive pulmonary disease, unspecified: Secondary | ICD-10-CM | POA: Insufficient documentation

## 2014-03-22 DIAGNOSIS — J4489 Other specified chronic obstructive pulmonary disease: Secondary | ICD-10-CM | POA: Insufficient documentation

## 2014-03-22 DIAGNOSIS — Z79899 Other long term (current) drug therapy: Secondary | ICD-10-CM | POA: Insufficient documentation

## 2014-03-22 DIAGNOSIS — M1710 Unilateral primary osteoarthritis, unspecified knee: Secondary | ICD-10-CM | POA: Insufficient documentation

## 2014-03-22 DIAGNOSIS — E785 Hyperlipidemia, unspecified: Secondary | ICD-10-CM | POA: Insufficient documentation

## 2014-03-22 DIAGNOSIS — S90569A Insect bite (nonvenomous), unspecified ankle, initial encounter: Secondary | ICD-10-CM | POA: Insufficient documentation

## 2014-03-22 MED ORDER — ALBUTEROL SULFATE HFA 108 (90 BASE) MCG/ACT IN AERS
2.0000 | INHALATION_SPRAY | Freq: Once | RESPIRATORY_TRACT | Status: AC
  Administered 2014-03-22: 2 via RESPIRATORY_TRACT
  Filled 2014-03-22: qty 6.7

## 2014-03-22 MED ORDER — DOXYCYCLINE HYCLATE 100 MG PO TABS
100.0000 mg | ORAL_TABLET | Freq: Once | ORAL | Status: AC
  Administered 2014-03-22: 100 mg via ORAL
  Filled 2014-03-22: qty 1

## 2014-03-22 MED ORDER — DOXYCYCLINE HYCLATE 100 MG PO CAPS: 100.0000 mg | ORAL_CAPSULE | Freq: Two times a day (BID) | ORAL | Status: DC

## 2014-03-22 NOTE — Discharge Instructions (Signed)
Tick Bite Information Ticks are insects that attach themselves to the skin and draw blood for food. There are various types of ticks. Common types include wood ticks and deer ticks. Most ticks live in shrubs and grassy areas. Ticks can climb onto your body when you make contact with leaves or grass where the tick is waiting. The most common places on the body for ticks to attach themselves are the scalp, neck, armpits, waist, and groin. Most tick bites are harmless, but sometimes ticks carry germs that cause diseases. These germs can be spread to a person during the tick's feeding process. The chance of a disease spreading through a tick bite depends on:   The type of tick.  Time of year.   How long the tick is attached.   Geographic location.  HOW CAN YOU PREVENT TICK BITES? Take these steps to help prevent tick bites when you are outdoors:  Wear protective clothing. Long sleeves and long pants are best.   Wear white clothes so you can see ticks more easily.  Tuck your pant legs into your socks.   If walking on a trail, stay in the middle of the trail to avoid brushing against bushes.  Avoid walking through areas with long grass.  Put insect repellent on all exposed skin and along boot tops, pant legs, and sleeve cuffs.   Check clothing, hair, and skin repeatedly and before going inside.   Brush off any ticks that are not attached.  Take a shower or bath as soon as possible after being outdoors.  WHAT IS THE PROPER WAY TO REMOVE A TICK? Ticks should be removed as soon as possible to help prevent diseases caused by tick bites. 1. If latex gloves are available, put them on before trying to remove a tick.  2. Using fine-point tweezers, grasp the tick as close to the skin as possible. You may also use curved forceps or a tick removal tool. Grasp the tick as close to its head as possible. Avoid grasping the tick on its body. 3. Pull gently with steady upward pressure until  the tick lets go. Do not twist the tick or jerk it suddenly. This may break off the tick's head or mouth parts. 4. Do not squeeze or crush the tick's body. This could force disease-carrying fluids from the tick into your body.  5. After the tick is removed, wash the bite area and your hands with soap and water or other disinfectant such as alcohol. 6. Apply a small amount of antiseptic cream or ointment to the bite site.  7. Wash and disinfect any instruments that were used.  Do not try to remove a tick by applying a hot match, petroleum jelly, or fingernail polish to the tick. These methods do not work and may increase the chances of disease being spread from the tick bite.  WHEN SHOULD YOU SEEK MEDICAL CARE? Contact your health care provider if you are unable to remove a tick from your skin or if a part of the tick breaks off and is stuck in the skin.  After a tick bite, you need to be aware of signs and symptoms that could be related to diseases spread by ticks. Contact your health care provider if you develop any of the following in the days or weeks after the tick bite:  Unexplained fever.  Rash. A circular rash that appears days or weeks after the tick bite may indicate the possibility of Lyme disease. The rash may resemble   a target with a bull's-eye and may occur at a different part of your body than the tick bite.  Redness and swelling in the area of the tick bite.   Tender, swollen lymph glands.   Diarrhea.   Weight loss.   Cough.   Fatigue.   Muscle, joint, or bone pain.   Abdominal pain.   Headache.   Lethargy or a change in your level of consciousness.  Difficulty walking or moving your legs.   Numbness in the legs.   Paralysis.  Shortness of breath.   Confusion.   Repeated vomiting.  Document Released: 09/05/2000 Document Revised: 06/29/2013 Document Reviewed: 02/16/2013 ExitCare Patient Information 2015 ExitCare, LLC. This information is  not intended to replace advice given to you by your health care provider. Make sure you discuss any questions you have with your health care provider.  

## 2014-03-22 NOTE — ED Provider Notes (Signed)
CSN: [id number]     Arrival date & time 03/22/14  1727 History   First MD Initiated Contact with Patient 03/22/14 1742     Chief Complaint  Patient presents with  . Insect Bite     (Consider location/radiation/quality/duration/timing/severity/associated sxs/prior Treatment) Patient is a 63 y.o. female presenting with leg pain. The history is provided by the patient. No language interpreter was used.  Leg Pain Location:  Leg Time since incident:  4 days Injury: no   Leg location:  R leg Pain details:    Quality:  Aching   Radiates to:  Does not radiate   Severity:  No pain   Onset quality:  Sudden   Timing:  Constant   Progression:  Worsening Chronicity:  New  Pt reports she was bitten by a tick and pulled tick off for 4 days ago.  Pt complains of redness at site and bodyaches.   Past Medical History  Diagnosis Date  . Coronary artery disease     non obstructive  . Stroke   . Seizures   . Anxiety   . Depression   . Migraine   . Fibromuscular dysplasia   . Tobacco abuse   . COPD (chronic obstructive pulmonary disease)   . Arthritis of knee     Bilateral  . Tobacco abuse   . Migraine headache   . Anxiety disorder   . Depression   . Seizure disorder   . Cerebrovascular disease   . CAD (coronary artery disease)   . Automatic implantable cardiac defibrillator in situ     [employer] ICD  . HTN (hypertension)   . HLD (hyperlipidemia)   . Ventricular fibrillation   . Long QT syndrome   . Cardiac arrest   . Atrial fibrillation     a) 2D echo 09/11/12: LVEF 55-60%, mild LVH, mld LA dilatation   Past Surgical History  Procedure Laterality Date  . Pacemaker insertion  05/01/2003    ICD, implantation of a Guidant single chamber defibrillator, Doylene CanningGregg W. Ladona Ridgelaylor MD  . Cholecystectomy    . Tubal ligation    . Breast lumpectomy     Family History  Problem Relation Age of Onset  . Dementia Mother   . Stroke Mother     Multiple  . Aneurysm Sister     Brain  .  Uterine cancer Other     Grandmother  . Colon cancer Sister    History  Substance Use Topics  . Smoking status: Current Some Day Smoker -- 1.00 packs/day for 40 years    Types: Cigarettes  . Smokeless tobacco: Never Used     Comment: almost a pack a week  . Alcohol Use: No   OB History   Grav Para Term Preterm Abortions TAB SAB Ect Mult Living                 Review of Systems  Skin: Positive for wound.  All other systems reviewed and are negative.     Allergies  Azithromycin; Ibuprofen; Codeine; and Demerol  Home Medications   Prior to Admission medications   Medication Sig Start Date End Date Taking? Authorizing Provider  acetaminophen (TYLENOL) 500 MG tablet Take 1,000 mg by mouth every 6 (six) hours as needed for pain.    Historical Provider, MD  albuterol (PROVENTIL HFA;VENTOLIN HFA) 108 (90 BASE) MCG/ACT inhaler Inhale 2 puffs into the lungs every 6 (six) hours as needed for wheezing.    Historical Provider, MD  amoxicillin (AMOXIL)  500 MG capsule Take 500 mg by mouth 2 (two) times daily.    Historical Provider, MD  amoxicillin (AMOXIL) 500 MG capsule Take 1 capsule (500 mg total) by mouth 3 (three) times daily. 01/06/14   Glynn OctaveStephen Rancour, MD  clonazePAM (KLONOPIN) 1 MG tablet 1 mg. As diretced 10/25/13 11/24/13  Historical Provider, MD  diazepam (VALIUM) 2 MG tablet Take 1 tablet (2 mg total) by mouth every 8 (eight) hours as needed for anxiety. 01/06/14   Glynn OctaveStephen Rancour, MD  diphenhydrAMINE (BENADRYL) 25 MG tablet Take 25 mg by mouth every 6 (six) hours as needed for allergies.     Historical Provider, MD  diphenhydramine-acetaminophen (TYLENOL PM) 25-500 MG TABS Take 1 tablet by mouth at bedtime as needed (for sleep).     Historical Provider, MD  doxycycline (VIBRAMYCIN) 100 MG capsule Take 1 capsule (100 mg total) by mouth 2 (two) times daily. 03/22/14   Elson AreasLeslie K Sofia, PA-C  doxycycline (VIBRAMYCIN) 100 MG capsule Take 1 capsule (100 mg total) by mouth 2 (two) times daily.  03/22/14   Elson AreasLeslie K Sofia, PA-C  loperamide (IMODIUM A-D) 2 MG tablet Take 4 mg by mouth 3 (three) times daily as needed for diarrhea or loose stools.    Historical Provider, MD  metoprolol tartrate (LOPRESSOR) 25 MG tablet Take 1 tablet (25 mg total) by mouth 2 (two) times daily. 11/29/13   Iran OuchMuhammad A Arida, MD  mometasone (NASONEX) 50 MCG/ACT nasal spray Place 2 sprays into the nose daily.    Historical Provider, MD  Rivaroxaban (XARELTO) 15 MG TABS tablet Take 1 tablet (15 mg total) by mouth daily with supper. 11/01/13   Iran OuchMuhammad A Arida, MD  sertraline (ZOLOFT) 100 MG tablet Take 150 mg by mouth at bedtime.    Historical Provider, MD  simvastatin (ZOCOR) 20 MG tablet Take 1 tablet (20 mg total) by mouth at bedtime. 03/07/14   [place] OuchMuhammad A Arida, MD   BP 117/72  Pulse 70  Temp(Src) 98.2 F (36.8 C) (Oral)  Resp 20  Ht 5\' 4"  (1.626 m)  Wt 165 lb (74.844 kg)  BMI 28.31 kg/m2  SpO2 97% Physical Exam  Nursing note and vitals reviewed. Constitutional: She is oriented to person, place, and time. She appears well-developed and well-nourished.  HENT:  Head: Normocephalic and atraumatic.  Eyes: Pupils are equal, round, and reactive to light.  Cardiovascular: Normal rate.   Pulmonary/Chest: Effort normal.  Musculoskeletal: She exhibits tenderness.  Quarter size red area right lower leg.  Neurological: She is alert and oriented to person, place, and time. She has normal reflexes.  Skin: There is erythema.  Psychiatric: She has a normal mood and affect.    ED Course  Procedures (including critical care time) Labs Review Labs Reviewed  ROCKY MTN SPOTTED FVR AB, IGG-BLOOD  B. BURGDORFI ANTIBODIES    Imaging Review No results found.   EKG Interpretation None      MDM Pt is also out of her albuterol inhaler.   Pt request a refill.   Pt given inhaler here.  Pt given doxycycline.     Final diagnoses:  Tick bite  Muscle ache    rmsf and lyme titer obtained.      Lonia SkinnerLeslie K SardisSofia,  PA-C 03/22/14 2035

## 2014-03-22 NOTE — ED Notes (Signed)
Removal of tick to right lower leg x 4 days ago

## 2014-03-23 LAB — ROCKY MTN SPOTTED FVR AB, IGG-BLOOD: RMSF IgG: 0.49 IV

## 2014-03-23 LAB — B. BURGDORFI ANTIBODIES: B burgdorferi Ab IgG+IgM: 0.74 {ISR}

## 2014-03-28 NOTE — ED Provider Notes (Signed)
Medical screening examination/treatment/procedure(s) were performed by non-physician practitioner and as supervising physician I was immediately available for consultation/collaboration.   EKG Interpretation None        Rolland PorterMark Feleshia Zundel, MD 03/28/14 [phone]

## 2014-04-23 ENCOUNTER — Encounter (HOSPITAL_BASED_OUTPATIENT_CLINIC_OR_DEPARTMENT_OTHER): Payer: Self-pay | Admitting: Emergency Medicine

## 2014-04-23 ENCOUNTER — Emergency Department (HOSPITAL_BASED_OUTPATIENT_CLINIC_OR_DEPARTMENT_OTHER)
Admission: EM | Admit: 2014-04-23 | Discharge: 2014-04-23 | Disposition: A | Discharge status: Home / Self Care | Payer: Self-pay | Attending: Emergency Medicine | Admitting: Emergency Medicine

## 2014-04-23 DIAGNOSIS — R519 Headache, unspecified: Secondary | ICD-10-CM

## 2014-04-23 DIAGNOSIS — F17200 Nicotine dependence, unspecified, uncomplicated: Secondary | ICD-10-CM | POA: Insufficient documentation

## 2014-04-23 DIAGNOSIS — J4489 Other specified chronic obstructive pulmonary disease: Secondary | ICD-10-CM | POA: Insufficient documentation

## 2014-04-23 DIAGNOSIS — J0140 Acute pansinusitis, unspecified: Secondary | ICD-10-CM

## 2014-04-23 DIAGNOSIS — Z8673 Personal history of transient ischemic attack (TIA), and cerebral infarction without residual deficits: Secondary | ICD-10-CM | POA: Insufficient documentation

## 2014-04-23 DIAGNOSIS — IMO0002 Reserved for concepts with insufficient information to code with codable children: Secondary | ICD-10-CM | POA: Insufficient documentation

## 2014-04-23 DIAGNOSIS — E785 Hyperlipidemia, unspecified: Secondary | ICD-10-CM | POA: Insufficient documentation

## 2014-04-23 DIAGNOSIS — F411 Generalized anxiety disorder: Secondary | ICD-10-CM | POA: Insufficient documentation

## 2014-04-23 DIAGNOSIS — I10 Essential (primary) hypertension: Secondary | ICD-10-CM | POA: Insufficient documentation

## 2014-04-23 DIAGNOSIS — I2510 Atherosclerotic heart disease of native coronary artery without angina pectoris: Secondary | ICD-10-CM | POA: Insufficient documentation

## 2014-04-23 DIAGNOSIS — J018 Other acute sinusitis: Secondary | ICD-10-CM | POA: Insufficient documentation

## 2014-04-23 DIAGNOSIS — G40909 Epilepsy, unspecified, not intractable, without status epilepticus: Secondary | ICD-10-CM | POA: Insufficient documentation

## 2014-04-23 DIAGNOSIS — J449 Chronic obstructive pulmonary disease, unspecified: Secondary | ICD-10-CM | POA: Insufficient documentation

## 2014-04-23 DIAGNOSIS — F3289 Other specified depressive episodes: Secondary | ICD-10-CM | POA: Insufficient documentation

## 2014-04-23 DIAGNOSIS — Z7901 Long term (current) use of anticoagulants: Secondary | ICD-10-CM | POA: Insufficient documentation

## 2014-04-23 DIAGNOSIS — I4891 Unspecified atrial fibrillation: Secondary | ICD-10-CM | POA: Insufficient documentation

## 2014-04-23 DIAGNOSIS — Z792 Long term (current) use of antibiotics: Secondary | ICD-10-CM | POA: Insufficient documentation

## 2014-04-23 DIAGNOSIS — R51 Headache: Secondary | ICD-10-CM | POA: Insufficient documentation

## 2014-04-23 DIAGNOSIS — M1710 Unilateral primary osteoarthritis, unspecified knee: Secondary | ICD-10-CM | POA: Insufficient documentation

## 2014-04-23 DIAGNOSIS — F329 Major depressive disorder, single episode, unspecified: Secondary | ICD-10-CM | POA: Insufficient documentation

## 2014-04-23 DIAGNOSIS — Z79899 Other long term (current) drug therapy: Secondary | ICD-10-CM | POA: Insufficient documentation

## 2014-04-23 DIAGNOSIS — G43909 Migraine, unspecified, not intractable, without status migrainosus: Secondary | ICD-10-CM | POA: Insufficient documentation

## 2014-04-23 MED ORDER — DIPHENHYDRAMINE HCL 25 MG PO TABS: 25.0000 mg | ORAL_TABLET | Freq: Four times a day (QID) | ORAL | Status: DC | PRN

## 2014-04-23 MED ORDER — FLUTICASONE PROPIONATE 50 MCG/ACT NA SUSP: 2.0000 | Freq: Every day | NASAL | Status: DC

## 2014-04-23 MED ORDER — SODIUM CHLORIDE-SODIUM BICARB 2300-700 MG NA KIT: 1.0000 "application " | PACK | Freq: Three times a day (TID) | NASAL | Status: DC

## 2014-04-23 NOTE — Discharge Instructions (Signed)
Continue to stay well-hydrated. Gargle warm salt water and spit it out. Continue to alternate between Tylenol and Ibuprofen for pain or fever. May consider over-the-counter Benadryl for additional relief. Use netipot to help flush your sinuses, and use flonase as directed to help decrease swelling in your sinuses. Followup with your primary care doctor in 5-7 days for recheck of ongoing symptoms. Return to emergency department for emergent changing or worsening of symptoms.   Sinusitis Sinusitis is redness, soreness, and puffiness (inflammation) of the air pockets in the bones of your face (sinuses). The redness, soreness, and puffiness can cause air and mucus to get trapped in your sinuses. This can allow germs to grow and cause an infection.  HOME CARE   Drink enough fluids to keep your pee (urine) clear or pale yellow.  Use a humidifier in your home.  Run a hot shower to create steam in the bathroom. Sit in the bathroom with the door closed. Breathe in the steam 3-4 times a day.  Put a warm, moist washcloth on your face 3-4 times a day, or as told by your doctor.  Use salt water sprays (saline sprays) to wet the thick fluid in your nose. This can help the sinuses drain.  Only take medicine as told by your doctor. GET HELP RIGHT AWAY IF:   Your pain gets worse.  You have very bad headaches.  You are sick to your stomach (nauseous).  You throw up (vomit).  You are very sleepy (drowsy) all the time.  Your face is puffy (swollen).  Your vision changes.  You have a stiff neck.  You have trouble breathing. MAKE SURE YOU:   Understand these instructions.  Will watch your condition.  Will get help right away if you are not doing well or get worse. Document Released: 02/25/2008 Document Revised: 06/02/2012 Document Reviewed: 04/13/2012 [facility] Patient Information 2015 [place], [place]. This information is not intended to replace advice given to you by your health care provider.  Make sure you discuss any questions you have with your health care provider.  Sinus Headache A sinus headache happens when your sinuses become clogged or puffy (swollen). Sinus headaches can be mild or severe. HOME CARE  Take your medicines (antibiotics) as told. Finish them even if you start to feel better.  Only take medicine as told by your doctor.  Use a nose spray if you feel stuffed up (congested). GET HELP RIGHT AWAY IF:  You have a fever.  You have trouble seeing.  You suddenly have pain in your face or head.  You start to twitch or shake (seizure).  You are confused.  You get headaches more than once a week.  Light or sound bothers you.  You feel sick to your stomach (nauseous) or throw up (vomit).  Your headaches do not get better with treatment. MAKE SURE YOU:  Understand these instructions.  Will watch your condition.  Will get help right away if you are not doing well or get worse. Document Released: 01/08/2011 Document Revised: 12/01/2011 Document Reviewed: 01/08/2011 [facility] Patient Information 2015 [place], [place]. This information is not intended to replace advice given to you by your health care provider. Make sure you discuss any questions you have with your health care provider.  Hypertension Hypertension is another name for high blood pressure. High blood pressure forces your heart to work harder to pump blood. A blood pressure reading has two numbers, which includes a higher number over a lower number (example: 110/72). HOME CARE  Have your blood pressure rechecked by your doctor.  Only take medicine as told by your doctor. Follow the directions carefully. The medicine does not work as well if you skip doses. Skipping doses also puts you at risk for problems.  Do not smoke.  Monitor your blood pressure at home as told by your doctor. GET HELP IF:  You think you are having a reaction to the medicine you are taking.  You have repeat  headaches or feel dizzy.  You have puffiness (swelling) in your ankles.  You have trouble with your vision. GET HELP RIGHT AWAY IF:   You get a very bad headache and are confused.  You feel weak, numb, or faint.  You get chest or belly (abdominal) pain.  You throw up (vomit).  You cannot breathe very well. MAKE SURE YOU:   Understand these instructions.  Will watch your condition.  Will get help right away if you are not doing well or get worse. Document Released: 02/25/2008 Document Revised: 09/13/2013 Document Reviewed: 07/01/2013 [facility] Patient Information 2015 [place]. This information is not intended to replace advice given to you by your health care provider. Make sure you discuss any questions you have with your health care provider.

## 2014-04-23 NOTE — ED Notes (Signed)
Pt reports since yesterday with sinus pressure and congestion.  Reports "i think i have a sinus infection".

## 2014-04-23 NOTE — ED Notes (Signed)
Patient left after discussing discharge instructions with MD and did not receive printed instructions, lefft message for patient to call ER to review and answer any questions

## 2014-04-23 NOTE — ED Provider Notes (Signed)
CSN: [id number]     Arrival date & time 04/23/14  1632 History   First MD Initiated Contact with Patient 04/23/14 1707     Chief Complaint  Patient presents with  . Facial Pain     (Consider location/radiation/quality/duration/timing/severity/associated sxs/prior Treatment) HPI Comments: [name] is a 63 y.o. Female with a significant PMHx of COPD, migraine, HTN, HLD, and remote CVA, presenting today with complaints of Sinus congestion x4 days with an associated headache. She states that she began having clear rhinorrhea and a frontal, throbbing, constant, nonradiating, moderate in severity HA, for which she tried Tylenol 1000 mg with no relief, and is aggravated by blowing her nose. She does endorse some postnasal drip, which has led to a slight scratchy throat at night. Denies a sore throat at this time. Denies any neck pain or neck swelling. She denies any fevers, chills, vision changes, itchy or watery eyes, ear pain, cough, chest congestion, shortness of breath, chest pain, abdominal pain, nausea, vomiting, diarrhea, constipation, dizziness, lightheadedness, syncope, myalgias, or arthralgias. Denies any known sick contacts at home. Denies any environmental allergies. States this HA does not feel like her HTN related headaches or migraines. States she usually takes her HTN meds at night, and had not taken them today.   Patient is a 63 y.o. female presenting with URI. The history is provided by the patient. No language interpreter was used.  URI Presenting symptoms: congestion, facial pain, rhinorrhea and sore throat   Presenting symptoms: no cough, no ear pain, no fatigue and no fever   Severity:  Moderate Onset quality:  Gradual Duration:  4 days Timing:  Constant Progression:  Unchanged Chronicity:  New Relieved by:  Certain positions and rest Worsened by:  Nothing tried Ineffective treatments: tylenol. Associated symptoms: headaches and sinus pain   Associated symptoms: no  arthralgias, no myalgias, no neck pain, no sneezing, no swollen glands and no wheezing   Risk factors: being elderly   Risk factors: no recent travel and no sick contacts     Past Medical History  Diagnosis Date  . Coronary artery disease     non obstructive  . Stroke   . Seizures   . Anxiety   . Depression   . Migraine   . Fibromuscular dysplasia   . Tobacco abuse   . COPD (chronic obstructive pulmonary disease)   . Arthritis of knee     Bilateral  . Tobacco abuse   . Migraine headache   . Anxiety disorder   . Depression   . Seizure disorder   . Cerebrovascular disease   . CAD (coronary artery disease)   . Automatic implantable cardiac defibrillator in situ     [employer] ICD  . HTN (hypertension)   . HLD (hyperlipidemia)   . Ventricular fibrillation   . Long QT syndrome   . Cardiac arrest   . Atrial fibrillation     a) 2D echo 09/11/12: LVEF 55-60%, mild LVH, mld LA dilatation   Past Surgical History  Procedure Laterality Date  . Pacemaker insertion  05/01/2003    ICD, implantation of a Guidant single chamber defibrillator, Champ Mungo. Lovena Le MD  . Cholecystectomy    . Tubal ligation    . Breast lumpectomy     Family History  Problem Relation Age of Onset  . Dementia Mother   . Stroke Mother     Multiple  . Aneurysm Sister     Brain  . Uterine cancer Other  Grandmother  . Colon cancer Sister    History  Substance Use Topics  . Smoking status: Current Some Day Smoker -- 0.25 packs/day for 40 years    Types: Cigarettes  . Smokeless tobacco: Never Used     Comment: almost a pack a week  . Alcohol Use: No   OB History   Grav Para Term Preterm Abortions TAB SAB Ect Mult Living                 Review of Systems  Constitutional: Negative for fever and fatigue.  HENT: Positive for congestion, postnasal drip, rhinorrhea, sinus pressure and sore throat. Negative for ear discharge, ear pain, facial swelling, hearing loss, sneezing, tinnitus and  trouble swallowing.   Eyes: Negative for discharge, redness, itching and visual disturbance.  Respiratory: Negative for cough, chest tightness and wheezing.   Cardiovascular: Negative for chest pain.  Gastrointestinal: Negative for nausea, vomiting, abdominal pain, diarrhea and constipation.  Genitourinary: Negative for dysuria.  Musculoskeletal: Negative for arthralgias, myalgias and neck pain.  Skin: Negative for rash.  Allergic/Immunologic: Negative for environmental allergies.  Neurological: Positive for headaches. Negative for dizziness, syncope, weakness and light-headedness.      Allergies  Azithromycin; Ibuprofen; Codeine; and Demerol  Home Medications   Prior to Admission medications   Medication Sig Start Date End Date Taking? Authorizing Provider  acetaminophen (TYLENOL) 500 MG tablet Take 1,000 mg by mouth every 6 (six) hours as needed for pain.    Historical Provider, MD  albuterol (PROVENTIL HFA;VENTOLIN HFA) 108 (90 BASE) MCG/ACT inhaler Inhale 2 puffs into the lungs every 6 (six) hours as needed for wheezing.    Historical Provider, MD  amoxicillin (AMOXIL) 500 MG capsule Take 500 mg by mouth 2 (two) times daily.    Historical Provider, MD  amoxicillin (AMOXIL) 500 MG capsule Take 1 capsule (500 mg total) by mouth 3 (three) times daily. 01/06/14   Ezequiel Essex, MD  clonazePAM (KLONOPIN) 1 MG tablet 1 mg. As diretced 10/25/13 11/24/13  Historical Provider, MD  diazepam (VALIUM) 2 MG tablet Take 1 tablet (2 mg total) by mouth every 8 (eight) hours as needed for anxiety. 01/06/14   Ezequiel Essex, MD  diphenhydrAMINE (BENADRYL) 25 MG tablet Take 25 mg by mouth every 6 (six) hours as needed for allergies.     Historical Provider, MD  diphenhydrAMINE (BENADRYL) 25 MG tablet Take 1 tablet (25 mg total) by mouth every 6 (six) hours as needed (sinus congestion and runny nose). 04/23/14   Marsha Hillman Strupp Camprubi-Soms, PA-C  diphenhydramine-acetaminophen (TYLENOL PM) 25-500 MG TABS  Take 1 tablet by mouth at bedtime as needed (for sleep).     Historical Provider, MD  doxycycline (VIBRAMYCIN) 100 MG capsule Take 1 capsule (100 mg total) by mouth 2 (two) times daily. 03/22/14   Fransico Meadow, PA-C  doxycycline (VIBRAMYCIN) 100 MG capsule Take 1 capsule (100 mg total) by mouth 2 (two) times daily. 03/22/14   Fransico Meadow, PA-C  fluticasone [facility]) 50 MCG/ACT nasal spray Place 2 sprays into both nostrils daily. 04/23/14   Caretha Rumbaugh Strupp Camprubi-Soms, PA-C  loperamide (IMODIUM A-D) 2 MG tablet Take 4 mg by mouth 3 (three) times daily as needed for diarrhea or loose stools.    Historical Provider, MD  metoprolol tartrate (LOPRESSOR) 25 MG tablet Take 1 tablet (25 mg total) by mouth 2 (two) times daily. 11/29/13   Wellington Hampshire, MD  mometasone (NASONEX) 50 MCG/ACT nasal spray Place 2 sprays into the nose daily.  Historical Provider, MD  Rivaroxaban (XARELTO) 15 MG TABS tablet Take 1 tablet (15 mg total) by mouth daily with supper. 11/01/13   Wellington Hampshire, MD  sertraline (ZOLOFT) 100 MG tablet Take 150 mg by mouth at bedtime.    Historical Provider, MD  simvastatin (ZOCOR) 20 MG tablet Take 1 tablet (20 mg total) by mouth at bedtime. 03/07/14   Wellington Hampshire, MD  Sodium Chloride-Sodium Bicarb (NETI POT SINUS [place]) 2300-700 MG KIT Place 1 application into the nose 3 (three) times daily. Until sinus congestion subsides. 04/23/14   Shan Valdes Strupp Camprubi-Soms, PA-C   BP 128/106  Pulse 93  Temp(Src) 98.6 F (37 C) (Oral)  Resp 18  Ht 5' 4"  (1.626 m)  Wt 165 lb (74.844 kg)  BMI 28.31 kg/m2  SpO2 96% Physical Exam  Nursing note and vitals reviewed. Constitutional: She is oriented to person, place, and time. Vital signs are normal. She appears well-developed and well-nourished. No distress.  Afebrile, nontoxic, NAD  HENT:  Head: Normocephalic and atraumatic.  Right Ear: Hearing, tympanic membrane, external ear and ear canal normal.  Left Ear: Hearing, tympanic membrane,  external ear and ear canal normal.  Nose: Mucosal edema, rhinorrhea and sinus tenderness present. Right sinus exhibits maxillary sinus tenderness and frontal sinus tenderness. Left sinus exhibits maxillary sinus tenderness and frontal sinus tenderness.  Mouth/Throat: Uvula is midline and mucous membranes are normal. No trismus in the jaw. Posterior oropharyngeal erythema present. No oropharyngeal exudate, posterior oropharyngeal edema or tonsillar abscesses.  [place]/AT, b/l ears clear with no pain upon pulling pinna and no TM injection or bulging. B/l nasal turbinates with significant mucosal edema and erythema, with limited air entry in bilateral nares, and clear rhinorrhea present. Oropharynx with no exudates or edema, no trismus, no uvular deviation, but with posterior oropharynx mildly injected. Sinuses tender to palpation in bilateral frontal and maxillary sinuses.  Eyes: Conjunctivae and EOM are normal. Pupils are equal, round, and reactive to light. Right eye exhibits no discharge. Left eye exhibits no discharge.  Neck: Normal range of motion. Neck supple. No spinous process tenderness and no muscular tenderness present. No rigidity. Normal range of motion present.  Cardiovascular: Normal rate, regular rhythm, normal heart sounds and intact distal pulses.  Exam reveals no gallop and no friction rub.   No murmur heard. RRR, normal S1/S2, no m/r/g, distal pulses intact. Repeat BP reading 137/75.  Pulmonary/Chest: Effort normal and breath sounds normal. No respiratory distress. She has no decreased breath sounds. She has no wheezes. She has no rhonchi. She has no rales.  CTAB in all lung fields, no w/r/r  Abdominal: Normal appearance. She exhibits no distension.  Musculoskeletal: Normal range of motion.  Lymphadenopathy:       Head (right side): No submental, no submandibular and no tonsillar adenopathy present.       Head (left side): No submental, no submandibular and no tonsillar adenopathy  present.    She has no cervical adenopathy.  No LAD of head/neck  Neurological: She is alert and oriented to person, place, and time. She has normal strength. No cranial nerve deficit or sensory deficit.  CN 3-12 grossly intact, sensation grossly intact in all extremities, strength 5/5 in all extremities.  Skin: Skin is warm, dry and intact. No rash noted.  Psychiatric: She has a normal mood and affect.    ED Course  Procedures (including critical care time) Labs Review Labs Reviewed - No data to display  Imaging Review No results found.  EKG Interpretation None      MDM   Final diagnoses:  Acute pansinusitis, recurrence not specified  Sinus headache  HTN (hypertension), benign    Pt here with URI/sinusitis symptoms x4 days and associated HA. BP 137/75 on repeat reading, doubt this HA is due to HTN, and neuro exam WNL therefore no imaging needed at this time. Will tx with flonase and discussed use of netipot for sinus flushing. Pt is afebrile with a clear lung exam. Mild rhinorrhea. Pt is agreeable to symptomatic treatment with close follow up with PCP as needed but spoke at length about emergent changing or worsening of symptoms that should prompt return to ER. Pt voices understanding and is agreeable to plan. Stable at time of d/c.  BP 137/75  Pulse 93  Temp(Src) 98.6 F (37 C) (Oral)  Resp 18  Ht 5' 4"  (1.626 m)  Wt 165 lb (74.844 kg)  BMI 28.31 kg/m2  SpO2 96%  Meds ordered this encounter  Medications  . fluticasone (FLONASE) 50 MCG/ACT nasal spray    Sig: Place 2 sprays into both nostrils daily.    Dispense:  16 g    Refill:  0    Order Specific Question:  Supervising Provider    Answer:  Noemi Chapel D [[id number]]  . Sodium Chloride-Sodium Bicarb (NETI POT SINUS WASH) 2300-700 MG KIT    Sig: Place 1 application into the nose 3 (three) times daily. Until sinus congestion subsides.    Dispense:  30 each    Refill:  0    Order Specific Question:  Supervising  Provider    Answer:  Noemi Chapel D [[id number]]  . diphenhydrAMINE (BENADRYL) 25 MG tablet    Sig: Take 1 tablet (25 mg total) by mouth every 6 (six) hours as needed (sinus congestion and runny nose).    Dispense:  15 tablet    Refill:  0    Order Specific Question:  Supervising Provider    Answer:  Johnna Acosta [[id number]]      [place], PA-C 04/23/14 [id number]

## 2014-04-24 NOTE — ED Provider Notes (Signed)
Medical screening examination/treatment/procedure(s) were performed by non-physician practitioner and as supervising physician I was immediately available for consultation/collaboration.   EKG Interpretation None       Samyria Rudie, MD 04/24/14 1104 

## 2014-04-25 ENCOUNTER — Ambulatory Visit (INDEPENDENT_AMBULATORY_CARE_PROVIDER_SITE_OTHER): Payer: Self-pay | Admitting: Emergency Medicine

## 2014-04-25 VITALS — BP 128/88 | HR 83 | Temp 97.8°F | Resp 18 | Ht 64.0 in | Wt 164.6 lb

## 2014-04-25 DIAGNOSIS — F3289 Other specified depressive episodes: Secondary | ICD-10-CM

## 2014-04-25 DIAGNOSIS — F32A Depression, unspecified: Secondary | ICD-10-CM

## 2014-04-25 DIAGNOSIS — G459 Transient cerebral ischemic attack, unspecified: Secondary | ICD-10-CM

## 2014-04-25 DIAGNOSIS — E782 Mixed hyperlipidemia: Secondary | ICD-10-CM

## 2014-04-25 DIAGNOSIS — F17200 Nicotine dependence, unspecified, uncomplicated: Secondary | ICD-10-CM

## 2014-04-25 DIAGNOSIS — F329 Major depressive disorder, single episode, unspecified: Secondary | ICD-10-CM

## 2014-04-25 DIAGNOSIS — I4891 Unspecified atrial fibrillation: Secondary | ICD-10-CM

## 2014-04-25 MED ORDER — RIVAROXABAN 15 MG PO TABS: 15.0000 mg | ORAL_TABLET | Freq: Every day | ORAL | Status: DC

## 2014-04-25 MED ORDER — SERTRALINE HCL 100 MG PO TABS: 150.0000 mg | ORAL_TABLET | Freq: Every day | ORAL | Status: DC

## 2014-04-25 MED ORDER — DIAZEPAM 2 MG PO TABS: 2.0000 mg | ORAL_TABLET | Freq: Two times a day (BID) | ORAL | Status: DC | PRN

## 2014-04-25 NOTE — Progress Notes (Signed)
[facility] [address], [place] [address] [phone]  Date:  04/25/2014   Name:  [name]   DOB:  24-Apr-1951   MRN:  [id number]  PCP:  Thurman Coyer, MD    Chief Complaint: Establish Care and Sinusitis   History of Present Illness:  [name] is a 63 y.o. very pleasant female patient who presents with the following:  Numerous medical problems and sent here to establish care at the direction of her cardiologist. Has nasal congestion and a watery drainage and frontal headache.  Has no fever or chills. No cough or wheezing or shortness of breath. No sore throat. Continues to smoke but down to 5 cigs a day.   Needs refills on medication. No improvement with over the counter medications or other home remedies.  Denies other complaint or health concern today.   Patient Active Problem List   Diagnosis Date Noted  . Paroxysmal atrial fibrillation 11/04/2013  . Atrial fibrillation with RVR 09/12/2012  . History of ventricular fibrillation 09/12/2012  . ICD (implantable cardiac defibrillator) in place 09/12/2012  . ANXIETY DISORDER 05/22/2009  . TOBACCO ABUSE 05/22/2009  . DEPRESSION 05/22/2009  . MIGRAINE HEADACHE 05/22/2009  . CAD 05/22/2009  . Long QT syndrome 05/22/2009  . CARDIAC ARREST 05/22/2009  . CEREBROVASCULAR DISEASE 05/22/2009  . COPD 05/22/2009  . ARTHRITIS, KNEES, BILATERAL 05/22/2009  . SEIZURE DISORDER 05/22/2009  . AUTOMATIC IMPLANTABLE CARDIAC DEFIBRILLATOR SITU 05/22/2009    Past Medical History  Diagnosis Date  . Coronary artery disease     non obstructive  . Stroke   . Seizures   . Anxiety   . Depression   . Migraine   . Fibromuscular dysplasia   . Tobacco abuse   . COPD (chronic obstructive pulmonary disease)   . Arthritis of knee     Bilateral  . Tobacco abuse   . Migraine headache   . Anxiety disorder   . Depression   . Seizure disorder   . Cerebrovascular disease   . CAD (coronary artery  disease)   . Automatic implantable cardiac defibrillator in situ     [employer] ICD  . HTN (hypertension)   . HLD (hyperlipidemia)   . Ventricular fibrillation   . Long QT syndrome   . Cardiac arrest   . Atrial fibrillation     a) 2D echo 09/11/12: LVEF 55-60%, mild LVH, mld LA dilatation  . Allergy     Past Surgical History  Procedure Laterality Date  . Pacemaker insertion  05/01/2003    ICD, implantation of a Guidant single chamber defibrillator, Champ Mungo. Lovena Le MD  . Cholecystectomy    . Tubal ligation    . Breast lumpectomy    . Appendectomy    . Cosmetic surgery      History  Substance Use Topics  . Smoking status: Current Some Day Smoker -- 0.25 packs/day for 40 years    Types: Cigarettes  . Smokeless tobacco: Never Used     Comment: almost a pack a week  . Alcohol Use: No    Family History  Problem Relation Age of Onset  . Dementia Mother   . Stroke Mother     Multiple  . Aneurysm Sister     Brain  . Stroke Sister   . Uterine cancer Other     Grandmother  . Colon cancer Sister   . Cancer Sister     Allergies  Allergen Reactions  . Azithromycin Other (  See Comments)    Hypotension   . Ibuprofen Other (See Comments)    Hypotension. Takes ASA without problem hypotension  . Codeine Rash  . Demerol [Meperidine] Rash    Medication list has been reviewed and updated.  Current Outpatient Prescriptions on File Prior to Visit  Medication Sig Dispense Refill  . acetaminophen (TYLENOL) 500 MG tablet Take 1,000 mg by mouth every 6 (six) hours as needed for pain.      Marland Kitchen albuterol (PROVENTIL HFA;VENTOLIN HFA) 108 (90 BASE) MCG/ACT inhaler Inhale 2 puffs into the lungs every 6 (six) hours as needed for wheezing.      . diazepam (VALIUM) 2 MG tablet Take 1 tablet (2 mg total) by mouth every 8 (eight) hours as needed for anxiety.  10 tablet  0  . diphenhydrAMINE (BENADRYL) 25 MG tablet Take 25 mg by mouth every 6 (six) hours as needed for allergies.        . diphenhydrAMINE (BENADRYL) 25 MG tablet Take 1 tablet (25 mg total) by mouth every 6 (six) hours as needed (sinus congestion and runny nose).  15 tablet  0  . diphenhydramine-acetaminophen (TYLENOL PM) 25-500 MG TABS Take 1 tablet by mouth at bedtime as needed (for sleep).       Marland Kitchen doxycycline (VIBRAMYCIN) 100 MG capsule Take 1 capsule (100 mg total) by mouth 2 (two) times daily.  20 capsule  0  . doxycycline (VIBRAMYCIN) 100 MG capsule Take 1 capsule (100 mg total) by mouth 2 (two) times daily.  20 capsule  0  . loperamide (IMODIUM A-D) 2 MG tablet Take 4 mg by mouth 3 (three) times daily as needed for diarrhea or loose stools.      . metoprolol tartrate (LOPRESSOR) 25 MG tablet Take 1 tablet (25 mg total) by mouth 2 (two) times daily.  180 tablet  1  . Rivaroxaban (XARELTO) 15 MG TABS tablet Take 1 tablet (15 mg total) by mouth daily with supper.  30 tablet  4  . sertraline (ZOLOFT) 100 MG tablet Take 150 mg by mouth at bedtime.      . simvastatin (ZOCOR) 20 MG tablet Take 1 tablet (20 mg total) by mouth at bedtime.  90 tablet  1  . Sodium Chloride-Sodium Bicarb (NETI POT SINUS WASH) 2300-700 MG KIT Place 1 application into the nose 3 (three) times daily. Until sinus congestion subsides.  30 each  0  . clonazePAM (KLONOPIN) 1 MG tablet 1 mg. As diretced      . fluticasone (FLONASE) 50 MCG/ACT nasal spray Place 2 sprays into both nostrils daily.  16 g  0  . mometasone (NASONEX) 50 MCG/ACT nasal spray Place 2 sprays into the nose daily.       No current facility-administered medications on file prior to visit.    Review of Systems:  As per HPI, otherwise negative.    Physical Examination: Filed Vitals:   04/25/14 1141  BP: 128/88  Pulse: 83  Temp: 97.8 F (36.6 C)  Resp: 18   Filed Vitals:   04/25/14 1141  Height: 5' 4" (1.626 m)  Weight: 164 lb 9.6 oz (74.662 kg)   Body mass index is 28.24 kg/(m^2). Ideal Body Weight: Weight in (lb) to have BMI = 25: 145.3  GEN: WDWN, NAD,  Non-toxic, A & O x 3 HEENT: Atraumatic, Normocephalic. Neck supple. No masses, No LAD. Ears and Nose: No external deformity. CV: RRR, No M/G/R. No JVD. No thrill. No extra heart sounds. PULM: CTA B, no  wheezes, crackles, rhonchi. No retractions. No resp. distress. No accessory muscle use. ABD: S, NT, ND, +BS. No rebound. No HSM. EXTR: No c/c/e NEURO Normal gait.  PSYCH: Normally interactive. Conversant. Not depressed or anxious appearing.  Calm demeanor.    Assessment and Plan: Refills Follow up in 104   Signed,  Ellison Carwin, MD

## 2014-04-25 NOTE — Patient Instructions (Signed)
Generalized Anxiety Disorder Generalized anxiety disorder (GAD) is a mental disorder. It interferes with life functions, including relationships, work, and school. GAD is different from normal anxiety, which everyone experiences at some point in their lives in response to specific life events and activities. Normal anxiety actually helps us prepare for and get through these life events and activities. Normal anxiety goes away after the event or activity is over.  GAD causes anxiety that is not necessarily related to specific events or activities. It also causes excess anxiety in proportion to specific events or activities. The anxiety associated with GAD is also difficult to control. GAD can vary from mild to severe. People with severe GAD can have intense waves of anxiety with physical symptoms (panic attacks).  SYMPTOMS The anxiety and worry associated with GAD are difficult to control. This anxiety and worry are related to many life events and activities and also occur more days than not for 6 months or longer. People with GAD also have three or more of the following symptoms (one or more in children):  Restlessness.   Fatigue.  Difficulty concentrating.   Irritability.  Muscle tension.  Difficulty sleeping or unsatisfying sleep. DIAGNOSIS GAD is diagnosed through an assessment by your health care provider. Your health care provider will ask you questions aboutyour mood,physical symptoms, and events in your life. Your health care provider may ask you about your medical history and use of alcohol or drugs, including prescription medicines. Your health care provider may also do a physical exam and blood tests. Certain medical conditions and the use of certain substances can cause symptoms similar to those associated with GAD. Your health care provider may refer you to a mental health specialist for further evaluation. TREATMENT The following therapies are usually used to treat GAD:    Medication. Antidepressant medication usually is prescribed for long-term daily control. Antianxiety medicines may be added in severe cases, especially when panic attacks occur.   Talk therapy (psychotherapy). Certain types of talk therapy can be helpful in treating GAD by providing support, education, and guidance. A form of talk therapy called cognitive behavioral therapy can teach you healthy ways to think about and react to daily life events and activities.  Stress managementtechniques. These include yoga, meditation, and exercise and can be very helpful when they are practiced regularly. A mental health specialist can help determine which treatment is best for you. Some people see improvement with one therapy. However, other people require a combination of therapies. Document Released: 01/03/2013 Document Revised: 01/23/2014 Document Reviewed: 01/03/2013 Exit[employer] Patient Information 2015 [employer]. This information is not intended to replace advice given to you by your health care provider. Make sure you discuss any questions you have with your health care provider.  

## 2014-05-02 NOTE — Progress Notes (Signed)
Unable to leave a message at this time.  Patient lives in a hotel and they are having trouble with the phone in her room so I will try again later.

## 2014-05-04 NOTE — Progress Notes (Signed)
Appointment made for Monday 9/14 at 10:45 with Dr. Patsy Lageropland

## 2014-05-12 ENCOUNTER — Emergency Department (HOSPITAL_BASED_OUTPATIENT_CLINIC_OR_DEPARTMENT_OTHER)
Admission: EM | Admit: 2014-05-12 | Discharge: 2014-05-12 | Disposition: A | Discharge status: Home / Self Care | Payer: Self-pay | Attending: Emergency Medicine | Admitting: Emergency Medicine

## 2014-05-12 ENCOUNTER — Encounter (HOSPITAL_BASED_OUTPATIENT_CLINIC_OR_DEPARTMENT_OTHER): Payer: Self-pay | Admitting: Emergency Medicine

## 2014-05-12 DIAGNOSIS — M79609 Pain in unspecified limb: Secondary | ICD-10-CM | POA: Insufficient documentation

## 2014-05-12 DIAGNOSIS — I2510 Atherosclerotic heart disease of native coronary artery without angina pectoris: Secondary | ICD-10-CM | POA: Insufficient documentation

## 2014-05-12 DIAGNOSIS — Z95810 Presence of automatic (implantable) cardiac defibrillator: Secondary | ICD-10-CM | POA: Insufficient documentation

## 2014-05-12 DIAGNOSIS — J449 Chronic obstructive pulmonary disease, unspecified: Secondary | ICD-10-CM | POA: Insufficient documentation

## 2014-05-12 DIAGNOSIS — F329 Major depressive disorder, single episode, unspecified: Secondary | ICD-10-CM | POA: Insufficient documentation

## 2014-05-12 DIAGNOSIS — I10 Essential (primary) hypertension: Secondary | ICD-10-CM | POA: Insufficient documentation

## 2014-05-12 DIAGNOSIS — M5412 Radiculopathy, cervical region: Secondary | ICD-10-CM

## 2014-05-12 DIAGNOSIS — Z8739 Personal history of other diseases of the musculoskeletal system and connective tissue: Secondary | ICD-10-CM | POA: Insufficient documentation

## 2014-05-12 DIAGNOSIS — J4489 Other specified chronic obstructive pulmonary disease: Secondary | ICD-10-CM | POA: Insufficient documentation

## 2014-05-12 DIAGNOSIS — F411 Generalized anxiety disorder: Secondary | ICD-10-CM | POA: Insufficient documentation

## 2014-05-12 DIAGNOSIS — F17200 Nicotine dependence, unspecified, uncomplicated: Secondary | ICD-10-CM | POA: Insufficient documentation

## 2014-05-12 DIAGNOSIS — F3289 Other specified depressive episodes: Secondary | ICD-10-CM | POA: Insufficient documentation

## 2014-05-12 DIAGNOSIS — E785 Hyperlipidemia, unspecified: Secondary | ICD-10-CM | POA: Insufficient documentation

## 2014-05-12 DIAGNOSIS — R569 Unspecified convulsions: Secondary | ICD-10-CM | POA: Insufficient documentation

## 2014-05-12 MED ORDER — PREDNISONE 10 MG PO TABS
60.0000 mg | ORAL_TABLET | Freq: Once | ORAL | Status: AC
  Administered 2014-05-12: 60 mg via ORAL
  Filled 2014-05-12 (×2): qty 1

## 2014-05-12 MED ORDER — PREDNISONE 20 MG PO TABS: 40.0000 mg | ORAL_TABLET | Freq: Every day | ORAL | Status: DC

## 2014-05-12 MED ORDER — HYDROCODONE-ACETAMINOPHEN 5-325 MG PO TABS
2.0000 | ORAL_TABLET | Freq: Once | ORAL | Status: AC
  Administered 2014-05-12: 2 via ORAL
  Filled 2014-05-12: qty 2

## 2014-05-12 MED ORDER — HYDROCODONE-ACETAMINOPHEN 5-325 MG PO TABS: 1.0000 | ORAL_TABLET | Freq: Four times a day (QID) | ORAL | Status: DC | PRN

## 2014-05-12 NOTE — ED Provider Notes (Signed)
CSN: [id number]     Arrival date & time 05/12/14  1837 History  This chart was scribed for Elwin Mocha, MD by Roxy Cedar, ED Scribe. This patient was seen in room MH07/MH07 and the patient's care was started at 7:24 PM.    Chief Complaint  Patient presents with  . Arm Pain   Patient is a 63 y.o. female presenting with arm pain. The history is provided by the patient. No language interpreter was used.  Arm Pain The current episode started more than 1 week ago. The problem has been gradually worsening. Pertinent negatives include no chest pain, no abdominal pain and no headaches. She has tried nothing for the symptoms.    HPI Comments: [name] is a 63 y.o. 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 Patient states she has not tried anything that relieves her pain. Patient denies injury from fall or physical activity. Patient denies associated fever, nausea, or vomiting. Patient also states associated intermittent dizziness that began earlier today. Patient denies pain in the left side or the back side of her neck. Patient denies abdominal pain.  Past Medical History  Diagnosis Date  . Coronary artery disease     non obstructive  . Stroke   . Seizures   . Anxiety   . Depression   . Migraine   . Fibromuscular dysplasia   . Tobacco abuse   . COPD (chronic obstructive pulmonary disease)   . Arthritis of knee     Bilateral  . Tobacco abuse   . Migraine headache   . Anxiety disorder   . Depression   . Seizure disorder   . Cerebrovascular disease   . CAD (coronary artery disease)   . Automatic implantable cardiac defibrillator in situ     [employer] ICD  . HTN (hypertension)   . HLD (hyperlipidemia)   . Ventricular fibrillation   .  Long QT syndrome   . Cardiac arrest   . Atrial fibrillation     a) 2D echo 09/11/12: LVEF 55-60%, mild LVH, mld LA dilatation  . Allergy    Past Surgical History  Procedure Laterality Date  . Pacemaker insertion  05/01/2003    ICD, implantation of a Guidant single chamber defibrillator, Doylene Canning. Ladona Ridgel MD  . Cholecystectomy    . Tubal ligation    . Breast lumpectomy    . Appendectomy    . Cosmetic surgery     Family History  Problem Relation Age of Onset  . Dementia Mother   . Stroke Mother     Multiple  . Aneurysm Sister     Brain  . Stroke Sister   . Uterine cancer Other     Grandmother  . Colon cancer Sister   . Cancer Sister    History  Substance Use Topics  . Smoking status: Current Some Day Smoker -- 0.25 packs/day for 40 years    Types: Cigarettes  . Smokeless tobacco: Never Used     Comment: almost a pack a week  . Alcohol Use: No   OB History   Grav Para Term Preterm Abortions TAB SAB Ect Mult Living                 Review of Systems  Constitutional: Negative for fever.  Cardiovascular: Negative for chest pain.  Gastrointestinal: Negative for nausea, vomiting and abdominal pain.  Musculoskeletal:       Right sided neck, shoulder, elbow, arm, hand pain.  Skin: Negative for wound.  Neurological: Positive for dizziness (mild). Negative for headaches.  All other systems reviewed and are negative.   Allergies  Azithromycin; Ibuprofen; Codeine; and Demerol  Home Medications   Prior to Admission medications   Medication Sig Start Date End Date Taking? Authorizing Provider  albuterol (PROVENTIL HFA;VENTOLIN HFA) 108 (90 BASE) MCG/ACT inhaler Inhale 2 puffs into the lungs every 6 (six) hours as needed for wheezing.    Historical Provider, MD  diazepam (VALIUM) 2 MG tablet Take 1 tablet (2 mg total) by mouth every 12 (twelve) hours as needed for anxiety. 04/25/14   Phillips OdorJeffery Anderson, MD  metoprolol tartrate (LOPRESSOR) 25 MG tablet Take 1 tablet (25 mg total)  by mouth 2 (two) times daily. 11/29/13   Iran OuchMuhammad A Arida, MD  mometasone (NASONEX) 50 MCG/ACT nasal spray Place 2 sprays into the nose daily.    Historical Provider, MD  Rivaroxaban (XARELTO) 15 MG TABS tablet Take 1 tablet (15 mg total) by mouth daily with supper. 04/25/14   Phillips OdorJeffery Anderson, MD  sertraline (ZOLOFT) 100 MG tablet Take 1.5 tablets (150 mg total) by mouth at bedtime. 04/25/14   Phillips OdorJeffery Anderson, MD  simvastatin (ZOCOR) 20 MG tablet Take 1 tablet (20 mg total) by mouth at bedtime. 03/07/14   Iran OuchMuhammad A Arida, MD   Triage Vitals: BP 124/82  Pulse 82  Temp(Src) 97.9 F (36.6 C) (Oral)  Resp 20  Ht 5\' 4"  (1.626 m)  Wt 162 lb (73.483 kg)  BMI 27.79 kg/m2  SpO2 96% Physical Exam  Nursing note and vitals reviewed. Constitutional: She is oriented to person, place, and time. She appears well-developed and well-nourished. No distress.  HENT:  Head: Normocephalic and atraumatic.  Eyes: Conjunctivae and EOM are normal.  Neck: Neck supple. No tracheal deviation present.  Cardiovascular: Normal rate and normal heart sounds.   Pulmonary/Chest: Effort normal and breath sounds normal. No respiratory distress.  Musculoskeletal: Normal range of motion.  Neurological: She is alert and oriented to person, place, and time.  Skin: Skin is warm and dry.  Psychiatric: She has a normal mood and affect. Her behavior is normal.    ED Course  Procedures (including critical care time)  DIAGNOSTIC STUDIES: Oxygen Saturation is 96% on RA, normal by my interpretation.    COORDINATION OF CARE: 7:26 PM- Will give medications for pain management. Pt advised of plan for treatment and pt agrees.  Labs Review Labs Reviewed - No data to display  Imaging Review No results found.   EKG Interpretation None      MDM   Final diagnoses:  Cervical radiculopathy    58F here with R arm pain. Pain starts in neck, radiates down posterior upper arm and medial forearm. Tingling in 4th and 5th digits on  R hand. No relief with hand over head. No midline neck pain. No trauma or falls. Will treat with steroids, pain meds for cervical radiculopathy. Not sure why pain worsened with hand on top of head, usually alleviates radiculopathy pain, however pain classic radiculopathy pain, so will treat as such.   I personally performed the services described in this documentation, which was scribed in my presence. The recorded information has been reviewed and is accurate.     Elwin MochaBlair Donzel Romack, MD 05/12/14 2005

## 2014-05-12 NOTE — ED Notes (Signed)
C/o pain to entire right arm x 1 week-denies injury

## 2014-05-12 NOTE — ED Notes (Signed)
MD at bedside. 

## 2014-05-12 NOTE — Discharge Instructions (Signed)

## 2014-05-13 ENCOUNTER — Emergency Department (HOSPITAL_COMMUNITY): Payer: Self-pay

## 2014-05-13 ENCOUNTER — Emergency Department (HOSPITAL_COMMUNITY)
Admission: EM | Admit: 2014-05-13 | Discharge: 2014-05-13 | Disposition: A | Discharge status: Home / Self Care | Payer: Self-pay | Attending: Emergency Medicine | Admitting: Emergency Medicine

## 2014-05-13 ENCOUNTER — Encounter (HOSPITAL_COMMUNITY): Payer: Self-pay | Admitting: Emergency Medicine

## 2014-05-13 DIAGNOSIS — G40909 Epilepsy, unspecified, not intractable, without status epilepticus: Secondary | ICD-10-CM | POA: Insufficient documentation

## 2014-05-13 DIAGNOSIS — R079 Chest pain, unspecified: Secondary | ICD-10-CM | POA: Insufficient documentation

## 2014-05-13 DIAGNOSIS — E785 Hyperlipidemia, unspecified: Secondary | ICD-10-CM | POA: Insufficient documentation

## 2014-05-13 DIAGNOSIS — I4581 Long QT syndrome: Secondary | ICD-10-CM | POA: Insufficient documentation

## 2014-05-13 DIAGNOSIS — Z950 Presence of cardiac pacemaker: Secondary | ICD-10-CM | POA: Insufficient documentation

## 2014-05-13 DIAGNOSIS — IMO0002 Reserved for concepts with insufficient information to code with codable children: Secondary | ICD-10-CM | POA: Insufficient documentation

## 2014-05-13 DIAGNOSIS — Z8674 Personal history of sudden cardiac arrest: Secondary | ICD-10-CM | POA: Insufficient documentation

## 2014-05-13 DIAGNOSIS — I10 Essential (primary) hypertension: Secondary | ICD-10-CM | POA: Insufficient documentation

## 2014-05-13 DIAGNOSIS — J321 Chronic frontal sinusitis: Secondary | ICD-10-CM | POA: Insufficient documentation

## 2014-05-13 DIAGNOSIS — I4891 Unspecified atrial fibrillation: Secondary | ICD-10-CM | POA: Insufficient documentation

## 2014-05-13 DIAGNOSIS — M129 Arthropathy, unspecified: Secondary | ICD-10-CM | POA: Insufficient documentation

## 2014-05-13 DIAGNOSIS — Z95810 Presence of automatic (implantable) cardiac defibrillator: Secondary | ICD-10-CM | POA: Insufficient documentation

## 2014-05-13 DIAGNOSIS — Z79899 Other long term (current) drug therapy: Secondary | ICD-10-CM | POA: Insufficient documentation

## 2014-05-13 DIAGNOSIS — F17200 Nicotine dependence, unspecified, uncomplicated: Secondary | ICD-10-CM | POA: Insufficient documentation

## 2014-05-13 DIAGNOSIS — J4489 Other specified chronic obstructive pulmonary disease: Secondary | ICD-10-CM | POA: Insufficient documentation

## 2014-05-13 DIAGNOSIS — F3289 Other specified depressive episodes: Secondary | ICD-10-CM | POA: Insufficient documentation

## 2014-05-13 DIAGNOSIS — F329 Major depressive disorder, single episode, unspecified: Secondary | ICD-10-CM | POA: Insufficient documentation

## 2014-05-13 DIAGNOSIS — Z8673 Personal history of transient ischemic attack (TIA), and cerebral infarction without residual deficits: Secondary | ICD-10-CM | POA: Insufficient documentation

## 2014-05-13 DIAGNOSIS — I2510 Atherosclerotic heart disease of native coronary artery without angina pectoris: Secondary | ICD-10-CM | POA: Insufficient documentation

## 2014-05-13 DIAGNOSIS — F411 Generalized anxiety disorder: Secondary | ICD-10-CM | POA: Insufficient documentation

## 2014-05-13 DIAGNOSIS — J449 Chronic obstructive pulmonary disease, unspecified: Secondary | ICD-10-CM | POA: Insufficient documentation

## 2014-05-13 LAB — I-STAT TROPONIN, ED: Troponin i, poc: 0 ng/mL (ref 0.00–0.08)

## 2014-05-13 LAB — BASIC METABOLIC PANEL
Anion gap: 14 (ref 5–15)
BUN: 23 mg/dL (ref 6–23)
CALCIUM: 10 mg/dL (ref 8.4–10.5)
CO2: 25 mEq/L (ref 19–32)
CREATININE: 1.29 mg/dL — AB (ref 0.50–1.10)
Chloride: 100 mEq/L (ref 96–112)
GFR calc Af Amer: 50 mL/min — ABNORMAL LOW (ref >90)
GFR, EST NON AFRICAN AMERICAN: 43 mL/min — AB (ref >90)
GLUCOSE: 99 mg/dL (ref 70–99)
Potassium: 3.7 mEq/L (ref 3.7–5.3)
Sodium: 139 mEq/L (ref 137–147)

## 2014-05-13 LAB — CBC
HEMATOCRIT: 34.6 % — AB (ref 36.0–46.0)
Hemoglobin: 11.7 g/dL — ABNORMAL LOW (ref 12.0–15.0)
MCH: 30.9 pg (ref 26.0–34.0)
MCHC: 33.8 g/dL (ref 30.0–36.0)
MCV: 91.3 fL (ref 78.0–100.0)
PLATELETS: 260 10*3/uL (ref 150–400)
RBC: 3.79 MIL/uL — ABNORMAL LOW (ref 3.87–5.11)
RDW: 12.5 % (ref 11.5–15.5)
WBC: 11 10*3/uL — ABNORMAL HIGH (ref 4.0–10.5)

## 2014-05-13 MED ORDER — DIAZEPAM 2 MG PO TABS: 2.0000 mg | ORAL_TABLET | Freq: Two times a day (BID) | ORAL | Status: DC | PRN

## 2014-05-13 MED ORDER — DIAZEPAM 5 MG PO TABS: 5.0000 mg | ORAL_TABLET | Freq: Three times a day (TID) | ORAL | Status: DC | PRN

## 2014-05-13 MED ORDER — AMOXICILLIN-POT CLAVULANATE 875-125 MG PO TABS: 1.0000 | ORAL_TABLET | Freq: Two times a day (BID) | ORAL | Status: DC

## 2014-05-13 MED ORDER — HYDROCODONE-ACETAMINOPHEN 5-325 MG PO TABS
2.0000 | ORAL_TABLET | Freq: Once | ORAL | Status: AC
  Administered 2014-05-13: 2 via ORAL
  Filled 2014-05-13: qty 2

## 2014-05-13 MED ORDER — PREDNISONE 20 MG PO TABS
40.0000 mg | ORAL_TABLET | Freq: Once | ORAL | Status: AC
  Administered 2014-05-13: 40 mg via ORAL
  Filled 2014-05-13: qty 2

## 2014-05-13 NOTE — ED Notes (Signed)
Patient complains of no chest pain.

## 2014-05-13 NOTE — ED Provider Notes (Signed)
CSN: [id number]     Arrival date & time 05/13/14  1019 History   First MD Initiated Contact with Patient 05/13/14 1026   History provided by patient.   Chief Complaint  Patient presents with  . Chest Pain  . Headache   HPI  Patient presents with complaint of lower central chest pain acute onset at 0900 today, described as intermittent sharp stabbing non-radiating, associated with SOB, nausea (no vomiting), "feeling clammy" without diaphoresis. Stated episode lasted < 10 minutes total, currently asymptomatic and without chest pain. Called EMS, pain was relieved prior to their arrival, given ASA, brought to ED for further evaluation. Denies any recent fevers/chills, chest pressure or tightness, cough, inc sputum production, weakness, abdominal pain. Additionally, prior to onset of CP patient had been unable to sleep due to Right arm pain and recent ED visit, this AM she took Benadryl and Tylenol PM about 30 min prior to onset of symptoms. Also, endorses significant history of anxiety with intermittent acute spells with similar symptoms including CP, worsening over past 5 months (since Dec 26, 2013 following death of close friend).  Significant recent course with ED visit last night 05/12/14 at [facility] diagnosed with Right cervical radiculopathy pain with R-neck and R-shoulder pain radiating down Right arm with some tingling in 4th and 5th fingers, states this pain has been intermittent over past 1 week. Last night pain improved with dose of steroids and pain medicine, given rx but unable to fill them last night.  Additionally, complains of headache with frontal sinus pressure associated with nasal congestion, onset about 3 weeks ago, evaluated but has not received any antibiotics. No fevers. Admits to period of improvement, then gradual worsening over past 1-2 weeks.  Significant PMH with CAD, PAF on Xarelto anticoagulation, s/p ICD 2001 following cardiac arrest in ED with resuscitation, recent replaced  ICD 08/2013, COPD. Followed by Cardiology (Dr. Lorine Bears).  Past Medical History  Diagnosis Date  . Coronary artery disease     non obstructive  . Stroke   . Seizures   . Anxiety   . Depression   . Migraine   . Fibromuscular dysplasia   . Tobacco abuse   . COPD (chronic obstructive pulmonary disease)   . Arthritis of knee     Bilateral  . Tobacco abuse   . Migraine headache   . Anxiety disorder   . Depression   . Seizure disorder   . Cerebrovascular disease   . CAD (coronary artery disease)   . Automatic implantable cardiac defibrillator in situ     [employer] ICD  . HTN (hypertension)   . HLD (hyperlipidemia)   . Ventricular fibrillation   . Long QT syndrome   . Cardiac arrest   . Atrial fibrillation     a) 2D echo 09/11/12: LVEF 55-60%, mild LVH, mld LA dilatation  . Allergy    Past Surgical History  Procedure Laterality Date  . Pacemaker insertion  05/01/2003    ICD, implantation of a Guidant single chamber defibrillator, Doylene Canning. Ladona Ridgel MD  . Cholecystectomy    . Tubal ligation    . Breast lumpectomy    . Appendectomy    . Cosmetic surgery     Family History  Problem Relation Age of Onset  . Dementia Mother   . Stroke Mother     Multiple  . Aneurysm Sister     Brain  . Stroke Sister   . Uterine cancer Other     Grandmother  . Colon cancer  Sister   . Cancer Sister    History  Substance Use Topics  . Smoking status: Current Some Day Smoker -- 0.25 packs/day for 40 years    Types: Cigarettes  . Smokeless tobacco: Never Used     Comment: almost a pack a week  . Alcohol Use: No   OB History   Grav Para Term Preterm Abortions TAB SAB Ect Mult Living                 Review of Systems  See above HPI  Allergies  Azithromycin; Ibuprofen; Codeine; and Demerol  Home Medications   Prior to Admission medications   Medication Sig Start Date End Date Taking? Authorizing Provider  albuterol (PROVENTIL HFA;VENTOLIN HFA) 108 (90 BASE)  MCG/ACT inhaler Inhale 2 puffs into the lungs every 6 (six) hours as needed for wheezing.   Yes Historical Provider, MD  diazepam (VALIUM) 2 MG tablet Take 1 tablet (2 mg total) by mouth every 12 (twelve) hours as needed for anxiety. 04/25/14  Yes Phillips OdorJeffery Anderson, MD  diphenhydrAMINE (BENADRYL) 25 MG tablet Take 50 mg by mouth every 6 (six) hours as needed for sleep.   Yes Historical Provider, MD  diphenhydramine-acetaminophen (TYLENOL PM) 25-500 MG TABS Take 2 tablets by mouth at bedtime as needed (pain/sleep).   Yes Historical Provider, MD  HYDROcodone-acetaminophen (NORCO/VICODIN) 5-325 MG per tablet Take 1 tablet by mouth every 6 (six) hours as needed for moderate pain. 05/12/14  Yes Elwin MochaBlair Walden, MD  metoprolol tartrate (LOPRESSOR) 25 MG tablet Take 1 tablet (25 mg total) by mouth 2 (two) times daily. 11/29/13  Yes Iran OuchMuhammad A Arida, MD  mometasone (NASONEX) 50 MCG/ACT nasal spray Place 2 sprays into the nose daily.   Yes Historical Provider, MD  Rivaroxaban (XARELTO) 15 MG TABS tablet Take 1 tablet (15 mg total) by mouth daily with supper. 04/25/14  Yes Phillips OdorJeffery Anderson, MD  sertraline (ZOLOFT) 100 MG tablet Take 1.5 tablets (150 mg total) by mouth at bedtime. 04/25/14  Yes Phillips OdorJeffery Anderson, MD  simvastatin (ZOCOR) 20 MG tablet Take 1 tablet (20 mg total) by mouth at bedtime. 03/07/14  Yes Iran OuchMuhammad A Arida, MD  amoxicillin-clavulanate (AUGMENTIN) 875-125 MG per tablet Take 1 tablet by mouth 2 (two) times daily. For 7 days 05/13/14   Saralyn PilarAlexander Karamalegos, DO  diazepam (VALIUM) 5 MG tablet Take 1 tablet (5 mg total) by mouth every 8 (eight) hours as needed for anxiety (spasms). 05/13/14   Saralyn PilarAlexander Karamalegos, DO  predniSONE (DELTASONE) 20 MG tablet Take 2 tablets (40 mg total) by mouth daily. 05/13/14   Elwin MochaBlair Walden, MD   BP 131/63  Pulse 83  Temp(Src) 98.4 F (36.9 C) (Oral)  Resp 20  SpO2 100% Physical Exam  Gen - chronically ill-appearing, cooperative, NAD HEENT - NCAT, PERRL, EOMI, bilateral  frontal sinus tenderness, bilateral nasal turbinates edematous with minimal congestion, oropharynx clear, MMM Neck - supple, mild tenderness to palpation Right cervical paraspinal Heart - regular rhythm on exam, regular rate, no murmurs heard Chest Wall - mild reproducible tenderness lower central sternum Lungs - Mostly CTAB, scattered bibasilar rhonchi clears with cough. No wheezing or focal crackles. Normal work of breathing, speaks full sentences. Abd - soft, NTND, no masses, +active BS Ext - non-tender, no edema, peripheral pulses intact +2 b/l Skin - warm, dry, no rashes Neuro - awake, alert, oriented, grossly non-focal, intact muscle strength 5/5 b/l, intact distal sensation to light touch  ED Course  Procedures (including critical care time) Labs Review Labs  Reviewed  BASIC METABOLIC PANEL - Abnormal; Notable for the following:    Creatinine, Ser 1.29 (*)    GFR calc non Af Amer 43 (*)    GFR calc Af Amer 50 (*)    All other components within normal limits  CBC - Abnormal; Notable for the following:    WBC 11.0 (*)    RBC 3.79 (*)    Hemoglobin 11.7 (*)    HCT 34.6 (*)    All other components within normal limits  Rosezena Sensor, ED    Imaging Review Dg Chest 2 View  05/13/2014   CLINICAL DATA:  Chest pain.  EXAM: CHEST  2 VIEW  COMPARISON:  March 25, 2014.  FINDINGS: The heart size and mediastinal contours are within normal limits. Left-sided pacemaker is noted and unchanged in position. No pneumothorax or pleural effusion is noted. Both lungs are clear. The visualized skeletal structures are unremarkable.  IMPRESSION: No acute cardiopulmonary abnormality seen.   Electronically Signed   By: Roque Lias M.D.   On: 05/13/2014 12:56     EKG Interpretation None      MDM   Final diagnoses:  Chest pain, unspecified chest pain type  Chronic frontal sinusitis   34 yr F with PMH CAD, PAF (s/p ICD, on anticoagulation with Xarelto, rate control), COPD, arthritis, presents  with acute onset central lower sternal CP, onset 0900, duration < 10 mins, sharp non-radiating, associated with some nausea, SOB. Non-exertional. Arrival to ED via EMS, on evaluation patient without chest pain or associated symptoms, chronically ill but well appearing, cooperative, vitals stable, NSR on exam and EKG (no acute ST findings, non-specific unchanged T wave changes from last EKG 12/2013). Suspected atypical chest pain, in setting of likely anxiety, concern with high risk patient multiple risk factors known CAD, COPD. Proceed with work-up.  Additionally, with c/o persistent but improved Right neck/shoulder and R-arm pain (evaluated in ED last night, suspected radiculopathy, improved with Norco and steroids), also complains of gradual worsening frontal headache +sinus pressure and congestion (overall course 3 weeks, no abx, improved then worsened). Suspect unresolved acute sinusitis  Work-up with BMET, CBC, I-stat-Troponin, CXR. Consider contacting primary Cardiology office, patient has scheduled close follow-up already with Cards apt on 05/16/14.  UPDATE @ 1240 - Patient with improved Right shoulder and arm pain following Norco and Prednisone, with some persistent Headache. Denies any new CP or nausea. Reviewed results with unremarkable BMET (stable electrolytes, Cr at b/l 1.29), CBC with very mildly elevated WBC 11.0, low Hgb 11.7. I-stat Trop (0.00, negative), CXR (no acute findings).  Discharge to home with reassurance, short course Valium for anxiety PRN, Augmentin x 7 day rx for sinusitis, already has existing rx for Norco and Prednisone for Right neck pain (from ED last night), advised close follow-up with primary doctor, already scheduled close f/u with Cardiology on 05/16/14. Return precautions given.  Saralyn Pilar, DO 05/13/14 1444

## 2014-05-13 NOTE — ED Notes (Signed)
Phlebotomy at the bedside  

## 2014-05-13 NOTE — ED Notes (Signed)
Patient returned from X-ray 

## 2014-05-13 NOTE — Discharge Instructions (Signed)
Your tests did not show any cause of your chest pain. Lab work was normal. Heart tests were normal as well. X-ray did not find any problems.  Take Augmentin antibiotic twice daily for 7 days to help clear up your Sinusitis.  Given prescription for Valium, take as needed for anxiety. Make sure that you fill your current prescriptions (received last night, for Norco and Prednisone) take these as prescribed.  Follow-up with your Cardiologist on Tuesday 8/25, also make sure that you schedule a close follow-up appointment with your regular doctor within 1 week.  If you develop significant worsening symptoms with return of severe chest pain, nausea / vomiting, shortness of breath, please call your doctor, otherwise return to the Emergency Department for further evaluation.  Chest Pain (Nonspecific) It is often hard to give a specific diagnosis for the cause of chest pain. There is always a chance that your pain could be related to something serious, such as a heart attack or a blood clot in the lungs. You need to follow up with your health care provider for further evaluation. CAUSES   Heartburn.  Pneumonia or bronchitis.  Anxiety or stress.  Inflammation around your heart (pericarditis) or lung (pleuritis or pleurisy).  A blood clot in the lung.  A collapsed lung (pneumothorax). It can develop suddenly on its own (spontaneous pneumothorax) or from trauma to the chest.  Shingles infection (herpes zoster virus). The chest wall is composed of bones, muscles, and cartilage. Any of these can be the source of the pain.  The bones can be bruised by injury.  The muscles or cartilage can be strained by coughing or overwork.  The cartilage can be affected by inflammation and become sore (costochondritis). DIAGNOSIS  Lab tests or other studies may be needed to find the cause of your pain. Your health care provider may have you take a test called an ambulatory electrocardiogram (ECG). An ECG  records your heartbeat patterns over a 24-hour period. You may also have other tests, such as:  Transthoracic echocardiogram (TTE). During echocardiography, sound waves are used to evaluate how blood flows through your heart.  Transesophageal echocardiogram (TEE).  Cardiac monitoring. This allows your health care provider to monitor your heart rate and rhythm in real time.  Holter monitor. This is a portable device that records your heartbeat and can help diagnose heart arrhythmias. It allows your health care provider to track your heart activity for several days, if needed.  Stress tests by exercise or by giving medicine that makes the heart beat faster. TREATMENT   Treatment depends on what may be causing your chest pain. Treatment may include:  Acid blockers for heartburn.  Anti-inflammatory medicine.  Pain medicine for inflammatory conditions.  Antibiotics if an infection is present.  You may be advised to change lifestyle habits. This includes stopping smoking and avoiding alcohol, caffeine, and chocolate.  You may be advised to keep your head raised (elevated) when sleeping. This reduces the chance of acid going backward from your stomach into your esophagus. Most of the time, nonspecific chest pain will improve within 2-3 days with rest and mild pain medicine.  HOME CARE INSTRUCTIONS   If antibiotics were prescribed, take them as directed. Finish them even if you start to feel better.  For the next few days, avoid physical activities that bring on chest pain. Continue physical activities as directed.  Do not use any tobacco products, including cigarettes, chewing tobacco, or electronic cigarettes.  Avoid drinking alcohol.  Only take medicine  as directed by your health care provider.  Follow your health care provider's suggestions for further testing if your chest pain does not go away.  Keep any follow-up appointments you made. If you do not go to an appointment, you  could develop lasting (chronic) problems with pain. If there is any problem keeping an appointment, call to reschedule. SEEK MEDICAL CARE IF:   Your chest pain does not go away, even after treatment.  You have a rash with blisters on your chest.  You have a fever. SEEK IMMEDIATE MEDICAL CARE IF:   You have increased chest pain or pain that spreads to your arm, neck, jaw, back, or abdomen.  You have shortness of breath.  You have an increasing cough, or you cough up blood.  You have severe back or abdominal pain.  You feel nauseous or vomit.  You have severe weakness.  You faint.  You have chills. This is an emergency. Do not wait to see if the pain will go away. Get medical help at once. Call your local emergency services (911 in [place] Do not drive yourself to the hospital. MAKE SURE YOU:   Understand these instructions.  Will watch your condition.  Will get help right away if you are not doing well or get worse. Document Released: 06/18/2005 Document Revised: 09/13/2013 Document Reviewed: 04/13/2008 [facility] Patient Information 2015 [place], [place]. This information is not intended to replace advice given to you by your health care provider. Make sure you discuss any questions you have with your health care provider.

## 2014-05-13 NOTE — ED Notes (Signed)
Pt presents with CP, H/A & SHOB. [facility] for pinched nerve in Right shoulder. Pt states that she was attempting to go to bed at 08:30am when CP started. L sternal not radiation. CP self-resolved. H/A remains. Pt reports N no v no changes in vision pt reports similar h/a in past. Pt was given 324 asa

## 2014-05-14 NOTE — ED Provider Notes (Signed)
I saw and evaluated the patient, reviewed the resident's note and I agree with the findings and plan.   .Face to face Exam:  General:  Awake HEENT:  Atraumatic Resp:  Normal effort Abd:  Nondistended Neuro:No focal weakness Lymph: No adenopathy  ekg reviewed and discussed with resident  Nelia Shi, MD 05/14/14 2156

## 2014-05-16 ENCOUNTER — Ambulatory Visit: Payer: Self-pay | Admitting: Cardiovascular Disease

## 2014-05-24 ENCOUNTER — Encounter: Payer: Self-pay | Admitting: *Deleted

## 2014-05-25 ENCOUNTER — Encounter (HOSPITAL_COMMUNITY): Payer: Self-pay | Admitting: Emergency Medicine

## 2014-05-25 ENCOUNTER — Emergency Department (HOSPITAL_COMMUNITY)
Admission: EM | Admit: 2014-05-25 | Discharge: 2014-05-25 | Disposition: A | Discharge status: Home / Self Care | Payer: Self-pay | Attending: Emergency Medicine | Admitting: Emergency Medicine

## 2014-05-25 DIAGNOSIS — M1710 Unilateral primary osteoarthritis, unspecified knee: Secondary | ICD-10-CM | POA: Insufficient documentation

## 2014-05-25 DIAGNOSIS — F329 Major depressive disorder, single episode, unspecified: Secondary | ICD-10-CM | POA: Insufficient documentation

## 2014-05-25 DIAGNOSIS — F17200 Nicotine dependence, unspecified, uncomplicated: Secondary | ICD-10-CM | POA: Insufficient documentation

## 2014-05-25 DIAGNOSIS — Z8674 Personal history of sudden cardiac arrest: Secondary | ICD-10-CM | POA: Insufficient documentation

## 2014-05-25 DIAGNOSIS — Z950 Presence of cardiac pacemaker: Secondary | ICD-10-CM | POA: Insufficient documentation

## 2014-05-25 DIAGNOSIS — F3289 Other specified depressive episodes: Secondary | ICD-10-CM | POA: Insufficient documentation

## 2014-05-25 DIAGNOSIS — E785 Hyperlipidemia, unspecified: Secondary | ICD-10-CM | POA: Insufficient documentation

## 2014-05-25 DIAGNOSIS — Z8673 Personal history of transient ischemic attack (TIA), and cerebral infarction without residual deficits: Secondary | ICD-10-CM | POA: Insufficient documentation

## 2014-05-25 DIAGNOSIS — J4489 Other specified chronic obstructive pulmonary disease: Secondary | ICD-10-CM | POA: Insufficient documentation

## 2014-05-25 DIAGNOSIS — Z95810 Presence of automatic (implantable) cardiac defibrillator: Secondary | ICD-10-CM | POA: Insufficient documentation

## 2014-05-25 DIAGNOSIS — G43909 Migraine, unspecified, not intractable, without status migrainosus: Secondary | ICD-10-CM | POA: Insufficient documentation

## 2014-05-25 DIAGNOSIS — I10 Essential (primary) hypertension: Secondary | ICD-10-CM | POA: Insufficient documentation

## 2014-05-25 DIAGNOSIS — G40909 Epilepsy, unspecified, not intractable, without status epilepticus: Secondary | ICD-10-CM | POA: Insufficient documentation

## 2014-05-25 DIAGNOSIS — F411 Generalized anxiety disorder: Secondary | ICD-10-CM | POA: Insufficient documentation

## 2014-05-25 DIAGNOSIS — Z792 Long term (current) use of antibiotics: Secondary | ICD-10-CM | POA: Insufficient documentation

## 2014-05-25 DIAGNOSIS — IMO0002 Reserved for concepts with insufficient information to code with codable children: Secondary | ICD-10-CM | POA: Insufficient documentation

## 2014-05-25 DIAGNOSIS — I4891 Unspecified atrial fibrillation: Secondary | ICD-10-CM | POA: Insufficient documentation

## 2014-05-25 DIAGNOSIS — F419 Anxiety disorder, unspecified: Secondary | ICD-10-CM

## 2014-05-25 DIAGNOSIS — Z7901 Long term (current) use of anticoagulants: Secondary | ICD-10-CM | POA: Insufficient documentation

## 2014-05-25 DIAGNOSIS — J449 Chronic obstructive pulmonary disease, unspecified: Secondary | ICD-10-CM | POA: Insufficient documentation

## 2014-05-25 DIAGNOSIS — Z79899 Other long term (current) drug therapy: Secondary | ICD-10-CM | POA: Insufficient documentation

## 2014-05-25 DIAGNOSIS — I2510 Atherosclerotic heart disease of native coronary artery without angina pectoris: Secondary | ICD-10-CM | POA: Insufficient documentation

## 2014-05-25 LAB — CBC WITH DIFFERENTIAL/PLATELET
Basophils Absolute: 0.1 10*3/uL (ref 0.0–0.1)
Basophils Relative: 1 % (ref 0–1)
EOS ABS: 0.3 10*3/uL (ref 0.0–0.7)
Eosinophils Relative: 3 % (ref 0–5)
HCT: 35.9 % — ABNORMAL LOW (ref 36.0–46.0)
Hemoglobin: 12.3 g/dL (ref 12.0–15.0)
LYMPHS ABS: 2.2 10*3/uL (ref 0.7–4.0)
Lymphocytes Relative: 25 % (ref 12–46)
MCH: 31.6 pg (ref 26.0–34.0)
MCHC: 34.3 g/dL (ref 30.0–36.0)
MCV: 92.3 fL (ref 78.0–100.0)
Monocytes Absolute: 0.7 10*3/uL (ref 0.1–1.0)
Monocytes Relative: 8 % (ref 3–12)
Neutro Abs: 5.5 10*3/uL (ref 1.7–7.7)
Neutrophils Relative %: 63 % (ref 43–77)
PLATELETS: 258 10*3/uL (ref 150–400)
RBC: 3.89 MIL/uL (ref 3.87–5.11)
RDW: 12.8 % (ref 11.5–15.5)
WBC: 8.6 10*3/uL (ref 4.0–10.5)

## 2014-05-25 LAB — COMPREHENSIVE METABOLIC PANEL
ALBUMIN: 3.4 g/dL — AB (ref 3.5–5.2)
ALK PHOS: 87 U/L (ref 39–117)
ALT: 11 U/L (ref 0–35)
AST: 14 U/L (ref 0–37)
Anion gap: 13 (ref 5–15)
BUN: 17 mg/dL (ref 6–23)
CALCIUM: 9.5 mg/dL (ref 8.4–10.5)
CO2: 26 mEq/L (ref 19–32)
Chloride: 105 mEq/L (ref 96–112)
Creatinine, Ser: 1.27 mg/dL — ABNORMAL HIGH (ref 0.50–1.10)
GFR calc non Af Amer: 44 mL/min — ABNORMAL LOW (ref >90)
GFR, EST AFRICAN AMERICAN: 51 mL/min — AB (ref >90)
GLUCOSE: 89 mg/dL (ref 70–99)
Potassium: 3.3 mEq/L — ABNORMAL LOW (ref 3.7–5.3)
SODIUM: 144 meq/L (ref 137–147)
TOTAL PROTEIN: 6.4 g/dL (ref 6.0–8.3)
Total Bilirubin: <0.2 mg/dL — ABNORMAL LOW (ref 0.3–1.2)

## 2014-05-25 LAB — TROPONIN I

## 2014-05-25 LAB — LIPASE, BLOOD: Lipase: 30 U/L (ref 11–59)

## 2014-05-25 MED ORDER — LORAZEPAM 1 MG PO TABS: 1.0000 mg | ORAL_TABLET | Freq: Four times a day (QID) | ORAL | Status: DC | PRN

## 2014-05-25 MED ORDER — LORAZEPAM 1 MG PO TABS
1.0000 mg | ORAL_TABLET | Freq: Once | ORAL | Status: AC
  Administered 2014-05-25: 1 mg via ORAL
  Filled 2014-05-25: qty 1

## 2014-05-25 NOTE — ED Notes (Signed)
Patient in NAD at time of d/c, ambulated without assistance to lobby

## 2014-05-25 NOTE — Discharge Instructions (Signed)
Ativan as prescribed as needed for anxiety.  Return to the emergency department if you develop any new or worsening symptoms.   Panic Attacks Panic attacks are sudden, short-livedsurges of severe anxiety, fear, or discomfort. They may occur for no reason when you are relaxed, when you are anxious, or when you are sleeping. Panic attacks may occur for a number of reasons:   Healthy people occasionally have panic attacks in extreme, life-threatening situations, such as war or natural disasters. Normal anxiety is a protective mechanism of the body that helps [place] react to danger (fight or flight response).  Panic attacks are often seen with anxiety disorders, such as panic disorder, social anxiety disorder, generalized anxiety disorder, and phobias. Anxiety disorders cause excessive or uncontrollable anxiety. They may interfere with your relationships or other life activities.  Panic attacks are sometimes seen with other mental illnesses, such as depression and posttraumatic stress disorder.  Certain medical conditions, prescription medicines, and drugs of abuse can cause panic attacks. SYMPTOMS  Panic attacks start suddenly, peak within 20 minutes, and are accompanied by four or more of the following symptoms:  Pounding heart or fast heart rate (palpitations).  Sweating.  Trembling or shaking.  Shortness of breath or feeling smothered.  Feeling choked.  Chest pain or discomfort.  Nausea or strange feeling in your stomach.  Dizziness, light-headedness, or feeling like you will faint.  Chills or hot flushes.  Numbness or tingling in your lips or hands and feet.  Feeling that things are not real or feeling that you are not yourself.  Fear of losing control or going crazy.  Fear of dying. Some of these symptoms can mimic serious medical conditions. For example, you may think you are having a heart attack. Although panic attacks can be very scary, they are not life  threatening. DIAGNOSIS  Panic attacks are diagnosed through an assessment by your health care provider. Your health care provider will ask questions about your symptoms, such as where and when they occurred. Your health care provider will also ask about your medical history and use of alcohol and drugs, including prescription medicines. Your health care provider may order blood tests or other studies to rule out a serious medical condition. Your health care provider may refer you to a mental health professional for further evaluation. TREATMENT   Most healthy people who have one or two panic attacks in an extreme, life-threatening situation will not require treatment.  The treatment for panic attacks associated with anxiety disorders or other mental illness typically involves counseling with a mental health professional, medicine, or a combination of both. Your health care provider will help determine what treatment is best for you.  Panic attacks due to physical illness usually go away with treatment of the illness. If prescription medicine is causing panic attacks, talk with your health care provider about stopping the medicine, decreasing the dose, or substituting another medicine.  Panic attacks due to alcohol or drug abuse go away with abstinence. Some adults need professional help in order to stop drinking or using drugs. HOME CARE INSTRUCTIONS   Take all medicines as directed by your health care provider.   Schedule and attend follow-up visits as directed by your health care provider. It is important to keep all your appointments. SEEK MEDICAL CARE IF:  You are not able to take your medicines as prescribed.  Your symptoms do not improve or get worse. SEEK IMMEDIATE MEDICAL CARE IF:   You experience panic attack symptoms that are different  than your usual symptoms.  You have serious thoughts about hurting yourself or others.  You are taking medicine for panic attacks and have a  serious side effect. MAKE SURE YOU:  Understand these instructions.  Will watch your condition.  Will get help right away if you are not doing well or get worse. Document Released: 09/08/2005 Document Revised: 09/13/2013 Document Reviewed: 04/22/2013 [facility] Patient Information 2015 [place], [place]. This information is not intended to replace advice given to you by your health care provider. Make sure you discuss any questions you have with your health care provider.

## 2014-05-25 NOTE — ED Notes (Signed)
Patient states ongoing anxiety and she feels as if she is developing phobias to everything, patient also states she feels heart fluttering due to anxiety, patient states she is not eating or drinking, patient states appointment with psychiatrist sept 23

## 2014-05-25 NOTE — ED Provider Notes (Signed)
CSN: [id number]     Arrival date & time 05/25/14  1843 History   First MD Initiated Contact with Patient 05/25/14 1847     Chief Complaint  Patient presents with  . Anxiety     (Consider location/radiation/quality/duration/timing/severity/associated sxs/prior Treatment) HPI Comments: Patient is a 63 year old female with history of atrial fibrillation, COPD, AICD. She presents with multiple complaints. She states that she has been having anxiety attacks recently and reports multiple "phobias". She began feeling more anxious this evening and presents for evaluation of this. She feels tight in the epigastrium and chest but denies nausea, diaphoresis, or difficulty breathing.  Patient is a 63 y.o. female presenting with anxiety. The history is provided by the patient.  Anxiety This is a new problem. The problem occurs constantly. The problem has not changed since onset.Associated symptoms include chest pain and abdominal pain. Nothing aggravates the symptoms. Nothing relieves the symptoms.    Past Medical History  Diagnosis Date  . Coronary artery disease     non obstructive  . Stroke   . Seizures   . Anxiety   . Depression   . Migraine   . Fibromuscular dysplasia   . Tobacco abuse   . COPD (chronic obstructive pulmonary disease)   . Arthritis of knee     Bilateral  . Tobacco abuse   . Migraine headache   . Anxiety disorder   . Depression   . Seizure disorder   . Cerebrovascular disease   . CAD (coronary artery disease)   . Automatic implantable cardiac defibrillator in situ     [employer] ICD  . HTN (hypertension)   . HLD (hyperlipidemia)   . Ventricular fibrillation   . Long QT syndrome   . Cardiac arrest   . Atrial fibrillation     a) 2D echo 09/11/12: LVEF 55-60%, mild LVH, mld LA dilatation  . Allergy    Past Surgical History  Procedure Laterality Date  . Pacemaker insertion  05/01/2003    ICD, implantation of a Guidant single chamber defibrillator, Doylene Canning.  Ladona Ridgel MD  . Cholecystectomy    . Tubal ligation    . Breast lumpectomy    . Appendectomy    . Cosmetic surgery     Family History  Problem Relation Age of Onset  . Dementia Mother   . Stroke Mother     Multiple  . Aneurysm Sister     Brain  . Stroke Sister   . Uterine cancer Other     Grandmother  . Colon cancer Sister   . Cancer Sister    History  Substance Use Topics  . Smoking status: Current Some Day Smoker -- 0.25 packs/day for 40 years    Types: Cigarettes  . Smokeless tobacco: Never Used     Comment: almost a pack a week  . Alcohol Use: No   OB History   Grav Para Term Preterm Abortions TAB SAB Ect Mult Living                 Review of Systems  Cardiovascular: Positive for chest pain.  Gastrointestinal: Positive for abdominal pain.  All other systems reviewed and are negative.     Allergies  Azithromycin; Ibuprofen; Codeine; and Demerol  Home Medications   Prior to Admission medications   Medication Sig Start Date End Date Taking? Authorizing Provider  albuterol (PROVENTIL HFA;VENTOLIN HFA) 108 (90 BASE) MCG/ACT inhaler Inhale 2 puffs into the lungs every 6 (six) hours as needed for wheezing.  Historical Provider, MD  amoxicillin-clavulanate (AUGMENTIN) 875-125 MG per tablet Take 1 tablet by mouth 2 (two) times daily. For 7 days 05/13/14   Saralyn Pilar, DO  diazepam (VALIUM) 2 MG tablet Take 1 tablet (2 mg total) by mouth every 12 (twelve) hours as needed for anxiety. 04/25/14   Carmelina Dane, MD  diazepam (VALIUM) 5 MG tablet Take 1 tablet (5 mg total) by mouth every 8 (eight) hours as needed for anxiety (spasms). 05/13/14   Saralyn Pilar, DO  diphenhydrAMINE (BENADRYL) 25 MG tablet Take 50 mg by mouth every 6 (six) hours as needed for sleep.    Historical Provider, MD  diphenhydramine-acetaminophen (TYLENOL PM) 25-500 MG TABS Take 2 tablets by mouth at bedtime as needed (pain/sleep).    Historical Provider, MD   HYDROcodone-acetaminophen (NORCO/VICODIN) 5-325 MG per tablet Take 1 tablet by mouth every 6 (six) hours as needed for moderate pain. 05/12/14   Elwin Mocha, MD  metoprolol tartrate (LOPRESSOR) 25 MG tablet Take 1 tablet (25 mg total) by mouth 2 (two) times daily. 11/29/13   Iran Ouch, MD  mometasone (NASONEX) 50 MCG/ACT nasal spray Place 2 sprays into the nose daily.    Historical Provider, MD  predniSONE (DELTASONE) 20 MG tablet Take 2 tablets (40 mg total) by mouth daily. 05/13/14   Elwin Mocha, MD  Rivaroxaban (XARELTO) 15 MG TABS tablet Take 1 tablet (15 mg total) by mouth daily with supper. 04/25/14   Carmelina Dane, MD  sertraline (ZOLOFT) 100 MG tablet Take 1.5 tablets (150 mg total) by mouth at bedtime. 04/25/14   Carmelina Dane, MD  simvastatin (ZOCOR) 20 MG tablet Take 1 tablet (20 mg total) by mouth at bedtime. 03/07/14   Iran Ouch, MD   BP 125/84  Pulse 76  Temp(Src) 98.4 F (36.9 C) (Oral)  SpO2 99% Physical Exam  Nursing note and vitals reviewed. Constitutional: She is oriented to person, place, and time. She appears well-developed and well-nourished. No distress.  HENT:  Head: Normocephalic and atraumatic.  Neck: Normal range of motion. Neck supple.  Cardiovascular: Normal rate and regular rhythm.  Exam reveals no gallop and no friction rub.   No murmur heard. Pulmonary/Chest: Effort normal and breath sounds normal. No respiratory distress. She has no wheezes.  Abdominal: Soft. Bowel sounds are normal. She exhibits no distension. There is no tenderness.  Musculoskeletal: Normal range of motion.  Neurological: She is alert and oriented to person, place, and time.  Skin: Skin is warm and dry. She is not diaphoretic.  Psychiatric: Her behavior is normal. Judgment and thought content normal. Her mood appears anxious. Her speech is rapid and/or pressured. Cognition and memory are normal.    ED Course  Procedures (including critical care time) Labs  Review Labs Reviewed  CBC WITH DIFFERENTIAL  COMPREHENSIVE METABOLIC PANEL  LIPASE, BLOOD  TROPONIN I    Imaging Review No results found.   EKG Interpretation   Date/Time:  Thursday May 25 2014 19:37:33 EDT Ventricular Rate:  68 PR Interval:  253 QRS Duration: 99 QT Interval:  436 QTC Calculation: 464 R Axis:   47 Text Interpretation:  Sinus rhythm Prolonged PR interval Borderline T  abnormalities, anterior leads Confirmed by DELOS  MD, Sevan Mcbroom ([id number]) on  05/25/2014 9:24:55 PM      MDM   Final diagnoses:  None    Patient is a 63 year old female who presents with multiple complaints that sound like anxiety-related issues. Laboratory studies and EKG are unremarkable. She is  feeling better with Ativan and I feel as though she is appropriate for discharge.    Geoffery Lyons, MD 05/25/14 2125

## 2014-05-26 ENCOUNTER — Telehealth (HOSPITAL_BASED_OUTPATIENT_CLINIC_OR_DEPARTMENT_OTHER): Payer: Self-pay | Admitting: Emergency Medicine

## 2014-05-31 ENCOUNTER — Emergency Department (HOSPITAL_COMMUNITY)
Admission: EM | Admit: 2014-05-31 | Discharge: 2014-05-31 | Disposition: A | Discharge status: Home / Self Care | Payer: Self-pay | Attending: Emergency Medicine | Admitting: Emergency Medicine

## 2014-05-31 ENCOUNTER — Encounter (HOSPITAL_COMMUNITY): Payer: Self-pay | Admitting: Emergency Medicine

## 2014-05-31 DIAGNOSIS — J449 Chronic obstructive pulmonary disease, unspecified: Secondary | ICD-10-CM | POA: Insufficient documentation

## 2014-05-31 DIAGNOSIS — F3289 Other specified depressive episodes: Secondary | ICD-10-CM | POA: Insufficient documentation

## 2014-05-31 DIAGNOSIS — F17200 Nicotine dependence, unspecified, uncomplicated: Secondary | ICD-10-CM | POA: Insufficient documentation

## 2014-05-31 DIAGNOSIS — R197 Diarrhea, unspecified: Secondary | ICD-10-CM | POA: Insufficient documentation

## 2014-05-31 DIAGNOSIS — IMO0002 Reserved for concepts with insufficient information to code with codable children: Secondary | ICD-10-CM

## 2014-05-31 DIAGNOSIS — Z8673 Personal history of transient ischemic attack (TIA), and cerebral infarction without residual deficits: Secondary | ICD-10-CM | POA: Insufficient documentation

## 2014-05-31 DIAGNOSIS — Z79899 Other long term (current) drug therapy: Secondary | ICD-10-CM | POA: Insufficient documentation

## 2014-05-31 DIAGNOSIS — F329 Major depressive disorder, single episode, unspecified: Secondary | ICD-10-CM | POA: Insufficient documentation

## 2014-05-31 DIAGNOSIS — I2510 Atherosclerotic heart disease of native coronary artery without angina pectoris: Secondary | ICD-10-CM | POA: Insufficient documentation

## 2014-05-31 DIAGNOSIS — M1710 Unilateral primary osteoarthritis, unspecified knee: Secondary | ICD-10-CM | POA: Insufficient documentation

## 2014-05-31 DIAGNOSIS — I4891 Unspecified atrial fibrillation: Secondary | ICD-10-CM | POA: Insufficient documentation

## 2014-05-31 DIAGNOSIS — R42 Dizziness and giddiness: Secondary | ICD-10-CM | POA: Insufficient documentation

## 2014-05-31 DIAGNOSIS — Z950 Presence of cardiac pacemaker: Secondary | ICD-10-CM | POA: Insufficient documentation

## 2014-05-31 DIAGNOSIS — R112 Nausea with vomiting, unspecified: Secondary | ICD-10-CM | POA: Insufficient documentation

## 2014-05-31 DIAGNOSIS — I10 Essential (primary) hypertension: Secondary | ICD-10-CM | POA: Insufficient documentation

## 2014-05-31 DIAGNOSIS — J4489 Other specified chronic obstructive pulmonary disease: Secondary | ICD-10-CM | POA: Insufficient documentation

## 2014-05-31 DIAGNOSIS — Z95810 Presence of automatic (implantable) cardiac defibrillator: Secondary | ICD-10-CM | POA: Insufficient documentation

## 2014-05-31 DIAGNOSIS — G40909 Epilepsy, unspecified, not intractable, without status epilepticus: Secondary | ICD-10-CM | POA: Insufficient documentation

## 2014-05-31 DIAGNOSIS — Z7901 Long term (current) use of anticoagulants: Secondary | ICD-10-CM | POA: Insufficient documentation

## 2014-05-31 DIAGNOSIS — F411 Generalized anxiety disorder: Secondary | ICD-10-CM | POA: Insufficient documentation

## 2014-05-31 DIAGNOSIS — Z8674 Personal history of sudden cardiac arrest: Secondary | ICD-10-CM | POA: Insufficient documentation

## 2014-05-31 DIAGNOSIS — E785 Hyperlipidemia, unspecified: Secondary | ICD-10-CM | POA: Insufficient documentation

## 2014-05-31 DIAGNOSIS — Z8739 Personal history of other diseases of the musculoskeletal system and connective tissue: Secondary | ICD-10-CM | POA: Insufficient documentation

## 2014-05-31 DIAGNOSIS — R110 Nausea: Secondary | ICD-10-CM

## 2014-05-31 DIAGNOSIS — E876 Hypokalemia: Secondary | ICD-10-CM | POA: Insufficient documentation

## 2014-05-31 DIAGNOSIS — F419 Anxiety disorder, unspecified: Secondary | ICD-10-CM

## 2014-05-31 LAB — BASIC METABOLIC PANEL
Anion gap: 14 (ref 5–15)
BUN: 25 mg/dL — AB (ref 6–23)
CHLORIDE: 101 meq/L (ref 96–112)
CO2: 24 meq/L (ref 19–32)
Calcium: 9.9 mg/dL (ref 8.4–10.5)
Creatinine, Ser: 1.23 mg/dL — ABNORMAL HIGH (ref 0.50–1.10)
GFR calc non Af Amer: 45 mL/min — ABNORMAL LOW (ref >90)
GFR, EST AFRICAN AMERICAN: 53 mL/min — AB (ref >90)
Glucose, Bld: 113 mg/dL — ABNORMAL HIGH (ref 70–99)
POTASSIUM: 3 meq/L — AB (ref 3.7–5.3)
Sodium: 139 mEq/L (ref 137–147)

## 2014-05-31 LAB — CBC
HCT: 37.5 % (ref 36.0–46.0)
Hemoglobin: 12.9 g/dL (ref 12.0–15.0)
MCH: 31.4 pg (ref 26.0–34.0)
MCHC: 34.4 g/dL (ref 30.0–36.0)
MCV: 91.2 fL (ref 78.0–100.0)
Platelets: 226 10*3/uL (ref 150–400)
RBC: 4.11 MIL/uL (ref 3.87–5.11)
RDW: 12.5 % (ref 11.5–15.5)
WBC: 8 10*3/uL (ref 4.0–10.5)

## 2014-05-31 LAB — CBG MONITORING, ED: Glucose-Capillary: 123 mg/dL — ABNORMAL HIGH (ref 70–99)

## 2014-05-31 MED ORDER — POTASSIUM CHLORIDE CRYS ER 20 MEQ PO TBCR: 20.0000 meq | EXTENDED_RELEASE_TABLET | Freq: Every day | ORAL | Status: DC

## 2014-05-31 MED ORDER — ONDANSETRON 4 MG PO TBDP: ORAL_TABLET | ORAL | Status: DC

## 2014-05-31 MED ORDER — ONDANSETRON 4 MG PO TBDP
4.0000 mg | ORAL_TABLET | Freq: Once | ORAL | Status: AC
  Administered 2014-05-31: 4 mg via ORAL
  Filled 2014-05-31: qty 1

## 2014-05-31 MED ORDER — SODIUM CHLORIDE 0.9 % IV BOLUS (SEPSIS)
1000.0000 mL | Freq: Once | INTRAVENOUS | Status: AC
Start: 2014-05-31 — End: 2014-05-31
  Administered 2014-05-31: 1000 mL via INTRAVENOUS

## 2014-05-31 MED ORDER — POTASSIUM CHLORIDE CRYS ER 20 MEQ PO TBCR
40.0000 meq | EXTENDED_RELEASE_TABLET | Freq: Once | ORAL | Status: AC
  Administered 2014-05-31: 40 meq via ORAL
  Filled 2014-05-31: qty 2

## 2014-05-31 MED ORDER — ZOLPIDEM TARTRATE 5 MG PO TABS: 5.0000 mg | ORAL_TABLET | Freq: Every evening | ORAL | Status: DC | PRN

## 2014-05-31 NOTE — ED Notes (Signed)
Per ems pt complains of neck pain that radiated to her shoulder and arm pain

## 2014-05-31 NOTE — ED Notes (Signed)
Pt doesn't have a ride home until about 8am, pt sleeping in room

## 2014-05-31 NOTE — Progress Notes (Signed)
[facility] Community Liaison Stacy,  ° °Provided pt with a list of primary care resources and a [facility] card application to help patient establish primary care.  °

## 2014-05-31 NOTE — ED Notes (Signed)
Bed: [facility] Expected date:  Expected time:  Means of arrival:  Comments: EMS arm / shoulder pain

## 2014-05-31 NOTE — ED Provider Notes (Signed)
CSN: [id number]     Arrival date & time 05/31/14  0326 History   First MD Initiated Contact with Patient 05/31/14 [id number]     Chief Complaint  Patient presents with  . Arm Pain  . Neck Pain  . Anxiety      HPI Pt reports increasing anxiety and phobias. No HI or SI. No hallucinations. Denies HA. Reports poor sleep for several days. Hx of depression. Compliant with medications. Symptoms are mild/moderate in severity. Scheduled for mental health follow up in approx 2 weeks. Reports left shoulder pain, worse with movement. No CP or SOB. No cough, fever, or chills. No new weakness of arms or legs   Past Medical History  Diagnosis Date  . Coronary artery disease     non obstructive  . Stroke   . Seizures   . Anxiety   . Depression   . Migraine   . Fibromuscular dysplasia   . Tobacco abuse   . COPD (chronic obstructive pulmonary disease)   . Arthritis of knee     Bilateral  . Tobacco abuse   . Migraine headache   . Anxiety disorder   . Depression   . Seizure disorder   . Cerebrovascular disease   . CAD (coronary artery disease)   . Automatic implantable cardiac defibrillator in situ     [employer] ICD  . HTN (hypertension)   . HLD (hyperlipidemia)   . Ventricular fibrillation   . Long QT syndrome   . Cardiac arrest   . Atrial fibrillation     a) 2D echo 09/11/12: LVEF 55-60%, mild LVH, mld LA dilatation  . Allergy    Past Surgical History  Procedure Laterality Date  . Pacemaker insertion  05/01/2003    ICD, implantation of a Guidant single chamber defibrillator, Doylene Canning. Ladona Ridgel MD  . Cholecystectomy    . Tubal ligation    . Breast lumpectomy    . Appendectomy    . Cosmetic surgery     Family History  Problem Relation Age of Onset  . Dementia Mother   . Stroke Mother     Multiple  . Aneurysm Sister     Brain  . Stroke Sister   . Uterine cancer Other     Grandmother  . Colon cancer Sister   . Cancer Sister    History  Substance Use Topics  . Smoking  status: Current Some Day Smoker -- 0.25 packs/day for 40 years    Types: Cigarettes  . Smokeless tobacco: Never Used     Comment: almost a pack a week  . Alcohol Use: No   OB History   Grav Para Term Preterm Abortions TAB SAB Ect Mult Living                 Review of Systems  All other systems reviewed and are negative.     Allergies  Azithromycin; Ibuprofen; Codeine; and Demerol  Home Medications   Prior to Admission medications   Medication Sig Start Date End Date Taking? Authorizing Provider  albuterol (PROVENTIL HFA;VENTOLIN HFA) 108 (90 BASE) MCG/ACT inhaler Inhale 2 puffs into the lungs every 6 (six) hours as needed for wheezing.   Yes Historical Provider, MD  diazepam (VALIUM) 2 MG tablet Take 1 tablet (2 mg total) by mouth every 12 (twelve) hours as needed for anxiety. 04/25/14  Yes Carmelina Dane, MD  diphenhydrAMINE (BENADRYL) 25 MG tablet Take 50 mg by mouth every 6 (six) hours as needed for sleep.  Yes Historical Provider, MD  diphenhydramine-acetaminophen (TYLENOL PM) 25-500 MG TABS Take 2 tablets by mouth at bedtime as needed (pain/sleep).   Yes Historical Provider, MD  HYDROcodone-acetaminophen (NORCO/VICODIN) 5-325 MG per tablet Take 1 tablet by mouth every 6 (six) hours as needed for moderate pain. 05/12/14  Yes Elwin Mocha, MD  loperamide (IMODIUM A-D) 2 MG tablet Take 2 mg by mouth 4 (four) times daily as needed for diarrhea or loose stools.   Yes Historical Provider, MD  metoprolol tartrate (LOPRESSOR) 25 MG tablet Take 1 tablet (25 mg total) by mouth 2 (two) times daily. 11/29/13  Yes Iran Ouch, MD  Rivaroxaban (XARELTO) 15 MG TABS tablet Take 1 tablet (15 mg total) by mouth daily with supper. 04/25/14  Yes Carmelina Dane, MD  sertraline (ZOLOFT) 100 MG tablet Take 1.5 tablets (150 mg total) by mouth at bedtime. 04/25/14  Yes Carmelina Dane, MD  simvastatin (ZOCOR) 20 MG tablet Take 1 tablet (20 mg total) by mouth at bedtime. 03/07/14  Yes [place] Ouch, MD  diazepam (VALIUM) 5 MG tablet Take 1 tablet (5 mg total) by mouth every 8 (eight) hours as needed for anxiety (spasms). 05/13/14   Saralyn Pilar, DO  LORazepam (ATIVAN) 1 MG tablet Take 1 tablet (1 mg total) by mouth every 6 (six) hours as needed for anxiety. 05/25/14   Geoffery Lyons, MD   BP 144/90  Pulse 79  Temp(Src) 98.3 F (36.8 C) (Oral)  Resp 18  SpO2 98% Physical Exam  Nursing note and vitals reviewed. Constitutional: She is oriented to person, place, and time. She appears well-developed and well-nourished. No distress.  HENT:  Head: Normocephalic and atraumatic.  Eyes: EOM are normal.  Neck: Normal range of motion.  Cardiovascular: Normal rate, regular rhythm and normal heart sounds.   Pulmonary/Chest: Effort normal and breath sounds normal.  Abdominal: Soft. She exhibits no distension. There is no tenderness.  Musculoskeletal: Normal range of motion.  Full ROM of neck and left shoulder. Normal left radial pulse. Normal grip strength bilaterally.   Neurological: She is alert and oriented to person, place, and time.  Skin: Skin is warm and dry.  Psychiatric: She has a normal mood and affect. Judgment normal.    ED Course  Procedures (including critical care time) Labs Review Labs Reviewed - No data to display  Imaging Review No results found.  ECG interpretation   Date: 05/31/2014  Rate: 81  Rhythm: normal sinus rhythm  QRS Axis: normal  Intervals: normal  ST/T Wave abnormalities: normal  Conduction Disutrbances: none  Narrative Interpretation:   Old EKG Reviewed: No significant changes noted     MDM   Final diagnoses:  Anxiety    Doubt ACS. ecg normal. msk pain. Outpatient mental health follow up. No indication for inpatient treatment tonight. pcp follow up. May add sleep aid for several days to see if this helps    Lyanne Co, MD 05/31/14 [phone]

## 2014-05-31 NOTE — ED Provider Notes (Signed)
CSN: [id number]     Arrival date & time 05/31/14  4098 History   First MD Initiated Contact with Patient 05/31/14 [phone]     Chief Complaint  Patient presents with  . Nausea     (Consider location/radiation/quality/duration/timing/severity/associated sxs/prior Treatment) HPI 63 year old female presents with nausea, diarrhea, and abdominal pain after eating for several months. She was just discharged from the ER couple hours ago and waiting in the lobby the patient on arrival. She states that she became recurrently lightheaded and nauseous and check back in. She was given Ambien for sleep by Dr. Patria Mane has not left the ED to fill this. She's been complaining of trouble sleeping for the same amount time. She goes to the bathroom about 3-4 times a day and states it is a watery diarrhea. She's vomited very irregular but seems especially nauseous. She states she's also potentially lightheaded and this does not change with position. No chest pain, headaches, or shortness of breath. She seen her PCP for this and told it was anxiety but states they did not run any tests.  Past Medical History  Diagnosis Date  . Coronary artery disease     non obstructive  . Stroke   . Seizures   . Anxiety   . Depression   . Migraine   . Fibromuscular dysplasia   . Tobacco abuse   . COPD (chronic obstructive pulmonary disease)   . Arthritis of knee     Bilateral  . Tobacco abuse   . Migraine headache   . Anxiety disorder   . Depression   . Seizure disorder   . Cerebrovascular disease   . CAD (coronary artery disease)   . Automatic implantable cardiac defibrillator in situ     [employer] ICD  . HTN (hypertension)   . HLD (hyperlipidemia)   . Ventricular fibrillation   . Long QT syndrome   . Cardiac arrest   . Atrial fibrillation     a) 2D echo 09/11/12: LVEF 55-60%, mild LVH, mld LA dilatation  . Allergy    Past Surgical History  Procedure Laterality Date  . Pacemaker insertion  05/01/2003    ICD, implantation of a Guidant single chamber defibrillator, Doylene Canning. Ladona Ridgel MD  . Cholecystectomy    . Tubal ligation    . Breast lumpectomy    . Appendectomy    . Cosmetic surgery     Family History  Problem Relation Age of Onset  . Dementia Mother   . Stroke Mother     Multiple  . Aneurysm Sister     Brain  . Stroke Sister   . Uterine cancer Other     Grandmother  . Colon cancer Sister   . Cancer Sister    History  Substance Use Topics  . Smoking status: Current Some Day Smoker -- 0.25 packs/day for 40 years    Types: Cigarettes  . Smokeless tobacco: Never Used     Comment: almost a pack a week  . Alcohol Use: No   OB History   Grav Para Term Preterm Abortions TAB SAB Ect Mult Living                 Review of Systems  Respiratory: Negative for shortness of breath.   Cardiovascular: Negative for chest pain.  Gastrointestinal: Positive for nausea, vomiting and diarrhea. Negative for abdominal pain.  Genitourinary: Negative for dysuria.  Neurological: Positive for light-headedness.  All other systems reviewed and are negative.     Allergies  Azithromycin; Ibuprofen; Codeine; and Demerol  Home Medications   Prior to Admission medications   Medication Sig Start Date End Date Taking? Authorizing Provider  albuterol (PROVENTIL HFA;VENTOLIN HFA) 108 (90 BASE) MCG/ACT inhaler Inhale 2 puffs into the lungs every 6 (six) hours as needed for wheezing.    Historical Provider, MD  diazepam (VALIUM) 2 MG tablet Take 1 tablet (2 mg total) by mouth every 12 (twelve) hours as needed for anxiety. 04/25/14   Carmelina Dane, MD  diazepam (VALIUM) 5 MG tablet Take 1 tablet (5 mg total) by mouth every 8 (eight) hours as needed for anxiety (spasms). 05/13/14   Saralyn Pilar, DO  diphenhydrAMINE (BENADRYL) 25 MG tablet Take 50 mg by mouth every 6 (six) hours as needed for sleep.    Historical Provider, MD  diphenhydramine-acetaminophen (TYLENOL PM) 25-500 MG TABS Take 2  tablets by mouth at bedtime as needed (pain/sleep).    Historical Provider, MD  HYDROcodone-acetaminophen (NORCO/VICODIN) 5-325 MG per tablet Take 1 tablet by mouth every 6 (six) hours as needed for moderate pain. 05/12/14   Elwin Mocha, MD  loperamide (IMODIUM A-D) 2 MG tablet Take 2 mg by mouth 4 (four) times daily as needed for diarrhea or loose stools.    Historical Provider, MD  LORazepam (ATIVAN) 1 MG tablet Take 1 tablet (1 mg total) by mouth every 6 (six) hours as needed for anxiety. 05/25/14   Geoffery Lyons, MD  metoprolol tartrate (LOPRESSOR) 25 MG tablet Take 1 tablet (25 mg total) by mouth 2 (two) times daily. 11/29/13   Iran Ouch, MD  Rivaroxaban (XARELTO) 15 MG TABS tablet Take 1 tablet (15 mg total) by mouth daily with supper. 04/25/14   Carmelina Dane, MD  sertraline (ZOLOFT) 100 MG tablet Take 1.5 tablets (150 mg total) by mouth at bedtime. 04/25/14   Carmelina Dane, MD  simvastatin (ZOCOR) 20 MG tablet Take 1 tablet (20 mg total) by mouth at bedtime. 03/07/14   Iran Ouch, MD  zolpidem (AMBIEN) 5 MG tablet Take 1 tablet (5 mg total) by mouth at bedtime as needed for sleep. 05/31/14   Lyanne Co, MD   BP 127/88  Pulse 97  Temp(Src) 98.4 F (36.9 C) (Oral)  Resp 15  SpO2 100% Physical Exam  Nursing note and vitals reviewed. Constitutional: She is oriented to person, place, and time. She appears well-developed and well-nourished.  HENT:  Head: Normocephalic and atraumatic.  Right Ear: External ear normal.  Left Ear: External ear normal.  Nose: Nose normal.  Mouth/Throat: Oropharynx is clear and moist.  Eyes: Right eye exhibits no discharge. Left eye exhibits no discharge.  Cardiovascular: Normal rate, regular rhythm and normal heart sounds.   Pulmonary/Chest: Effort normal and breath sounds normal.  Abdominal: Soft. She exhibits no distension. There is no tenderness.  Neurological: She is alert and oriented to person, place, and time.  Skin: Skin is warm and  dry.    ED Course  Procedures (including critical care time) Labs Review Labs Reviewed  BASIC METABOLIC PANEL - Abnormal; Notable for the following:    Potassium 3.0 (*)    Glucose, Bld 113 (*)    BUN 25 (*)    Creatinine, Ser 1.23 (*)    GFR calc non Af Amer 45 (*)    GFR calc Af Amer 53 (*)    All other components within normal limits  CBG MONITORING, ED - Abnormal; Notable for the following:    Glucose-Capillary 123 (*)  All other components within normal limits  CBC    Imaging Review No results found.   EKG Interpretation   Date/Time:  Wednesday May 31 2014 09:21:44 EDT Ventricular Rate:  77 PR Interval:  233 QRS Duration: 99 QT Interval:  494 QTC Calculation: 559 R Axis:   53 Text Interpretation:  Sinus rhythm Prolonged PR interval Borderline T  abnormalities, diffuse leads Prolonged QT interval Baseline wander in  lead(s) V4 V5 V6 No significant change since last tracing Confirmed by  Adaliz Dobis  MD, Tiziana Cislo ([id number]) on 05/31/2014 9:24:03 AM      MDM   Final diagnoses:  Nausea  Hypokalemia    Patient has not had any vomiting while in the ED. She is resting comfortably. Her lab work is mostly unremarkable except for mild hypokalemia. Given this we'll give K-Dur here and has a short-term replacement. We'll give Zofran prescription for prn nausea. Will recommend she follow up with her PCP. At this time she appears well-hydrated and I feel she is stable for discharge.    Audree Camel, MD 05/31/14 1043

## 2014-05-31 NOTE — ED Notes (Signed)
Patient states she was sitting in the lobby waiting on her ride and began to feel lightheaded. Patient states she had one BM that was that "watery" and states has vomited x1. Patient denies any pain.

## 2014-05-31 NOTE — ED Notes (Addendum)
MD at bedside. TTS AT BS. 

## 2014-05-31 NOTE — Discharge Instructions (Signed)
Nausea and Vomiting °Nausea is a sick feeling that often comes before throwing up (vomiting). Vomiting is a reflex where stomach contents come out of your mouth. Vomiting can cause severe loss of body fluids (dehydration). Children and elderly adults can become dehydrated quickly, especially if they also have diarrhea. Nausea and vomiting are symptoms of a condition or disease. It is important to find the cause of your symptoms. °CAUSES  °· Direct irritation of the stomach lining. This irritation can result from increased acid production (gastroesophageal reflux disease), infection, food poisoning, taking certain medicines (such as nonsteroidal anti-inflammatory drugs), alcohol use, or tobacco use. °· Signals from the brain. These signals could be caused by a headache, heat exposure, an inner ear disturbance, increased pressure in the brain from injury, infection, a tumor, or a concussion, pain, emotional stimulus, or metabolic problems. °· An obstruction in the gastrointestinal tract (bowel obstruction). °· Illnesses such as diabetes, hepatitis, gallbladder problems, appendicitis, kidney problems, cancer, sepsis, atypical symptoms of a heart attack, or eating disorders. °· Medical treatments such as chemotherapy and radiation. °· Receiving medicine that makes you sleep (general anesthetic) during surgery. °DIAGNOSIS °Your caregiver may ask for tests to be done if the problems do not improve after a few days. Tests may also be done if symptoms are severe or if the reason for the nausea and vomiting is not clear. Tests may include: °· Urine tests. °· Blood tests. °· Stool tests. °· Cultures (to look for evidence of infection). °· X-rays or other imaging studies. °Test results can help your caregiver make decisions about treatment or the need for additional tests. °TREATMENT °You need to stay well hydrated. Drink frequently but in small amounts. You may wish to drink water, sports drinks, clear broth, or eat frozen  ice pops or gelatin dessert to help stay hydrated. When you eat, eating slowly may help prevent nausea. There are also some antinausea medicines that may help prevent nausea. °HOME CARE INSTRUCTIONS  °· Take all medicine as directed by your caregiver. °· If you do not have an appetite, do not force yourself to eat. However, you must continue to drink fluids. °· If you have an appetite, eat a normal diet unless your caregiver tells you differently. °· Eat a variety of complex carbohydrates (rice, wheat, potatoes, bread), lean meats, yogurt, fruits, and vegetables. °· Avoid high-fat foods because they are more difficult to digest. °· Drink enough water and fluids to keep your urine clear or pale yellow. °· If you are dehydrated, ask your caregiver for specific rehydration instructions. Signs of dehydration may include: °· Severe thirst. °· Dry lips and mouth. °· Dizziness. °· Dark urine. °· Decreasing urine frequency and amount. °· Confusion. °· Rapid breathing or pulse. °SEEK IMMEDIATE MEDICAL CARE IF:  °· You have blood or brown flecks (like coffee grounds) in your vomit. °· You have black or bloody stools. °· You have a severe headache or stiff neck. °· You are confused. °· You have severe abdominal pain. °· You have chest pain or trouble breathing. °· You do not urinate at least once every 8 hours. °· You develop cold or clammy skin. °· You continue to vomit for longer than 24 to 48 hours. °· You have a fever. °MAKE SURE YOU:  °· Understand these instructions. °· Will watch your condition. °· Will get help right away if you are not doing well or get worse. °Document Released: 09/08/2005 Document Revised: 12/01/2011 Document Reviewed: 02/05/2011 °ExitCare® Patient Information ©2015 [employer]. This information is not intended   to replace advice given to you by your health care provider. Make sure you discuss any questions you have with your health care provider. °Hypokalemia °Hypokalemia means that the amount of  potassium in the blood is lower than normal. Potassium is a chemical, called an electrolyte, that helps regulate the amount of fluid in the body. It also stimulates muscle contraction and helps nerves function properly. Most of the body's potassium is inside of cells, and only a very small amount is in the blood. Because the amount in the blood is so small, minor changes can be life-threatening. °CAUSES °· Antibiotics. °· Diarrhea or vomiting. °· Using laxatives too much, which can cause diarrhea. °· Chronic kidney disease. °· Water pills (diuretics). °· Eating disorders (bulimia). °· Low magnesium level. °· Sweating a lot. °SIGNS AND SYMPTOMS °· Weakness. °· Constipation. °· Fatigue. °· Muscle cramps. °· Mental confusion. °· Skipped heartbeats or irregular heartbeat (palpitations). °· Tingling or numbness. °DIAGNOSIS  °Your health care provider can diagnose hypokalemia with blood tests. In addition to checking your potassium level, your health care provider may also check other lab tests. °TREATMENT °Hypokalemia can be treated with potassium supplements taken by mouth or adjustments in your current medicines. If your potassium level is very low, you may need to get potassium through a vein (IV) and be monitored in the hospital. A diet high in potassium is also helpful. Foods high in potassium are: °· Nuts, such as peanuts and pistachios. °· Seeds, such as sunflower seeds and pumpkin seeds. °· Peas, lentils, and lima beans. °· Whole grain and bran cereals and breads. °· Fresh fruit and vegetables, such as apricots, avocado, bananas, cantaloupe, kiwi, oranges, tomatoes, asparagus, and potatoes. °· Orange and tomato juices. °· Red meats. °· Fruit yogurt. °HOME CARE INSTRUCTIONS °· Take all medicines as prescribed by your health care provider. °· Maintain a healthy diet by including nutritious food, such as fruits, vegetables, nuts, whole grains, and lean meats. °· If you are taking a laxative, be sure to follow the  directions on the label. °SEEK MEDICAL CARE IF: °· Your weakness gets worse. °· You feel your heart pounding or racing. °· You are vomiting or having diarrhea. °· You are diabetic and having trouble keeping your blood glucose in the normal range. °SEEK IMMEDIATE MEDICAL CARE IF: °· You have chest pain, shortness of breath, or dizziness. °· You are vomiting or having diarrhea for more than 2 days. °· You faint. °MAKE SURE YOU:  °· Understand these instructions. °· Will watch your condition. °· Will get help right away if you are not doing well or get worse. °Document Released: 09/08/2005 Document Revised: 06/29/2013 Document Reviewed: 03/11/2013 °ExitCare® Patient Information ©2015 [employer]. This information is not intended to replace advice given to you by your health care provider. Make sure you discuss any questions you have with your health care provider. ° °

## 2014-05-31 NOTE — ED Notes (Signed)
Patient making herself vomit by placing pressure on abdomin

## 2014-05-31 NOTE — Discharge Instructions (Signed)

## 2014-06-04 ENCOUNTER — Emergency Department: Payer: Self-pay | Admitting: Emergency Medicine

## 2014-06-04 LAB — BASIC METABOLIC PANEL
Anion Gap: 13 (ref 7–16)
BUN: 13 mg/dL (ref 7–18)
CALCIUM: 9.4 mg/dL (ref 8.5–10.1)
CREATININE: 1.25 mg/dL (ref 0.60–1.30)
Chloride: 104 mmol/L (ref 98–107)
Co2: 22 mmol/L (ref 21–32)
EGFR (African American): 53 — ABNORMAL LOW
GFR CALC NON AF AMER: 45 — AB
Glucose: 92 mg/dL (ref 65–99)
Osmolality: 277 (ref 275–301)
Potassium: 3.5 mmol/L (ref 3.5–5.1)
Sodium: 139 mmol/L (ref 136–145)

## 2014-06-04 LAB — CBC WITH DIFFERENTIAL/PLATELET
BASOS ABS: 0.1 10*3/uL (ref 0.0–0.1)
BASOS PCT: 0.9 %
EOS PCT: 1.4 %
Eosinophil #: 0.2 10*3/uL (ref 0.0–0.7)
HCT: 42.5 % (ref 35.0–47.0)
HGB: 14.3 g/dL (ref 12.0–16.0)
LYMPHS PCT: 20.3 %
Lymphocyte #: 2.3 10*3/uL (ref 1.0–3.6)
MCH: 31.5 pg (ref 26.0–34.0)
MCHC: 33.6 g/dL (ref 32.0–36.0)
MCV: 94 fL (ref 80–100)
MONO ABS: 0.7 x10 3/mm (ref 0.2–0.9)
Monocyte %: 6.7 %
NEUTROS PCT: 70.7 %
Neutrophil #: 7.8 10*3/uL — ABNORMAL HIGH (ref 1.4–6.5)
Platelet: 275 10*3/uL (ref 150–440)
RBC: 4.53 10*6/uL (ref 3.80–5.20)
RDW: 13.2 % (ref 11.5–14.5)
WBC: 11.1 10*3/uL — ABNORMAL HIGH (ref 3.6–11.0)

## 2014-06-04 LAB — URINALYSIS, COMPLETE
BACTERIA: NONE SEEN
Bilirubin,UR: NEGATIVE
Blood: NEGATIVE
Glucose,UR: NEGATIVE mg/dL (ref 0–75)
Hyaline Cast: 1
KETONE: NEGATIVE
LEUKOCYTE ESTERASE: NEGATIVE
NITRITE: NEGATIVE
Ph: 5 (ref 4.5–8.0)
RBC,UR: 2 /HPF (ref 0–5)
SPECIFIC GRAVITY: 1.021 (ref 1.003–1.030)
Squamous Epithelial: 1

## 2014-06-04 LAB — TROPONIN I

## 2014-06-05 ENCOUNTER — Ambulatory Visit: Payer: Self-pay | Admitting: Family Medicine

## 2014-06-07 ENCOUNTER — Emergency Department (HOSPITAL_COMMUNITY)
Admission: EM | Admit: 2014-06-07 | Discharge: 2014-06-07 | Disposition: A | Discharge status: Home / Self Care | Payer: Self-pay | Attending: Emergency Medicine | Admitting: Emergency Medicine

## 2014-06-07 ENCOUNTER — Encounter (HOSPITAL_COMMUNITY): Payer: Self-pay | Admitting: Emergency Medicine

## 2014-06-07 ENCOUNTER — Emergency Department (HOSPITAL_COMMUNITY): Payer: Self-pay

## 2014-06-07 DIAGNOSIS — Z8673 Personal history of transient ischemic attack (TIA), and cerebral infarction without residual deficits: Secondary | ICD-10-CM | POA: Insufficient documentation

## 2014-06-07 DIAGNOSIS — E785 Hyperlipidemia, unspecified: Secondary | ICD-10-CM | POA: Insufficient documentation

## 2014-06-07 DIAGNOSIS — I10 Essential (primary) hypertension: Secondary | ICD-10-CM | POA: Insufficient documentation

## 2014-06-07 DIAGNOSIS — J449 Chronic obstructive pulmonary disease, unspecified: Secondary | ICD-10-CM | POA: Insufficient documentation

## 2014-06-07 DIAGNOSIS — Z87891 Personal history of nicotine dependence: Secondary | ICD-10-CM | POA: Insufficient documentation

## 2014-06-07 DIAGNOSIS — J4489 Other specified chronic obstructive pulmonary disease: Secondary | ICD-10-CM | POA: Insufficient documentation

## 2014-06-07 DIAGNOSIS — F329 Major depressive disorder, single episode, unspecified: Secondary | ICD-10-CM | POA: Insufficient documentation

## 2014-06-07 DIAGNOSIS — Z8739 Personal history of other diseases of the musculoskeletal system and connective tissue: Secondary | ICD-10-CM | POA: Insufficient documentation

## 2014-06-07 DIAGNOSIS — I4891 Unspecified atrial fibrillation: Secondary | ICD-10-CM | POA: Insufficient documentation

## 2014-06-07 DIAGNOSIS — F3289 Other specified depressive episodes: Secondary | ICD-10-CM | POA: Insufficient documentation

## 2014-06-07 DIAGNOSIS — I482 Chronic atrial fibrillation, unspecified: Secondary | ICD-10-CM

## 2014-06-07 DIAGNOSIS — Z8674 Personal history of sudden cardiac arrest: Secondary | ICD-10-CM | POA: Insufficient documentation

## 2014-06-07 DIAGNOSIS — I2510 Atherosclerotic heart disease of native coronary artery without angina pectoris: Secondary | ICD-10-CM | POA: Insufficient documentation

## 2014-06-07 DIAGNOSIS — Z79899 Other long term (current) drug therapy: Secondary | ICD-10-CM | POA: Insufficient documentation

## 2014-06-07 DIAGNOSIS — Z7901 Long term (current) use of anticoagulants: Secondary | ICD-10-CM | POA: Insufficient documentation

## 2014-06-07 DIAGNOSIS — R002 Palpitations: Secondary | ICD-10-CM | POA: Insufficient documentation

## 2014-06-07 DIAGNOSIS — F411 Generalized anxiety disorder: Secondary | ICD-10-CM | POA: Insufficient documentation

## 2014-06-07 DIAGNOSIS — Z95810 Presence of automatic (implantable) cardiac defibrillator: Secondary | ICD-10-CM | POA: Insufficient documentation

## 2014-06-07 DIAGNOSIS — R0602 Shortness of breath: Secondary | ICD-10-CM | POA: Insufficient documentation

## 2014-06-07 DIAGNOSIS — R079 Chest pain, unspecified: Secondary | ICD-10-CM | POA: Insufficient documentation

## 2014-06-07 LAB — COMPREHENSIVE METABOLIC PANEL
ALT: 15 U/L (ref 0–35)
AST: 18 U/L (ref 0–37)
Albumin: 3.6 g/dL (ref 3.5–5.2)
Alkaline Phosphatase: 85 U/L (ref 39–117)
Anion gap: 11 (ref 5–15)
BUN: 15 mg/dL (ref 6–23)
CO2: 25 mEq/L (ref 19–32)
CREATININE: 1.24 mg/dL — AB (ref 0.50–1.10)
Calcium: 9.7 mg/dL (ref 8.4–10.5)
Chloride: 103 mEq/L (ref 96–112)
GFR calc Af Amer: 52 mL/min — ABNORMAL LOW (ref >90)
GFR calc non Af Amer: 45 mL/min — ABNORMAL LOW (ref >90)
Glucose, Bld: 96 mg/dL (ref 70–99)
Potassium: 3.2 mEq/L — ABNORMAL LOW (ref 3.7–5.3)
SODIUM: 139 meq/L (ref 137–147)
TOTAL PROTEIN: 6.7 g/dL (ref 6.0–8.3)
Total Bilirubin: 0.4 mg/dL (ref 0.3–1.2)

## 2014-06-07 LAB — CBC
HCT: 39.1 % (ref 36.0–46.0)
Hemoglobin: 13.5 g/dL (ref 12.0–15.0)
MCH: 31.5 pg (ref 26.0–34.0)
MCHC: 34.5 g/dL (ref 30.0–36.0)
MCV: 91.4 fL (ref 78.0–100.0)
PLATELETS: 262 10*3/uL (ref 150–400)
RBC: 4.28 MIL/uL (ref 3.87–5.11)
RDW: 13.1 % (ref 11.5–15.5)
WBC: 6.9 10*3/uL (ref 4.0–10.5)

## 2014-06-07 LAB — I-STAT TROPONIN, ED
TROPONIN I, POC: 0 ng/mL (ref 0.00–0.08)
TROPONIN I, POC: 0 ng/mL (ref 0.00–0.08)

## 2014-06-07 MED ORDER — LORAZEPAM 2 MG/ML IJ SOLN
1.0000 mg | Freq: Once | INTRAMUSCULAR | Status: AC
  Administered 2014-06-07: 1 mg via INTRAVENOUS
  Filled 2014-06-07: qty 1

## 2014-06-07 NOTE — ED Provider Notes (Signed)
3:06 PM Patient taken in shift sign out.    Plan:  Recheck troponin at 1630.  If negative, patient can be discharged to home.  4:43 PM Patient feeling well.  Still in NSR.  Delta troponin is negative.  No complaints at this time.  DC to home with [facility] follow-up.  Roxy Horseman, PA-C 06/07/14 1644 Medical screening examination/treatment/procedure(s) were performed by non-physician practitioner and as supervising physician I was immediately available for consultation/collaboration.   EKG Interpretation   Date/Time:  Wednesday June 07 2014 12:05:43 EDT Ventricular Rate:  100 PR Interval:    QRS Duration: 88 QT Interval:  404 QTC Calculation: 521 R Axis:   4 Text Interpretation:  Atrial flutter with predominant 3:1 AV block Repol  abnrm suggests ischemia, diffuse leads No significant change since last  tracing Confirmed by GOLDSTON  MD, SCOTT ([id number]) on 06/07/2014 3:34:06 PM        Rolland Porter, MD 06/13/14 1537

## 2014-06-07 NOTE — Discharge Instructions (Signed)

## 2014-06-07 NOTE — ED Provider Notes (Signed)
CSN: [id number]     Arrival date & time 06/07/14  1159 History   First MD Initiated Contact with Patient 06/07/14 1201     Chief Complaint  Patient presents with  . Atrial Fibrillation     (Consider location/radiation/quality/duration/timing/severity/associated sxs/prior Treatment) HPI Comments: Patient is a 63 yo F PMHx significant for non-obs CAD, CAD, CVA, HTN, HLD, A. Fib presenting to the ED for an episode of atrial fibrillation chest pain that began approximately 9:30 AM. Patient states she had associated shortness of breath anxiety, which is typical symptoms with her atrial fibrillation. Patient symptoms resolved after receiving 10 mg of Cardizem via EMS. She has been symptom free while in the ED. She is only complaining of increased anxiety, fatigue, and decreased over the last month since her physician has been slowly decreasing her valium dosage. Patient reports she missed her Metoprolol dose last evening. Denies any fevers, chills, nausea, vomiting, abdominal pain.   Patient is a 63 y.o. female presenting with atrial fibrillation.  Atrial Fibrillation Associated symptoms include chest pain. Pertinent negatives include no chills or fever.    Past Medical History  Diagnosis Date  . Coronary artery disease     non obstructive  . Stroke   . Seizures   . Anxiety   . Depression   . Migraine   . Fibromuscular dysplasia   . Tobacco abuse   . COPD (chronic obstructive pulmonary disease)   . Arthritis of knee     Bilateral  . Tobacco abuse   . Migraine headache   . Anxiety disorder   . Depression   . Seizure disorder   . Cerebrovascular disease   . CAD (coronary artery disease)   . Automatic implantable cardiac defibrillator in situ     [employer] ICD  . HTN (hypertension)   . HLD (hyperlipidemia)   . Ventricular fibrillation   . Long QT syndrome   . Cardiac arrest   . Atrial fibrillation     a) 2D echo 09/11/12: LVEF 55-60%, mild LVH, mld LA dilatation  .  Allergy    Past Surgical History  Procedure Laterality Date  . Pacemaker insertion  05/01/2003    ICD, implantation of a Guidant single chamber defibrillator, Doylene Canning. Ladona Ridgel MD  . Cholecystectomy    . Tubal ligation    . Breast lumpectomy    . Appendectomy    . Cosmetic surgery     Family History  Problem Relation Age of Onset  . Dementia Mother   . Stroke Mother     Multiple  . Aneurysm Sister     Brain  . Stroke Sister   . Uterine cancer Other     Grandmother  . Colon cancer Sister   . Cancer Sister    History  Substance Use Topics  . Smoking status: Former Smoker -- 0.25 packs/day for 40 years    Types: Cigarettes  . Smokeless tobacco: Never Used     Comment: almost a pack a week  . Alcohol Use: No   OB History   Grav Para Term Preterm Abortions TAB SAB Ect Mult Living                 Review of Systems  Constitutional: Negative for fever and chills.  Respiratory: Positive for shortness of breath.   Cardiovascular: Positive for chest pain and palpitations.  All other systems reviewed and are negative.     Allergies  Azithromycin; Ibuprofen; Codeine; and Demerol  Home Medications  Prior to Admission medications   Medication Sig Start Date End Date Taking? Authorizing Provider  albuterol (PROVENTIL HFA;VENTOLIN HFA) 108 (90 BASE) MCG/ACT inhaler Inhale 2 puffs into the lungs every 6 (six) hours as needed for wheezing.   Yes Historical Provider, MD  diphenhydrAMINE (BENADRYL) 25 MG tablet Take 50 mg by mouth every 6 (six) hours as needed for allergies or sleep.    Yes Historical Provider, MD  loperamide (IMODIUM A-D) 2 MG tablet Take 2 mg by mouth 4 (four) times daily as needed for diarrhea or loose stools.   Yes Historical Provider, MD  metoprolol tartrate (LOPRESSOR) 25 MG tablet Take 1 tablet (25 mg total) by mouth 2 (two) times daily. 11/29/13  Yes Iran Ouch, MD  potassium chloride SA (K-DUR,KLOR-CON) 20 MEQ tablet Take 1 tablet (20 mEq total) by  mouth daily. 05/31/14  Yes Audree Camel, MD  Rivaroxaban (XARELTO) 15 MG TABS tablet Take 15 mg by mouth at bedtime.   Yes Historical Provider, MD  sertraline (ZOLOFT) 100 MG tablet Take 1.5 tablets (150 mg total) by mouth at bedtime. 04/25/14  Yes Carmelina Dane, MD  simvastatin (ZOCOR) 20 MG tablet Take 1 tablet (20 mg total) by mouth at bedtime. 03/07/14  Yes Iran Ouch, MD  triamcinolone (NASACORT ALLERGY 24HR) 55 MCG/ACT AERO nasal inhaler Place 2 sprays into the nose daily as needed.   Yes Historical Provider, MD  zolpidem (AMBIEN) 5 MG tablet Take 1 tablet (5 mg total) by mouth at bedtime as needed for sleep. 05/31/14  Yes Lyanne Co, MD   BP 111/61  Pulse 74  Temp(Src) 98.6 F (37 C) (Oral)  Resp 29  SpO2 98% Physical Exam  Nursing note and vitals reviewed. Constitutional: She is oriented to person, place, and time. She appears well-developed and well-nourished. No distress.  HENT:  Head: Normocephalic and atraumatic.  Right Ear: External ear normal.  Left Ear: External ear normal.  Nose: Nose normal.  Mouth/Throat: Oropharynx is clear and moist. No oropharyngeal exudate.  Eyes: Conjunctivae are normal.  Neck: Neck supple.  Cardiovascular: Normal rate, normal heart sounds and intact distal pulses.  An irregularly irregular rhythm present.  Pulmonary/Chest: Effort normal and breath sounds normal. No respiratory distress. She exhibits no tenderness.  Abdominal: Soft. Normal appearance and bowel sounds are normal. There is no tenderness.  Musculoskeletal: Normal range of motion. She exhibits no edema.  Neurological: She is alert and oriented to person, place, and time.  Skin: Skin is warm and dry. She is not diaphoretic.  Psychiatric: Her mood appears anxious.    ED Course  Procedures (including critical care time) Medications  LORazepam (ATIVAN) injection 1 mg (1 mg Intravenous Given 06/07/14 1426)     Labs Review Labs Reviewed  COMPREHENSIVE METABOLIC PANEL  - Abnormal; Notable for the following:    Potassium 3.2 (*)    Creatinine, Ser 1.24 (*)    GFR calc non Af Amer 45 (*)    GFR calc Af Amer 52 (*)    All other components within normal limits  CBC  I-STAT TROPOININ, ED  Rosezena Sensor, ED    Imaging Review Dg Chest 2 View  06/07/2014   CLINICAL DATA:  ATRIAL FIBRILLATION  EXAM: CHEST  2 VIEW  COMPARISON:  Radiograph 05/13/2014  FINDINGS: Left-sided pacemaker overlies normal cardiac silhouette. Lungs are clear. No pulmonary edema or infiltrate. No pneumothorax.  IMPRESSION: No acute cardiopulmonary process.   Electronically Signed   By: Loura Halt.D.  On: 06/07/2014 14:01     EKG Interpretation   Date/Time:  Wednesday June 07 2014 12:05:43 EDT Ventricular Rate:  100 PR Interval:    QRS Duration: 88 QT Interval:  404 QTC Calculation: 521 R Axis:   4 Text Interpretation:  Atrial flutter with predominant 3:1 AV block Repol  abnrm suggests ischemia, diffuse leads No significant change since last  tracing Confirmed by GOLDSTON  MD, SCOTT ([id number]) on 06/07/2014 3:34:06 PM      MDM   Final diagnoses:  Chronic atrial fibrillation    Filed Vitals:   06/07/14 1530  BP: 111/61  Pulse: 74  Temp:   Resp: 29   Afebrile, NAD, non-toxic appearing, AAOx4. I have reviewed nursing notes, vital signs, and all appropriate lab and imaging results for this patient.   Patient presenting for evaluation of atrial fibrillation. Patient's known history and is supposed to be on metoprolol and Xarelto. Patient with a missed dose of her metoprolol last night. Episode of atrial fibrillation and chest pain that began around 9:30 this morning. Resolved after EMS gave 10 mg of Cardizem. First troponin is unremarkable. EKG shows rate controlled A. Flutter. Patient has been rate controlled while in the ED. Plan to repeat troponin at 1630, if negative and patient remains asymptomatic plan to discharge home with cardiology and PCP f/u. Signed out  to [employer], PA-C. Patient d/w with Dr. Criss Alvine, agrees with plan.      Jeannetta Ellis, PA-C 06/07/14 1539

## 2014-06-07 NOTE — ED Notes (Signed)
Pt reports to the ED via [facility] for eval of atrial fibrillation and chest pain. She reports she is normally in NSR and when she goes into A. Fib she gets chest pain, anxiety, and SOB. Upon EMS arrival pts HR was 120s-140s. En route pt given 10 mg of Cardizem and that decreased her HR to 80s-110s. Pt also given 324 of ASA PTA. Pt reports her pain has decreased since she was given Cardizem. Pt reports she missed her Metoprolol last night because she forgot to take it. Reports over the past month she has had decreased PO intake and fatigue. Pt A&Ox4, resp e/u, and skin warm and dry.

## 2014-06-10 NOTE — ED Provider Notes (Signed)
Medical screening examination/treatment/procedure(s) were conducted as a shared visit with non-physician practitioner(s) and myself.  I personally evaluated the patient during the encounter.   EKG Interpretation   Date/Time:  Wednesday June 07 2014 12:05:43 EDT Ventricular Rate:  100 PR Interval:    QRS Duration: 88 QT Interval:  404 QTC Calculation: 521 R Axis:   4 Text Interpretation:  Atrial flutter with predominant 3:1 AV block Repol  abnrm suggests ischemia, diffuse leads No significant change since last  tracing Confirmed by Marixa Mellott  MD, Sharmon Cheramie ([id number]) on 06/07/2014 3:34:06 PM       Patient with transient Afib that converted with EMS cardizem. Did not take dose of metoprolol this AM. Had some transient shortness of breath at this time but no pain. Given her being somewhat symptomatic Will do 2 troponins if negative I have low suspicion for ACS and all her symptoms were likely rate related.  Audree Camel, MD 06/10/14 [phone]

## 2014-06-13 ENCOUNTER — Other Ambulatory Visit: Payer: Self-pay | Admitting: *Deleted

## 2014-06-13 MED ORDER — RIVAROXABAN 15 MG PO TABS: 15.0000 mg | ORAL_TABLET | Freq: Every day | ORAL | Status: DC

## 2014-06-19 ENCOUNTER — Emergency Department (HOSPITAL_COMMUNITY): Payer: Self-pay

## 2014-06-19 ENCOUNTER — Emergency Department (HOSPITAL_COMMUNITY)
Admission: EM | Admit: 2014-06-19 | Discharge: 2014-06-19 | Discharge status: Against Medical Advice | Payer: Self-pay | Attending: Emergency Medicine | Admitting: Emergency Medicine

## 2014-06-19 ENCOUNTER — Encounter (HOSPITAL_COMMUNITY): Payer: Self-pay | Admitting: Emergency Medicine

## 2014-06-19 DIAGNOSIS — I2510 Atherosclerotic heart disease of native coronary artery without angina pectoris: Secondary | ICD-10-CM | POA: Insufficient documentation

## 2014-06-19 DIAGNOSIS — R079 Chest pain, unspecified: Secondary | ICD-10-CM | POA: Insufficient documentation

## 2014-06-19 DIAGNOSIS — Z95810 Presence of automatic (implantable) cardiac defibrillator: Secondary | ICD-10-CM | POA: Insufficient documentation

## 2014-06-19 DIAGNOSIS — J449 Chronic obstructive pulmonary disease, unspecified: Secondary | ICD-10-CM | POA: Insufficient documentation

## 2014-06-19 DIAGNOSIS — I10 Essential (primary) hypertension: Secondary | ICD-10-CM | POA: Insufficient documentation

## 2014-06-19 DIAGNOSIS — J4489 Other specified chronic obstructive pulmonary disease: Secondary | ICD-10-CM | POA: Insufficient documentation

## 2014-06-19 LAB — CBC
HCT: 39.7 % (ref 36.0–46.0)
Hemoglobin: 13.9 g/dL (ref 12.0–15.0)
MCH: 32.2 pg (ref 26.0–34.0)
MCHC: 35 g/dL (ref 30.0–36.0)
MCV: 91.9 fL (ref 78.0–100.0)
Platelets: 233 10*3/uL (ref 150–400)
RBC: 4.32 MIL/uL (ref 3.87–5.11)
RDW: 12.8 % (ref 11.5–15.5)
WBC: 7.6 10*3/uL (ref 4.0–10.5)

## 2014-06-19 LAB — COMPREHENSIVE METABOLIC PANEL
ALBUMIN: 3.7 g/dL (ref 3.5–5.2)
ALT: 14 U/L (ref 0–35)
AST: 17 U/L (ref 0–37)
Alkaline Phosphatase: 81 U/L (ref 39–117)
Anion gap: 14 (ref 5–15)
BUN: 15 mg/dL (ref 6–23)
CALCIUM: 9.4 mg/dL (ref 8.4–10.5)
CO2: 23 meq/L (ref 19–32)
Chloride: 103 mEq/L (ref 96–112)
Creatinine, Ser: 1.23 mg/dL — ABNORMAL HIGH (ref 0.50–1.10)
GFR calc Af Amer: 53 mL/min — ABNORMAL LOW (ref >90)
GFR calc non Af Amer: 45 mL/min — ABNORMAL LOW (ref >90)
Glucose, Bld: 90 mg/dL (ref 70–99)
Potassium: 3.9 mEq/L (ref 3.7–5.3)
SODIUM: 140 meq/L (ref 137–147)
Total Bilirubin: 0.2 mg/dL — ABNORMAL LOW (ref 0.3–1.2)
Total Protein: 7.5 g/dL (ref 6.0–8.3)

## 2014-06-19 LAB — I-STAT TROPONIN, ED: Troponin i, poc: 0 ng/mL (ref 0.00–0.08)

## 2014-06-19 NOTE — ED Notes (Signed)
Pt walked out of ED- this RN asked pt where she was going, pt states "I'm going home, I feel fine".  This RN encouraged pt to stay and see doctor, explained that it would not be long to get her to a room.  Pt reported she wanted to leave anyway- IV removed.

## 2014-06-19 NOTE — ED Notes (Addendum)
Pt to ED via [facility] from home for evaluation of chest pain onset 730pm.  Pt describes pain as a pressure, denies radiation of pain.  Pt has had 324 ASA and 1 nitro and  Zofran, pt currently rating CP 2/10.  Pt sinus on the monitor at present.

## 2014-06-27 ENCOUNTER — Encounter: Payer: Self-pay | Admitting: Internal Medicine

## 2014-06-27 ENCOUNTER — Ambulatory Visit (INDEPENDENT_AMBULATORY_CARE_PROVIDER_SITE_OTHER): Payer: Self-pay | Admitting: *Deleted

## 2014-06-27 ENCOUNTER — Encounter: Payer: Self-pay | Admitting: Cardiovascular Disease

## 2014-06-27 ENCOUNTER — Ambulatory Visit (INDEPENDENT_AMBULATORY_CARE_PROVIDER_SITE_OTHER): Payer: Self-pay | Admitting: Cardiovascular Disease

## 2014-06-27 VITALS — BP 140/80 | HR 73 | Ht 64.0 in | Wt 170.8 lb

## 2014-06-27 DIAGNOSIS — I469 Cardiac arrest, cause unspecified: Secondary | ICD-10-CM

## 2014-06-27 DIAGNOSIS — I480 Paroxysmal atrial fibrillation: Secondary | ICD-10-CM

## 2014-06-27 DIAGNOSIS — I4891 Unspecified atrial fibrillation: Secondary | ICD-10-CM

## 2014-06-27 DIAGNOSIS — F17200 Nicotine dependence, unspecified, uncomplicated: Secondary | ICD-10-CM

## 2014-06-27 DIAGNOSIS — Z720 Tobacco use: Secondary | ICD-10-CM

## 2014-06-27 LAB — MDC_IDC_ENUM_SESS_TYPE_INCLINIC
Date Time Interrogation Session: 20151006040000
HighPow Impedance: 42 Ohm
HighPow Impedance: 61 Ohm
Implantable Pulse Generator Serial Number: 100111
Lead Channel Pacing Threshold Amplitude: 0.6 V
Lead Channel Sensing Intrinsic Amplitude: 12.6 mV
Lead Channel Setting Pacing Amplitude: 2.4 V
Lead Channel Setting Pacing Pulse Width: 0.4 ms
Lead Channel Setting Sensing Sensitivity: 0.5 mV
MDC IDC MSMT LEADCHNL RV IMPEDANCE VALUE: 1101 Ohm
MDC IDC MSMT LEADCHNL RV PACING THRESHOLD PULSEWIDTH: 0.4 ms
MDC IDC SET ZONE DETECTION INTERVAL: 286 ms
MDC IDC STAT BRADY RV PERCENT PACED: 1 %
Zone Setting Detection Interval: 353 ms

## 2014-06-27 MED ORDER — SIMVASTATIN 20 MG PO TABS: 20.0000 mg | ORAL_TABLET | Freq: Every day | ORAL | Status: DC

## 2014-06-27 MED ORDER — DIAZEPAM 5 MG PO TABS: 5.0000 mg | ORAL_TABLET | Freq: Two times a day (BID) | ORAL | Status: DC | PRN

## 2014-06-27 MED ORDER — METOPROLOL TARTRATE 25 MG PO TABS: ORAL_TABLET | ORAL | Status: DC

## 2014-06-27 MED ORDER — RIVAROXABAN 15 MG PO TABS: 15.0000 mg | ORAL_TABLET | Freq: Every day | ORAL | Status: DC

## 2014-06-27 NOTE — Progress Notes (Signed)
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 She also has history of paroxysmal atrial fibrillation. She has been treated with metoprolol. She is known to have anxiety. She had recurrent admissions in 2013 for atrial fibrillation with rapid ventricular response. No recent hospitalization although she had multiple recent emergency room visit for palpitations .  she is on anticoagulation with Xarelto.  She reports adapting better today to atrial fibrillation now. She continues to have anxiety and she feels that many of her episodes are triggered by anxiety.   Allergies  Allergen Reactions  . Azithromycin Other (See Comments)    Hypotension   . Ibuprofen Other (See Comments)    Hypotension. Takes ASA without problem hypotension  . Codeine Rash  . Demerol [Meperidine] Rash     Current Outpatient Prescriptions on File Prior to Visit  Medication Sig Dispense Refill  . albuterol (PROVENTIL HFA;VENTOLIN HFA) 108 (90 BASE) MCG/ACT inhaler Inhale 2 puffs into the lungs every 6 (six) hours as needed for wheezing.      . diphenhydrAMINE (BENADRYL) 25 MG tablet Take 50 mg by mouth every 6 (six) hours as needed for allergies or sleep.       Marland Kitchen loperamide (IMODIUM A-D) 2 MG tablet Take 2 mg by mouth 4 (four) times daily as needed for diarrhea or loose stools.      . sertraline (ZOLOFT) 100 MG tablet Take 1.5 tablets (150 mg total) by mouth at bedtime.  45 tablet  5  . triamcinolone (NASACORT ALLERGY 24HR) 55 MCG/ACT AERO nasal inhaler Place 2 sprays into the nose daily as needed.      . potassium chloride SA (K-DUR,KLOR-CON) 20 MEQ tablet Take 1 tablet (20 mEq total) by mouth daily.  3 tablet  0   No current facility-administered medications on file prior to visit.     Past Medical History  Diagnosis Date  . Coronary artery disease     non obstructive  . Stroke   . Seizures   . Anxiety   . Depression   . Migraine     . Fibromuscular dysplasia   . Tobacco abuse   . COPD (chronic obstructive pulmonary disease)   . Arthritis of knee     Bilateral  . Tobacco abuse   . Migraine headache   . Anxiety disorder   . Depression   . Seizure disorder   . Cerebrovascular disease   . CAD (coronary artery disease)   . Automatic implantable cardiac defibrillator in situ     [employer] ICD  . HTN (hypertension)   . HLD (hyperlipidemia)   . Ventricular fibrillation   . Long QT syndrome   . Cardiac arrest   . Atrial fibrillation     a) 2D echo 09/11/12: LVEF 55-60%, mild LVH, mld LA dilatation  . Allergy      Past Surgical History  Procedure Laterality Date  . Pacemaker insertion  05/01/2003    ICD, implantation of a Guidant single chamber defibrillator, Doylene Canning. Ladona Ridgel MD  . Cholecystectomy    . Tubal ligation    . Breast lumpectomy    . Appendectomy    . Cosmetic surgery       Family History  Problem Relation Age of Onset  . Dementia Mother   . Stroke Mother     Multiple  . Aneurysm Sister     Brain  . Stroke Sister   .  Uterine cancer Other     Grandmother  . Colon cancer Sister   . Cancer Sister      History   Social History  . Marital Status: Divorced    Spouse Name: N/A    Number of Children: N/A  . Years of Education: N/A   Occupational History  . Disabled    Social History Main Topics  . Smoking status: Former Smoker -- 0.25 packs/day for 40 years    Types: Cigarettes  . Smokeless tobacco: Never Used     Comment: almost a pack a week  . Alcohol Use: No  . Drug Use: No  . Sexual Activity: No   Other Topics Concern  . Not on file   Social History Narrative   [place]   On disability for her osteoarthritis   Recently estranged from her husband      PHYSICAL EXAM   BP 140/80  Pulse 73  Ht 5\' 4"  (1.626 m)  Wt 170 lb 12.8 oz (77.474 kg)  BMI 29.30 kg/m2  SpO2 98% Constitutional: She is oriented to person, place, and time. She appears  well-developed and well-nourished. No distress.  HENT: No nasal discharge.  Head: Normocephalic and atraumatic.  Eyes: Pupils are equal and round. Right eye exhibits no discharge. Left eye exhibits no discharge.  Neck: Normal range of motion. Neck supple. No JVD present. No thyromegaly present.  Cardiovascular: Normal rate, regular rhythm, normal heart sounds. Exam reveals no gallop and no friction rub. No murmur heard.  Pulmonary/Chest: Effort normal and breath sounds normal. No stridor. No respiratory distress. She has no wheezes. She has no rales. She exhibits no tenderness.  Abdominal: Soft. Bowel sounds are normal. She exhibits no distension. There is no tenderness. There is no rebound and no guarding.  Musculoskeletal: Normal range of motion. She exhibits no edema and no tenderness.  Neurological: She is alert and oriented to person, place, and time. Coordination normal.  Skin: Skin is warm and dry. No rash noted. She is not diaphoretic. No erythema. No pallor.  Psychiatric: She has a normal mood and affect. Her behavior is normal. Judgment and thought content normal.      ASSESSMENT AND PLAN

## 2014-06-27 NOTE — Patient Instructions (Signed)
Your physician wants you to follow-up in: 6 MONTHS with Dr Kirke CorinArida. You will receive a reminder letter in the mail two months in advance. If you don't receive a letter, please call our office to schedule the follow-up appointment.  Your physician recommends that you continue on your current medications as directed. Please refer to the Current Medication list given to you today. You can take one extra Metoprolol Tartrate on a daily basis for break through Atrial Fibrillation.  One time prescription given for Diazepam (Valium)

## 2014-06-27 NOTE — Progress Notes (Signed)
ICD check in clinic. Normal device function. Thresholds and sensing consistent with previous device measurements. Impedance trends stable over time. 3 treated VT episodes--7 ATP therapy known. 478 nst episodes and 30 untreated episodes. Histogram distribution appropriate for patient and level of activity. No changes made this session. Device programmed at appropriate safety margins. Device programmed to optimize intrinsic conduction. Estimated longevity 11 years. ROV in 3 mths w/GT (overdue).

## 2014-06-27 NOTE — Assessment & Plan Note (Signed)
Continue to followup at the device clinic.

## 2014-06-27 NOTE — Assessment & Plan Note (Signed)
I discussed with her the importance of smoking cessation. 

## 2014-06-27 NOTE — Assessment & Plan Note (Signed)
She seems to be doing reasonably well overall. I asked her to take an extra dose of metoprolol as needed for breakthrough atrial fibrillation. Continue long-term anticoagulation.

## 2014-06-27 NOTE — Assessment & Plan Note (Signed)
She reports having an appointment with a new psychiatrist later this month. I gave her a one time refill on Valium and explained to her again that I cannot provide any more. She feels that the eighth of fibrillation is due to anxiety.

## 2014-07-20 ENCOUNTER — Emergency Department (HOSPITAL_COMMUNITY)
Admission: EM | Admit: 2014-07-20 | Discharge: 2014-07-21 | Disposition: A | Discharge status: Home / Self Care | Payer: Self-pay | Attending: Emergency Medicine | Admitting: Emergency Medicine

## 2014-07-20 ENCOUNTER — Emergency Department (HOSPITAL_COMMUNITY): Payer: Self-pay

## 2014-07-20 ENCOUNTER — Encounter (HOSPITAL_COMMUNITY): Payer: Self-pay | Admitting: Emergency Medicine

## 2014-07-20 DIAGNOSIS — Z7901 Long term (current) use of anticoagulants: Secondary | ICD-10-CM | POA: Insufficient documentation

## 2014-07-20 DIAGNOSIS — Z8673 Personal history of transient ischemic attack (TIA), and cerebral infarction without residual deficits: Secondary | ICD-10-CM | POA: Insufficient documentation

## 2014-07-20 DIAGNOSIS — F419 Anxiety disorder, unspecified: Secondary | ICD-10-CM | POA: Insufficient documentation

## 2014-07-20 DIAGNOSIS — Z95810 Presence of automatic (implantable) cardiac defibrillator: Secondary | ICD-10-CM | POA: Insufficient documentation

## 2014-07-20 DIAGNOSIS — I2510 Atherosclerotic heart disease of native coronary artery without angina pectoris: Secondary | ICD-10-CM | POA: Insufficient documentation

## 2014-07-20 DIAGNOSIS — Z87891 Personal history of nicotine dependence: Secondary | ICD-10-CM | POA: Insufficient documentation

## 2014-07-20 DIAGNOSIS — F329 Major depressive disorder, single episode, unspecified: Secondary | ICD-10-CM | POA: Insufficient documentation

## 2014-07-20 DIAGNOSIS — I10 Essential (primary) hypertension: Secondary | ICD-10-CM | POA: Insufficient documentation

## 2014-07-20 DIAGNOSIS — Z79899 Other long term (current) drug therapy: Secondary | ICD-10-CM | POA: Insufficient documentation

## 2014-07-20 DIAGNOSIS — J449 Chronic obstructive pulmonary disease, unspecified: Secondary | ICD-10-CM | POA: Insufficient documentation

## 2014-07-20 DIAGNOSIS — Z8674 Personal history of sudden cardiac arrest: Secondary | ICD-10-CM | POA: Insufficient documentation

## 2014-07-20 DIAGNOSIS — I480 Paroxysmal atrial fibrillation: Secondary | ICD-10-CM | POA: Insufficient documentation

## 2014-07-20 LAB — CBC
HCT: 40 % (ref 36.0–46.0)
Hemoglobin: 14.1 g/dL (ref 12.0–15.0)
MCH: 32.3 pg (ref 26.0–34.0)
MCHC: 35.3 g/dL (ref 30.0–36.0)
MCV: 91.7 fL (ref 78.0–100.0)
Platelets: 212 10*3/uL (ref 150–400)
RBC: 4.36 MIL/uL (ref 3.87–5.11)
RDW: 12.7 % (ref 11.5–15.5)
WBC: 8.6 10*3/uL (ref 4.0–10.5)

## 2014-07-20 LAB — COMPREHENSIVE METABOLIC PANEL
ALK PHOS: 85 U/L (ref 39–117)
ALT: 9 U/L (ref 0–35)
AST: 15 U/L (ref 0–37)
Albumin: 3.7 g/dL (ref 3.5–5.2)
Anion gap: 13 (ref 5–15)
BUN: 21 mg/dL (ref 6–23)
CALCIUM: 9.9 mg/dL (ref 8.4–10.5)
CO2: 25 mEq/L (ref 19–32)
Chloride: 103 mEq/L (ref 96–112)
Creatinine, Ser: 1.37 mg/dL — ABNORMAL HIGH (ref 0.50–1.10)
GFR calc Af Amer: 46 mL/min — ABNORMAL LOW (ref >90)
GFR calc non Af Amer: 40 mL/min — ABNORMAL LOW (ref >90)
GLUCOSE: 104 mg/dL — AB (ref 70–99)
POTASSIUM: 3.7 meq/L (ref 3.7–5.3)
SODIUM: 141 meq/L (ref 137–147)
Total Bilirubin: <0.2 mg/dL — ABNORMAL LOW (ref 0.3–1.2)
Total Protein: 7 g/dL (ref 6.0–8.3)

## 2014-07-20 LAB — PROTIME-INR
INR: 2.43 — ABNORMAL HIGH (ref 0.00–1.49)
Prothrombin Time: 26.6 seconds — ABNORMAL HIGH (ref 11.6–15.2)

## 2014-07-20 LAB — I-STAT TROPONIN, ED: Troponin i, poc: 0.01 ng/mL (ref 0.00–0.08)

## 2014-07-20 MED ORDER — ONDANSETRON 4 MG PO TBDP
4.0000 mg | ORAL_TABLET | Freq: Once | ORAL | Status: AC
  Administered 2014-07-20: 4 mg via ORAL
  Filled 2014-07-20: qty 1

## 2014-07-20 MED ORDER — SODIUM CHLORIDE 0.9 % IV SOLN: 1000.0000 mL | INTRAVENOUS | Status: DC

## 2014-07-20 NOTE — ED Provider Notes (Signed)
CSN: [id number]     Arrival date & time 07/20/14  2110 History   First MD Initiated Contact with Patient 07/20/14 2114     Chief Complaint  Patient presents with  . Weakness  . Atrial Fibrillation   HPI Pt has history of paroxysmal atrial fibrillation.  This evening about 2 hours ago she started to feel weak.  She thought she might need to eat something but it did not help.  She started developing a head and sore throat.    She continues to feel weak so she called EMS.   She is not having any chest pain.  No shortness of breath.  NO vomiting or diarrhea.  EMS noticed that she was in a fib again Past Medical History  Diagnosis Date  . Coronary artery disease     non obstructive  . Stroke   . Seizures   . Anxiety   . Depression   . Migraine   . Fibromuscular dysplasia   . Tobacco abuse   . COPD (chronic obstructive pulmonary disease)   . Arthritis of knee     Bilateral  . Tobacco abuse   . Migraine headache   . Anxiety disorder   . Depression   . Seizure disorder   . Cerebrovascular disease   . CAD (coronary artery disease)   . Automatic implantable cardiac defibrillator in situ     [employer] ICD  . HTN (hypertension)   . HLD (hyperlipidemia)   . Ventricular fibrillation   . Long QT syndrome   . Cardiac arrest   . Atrial fibrillation     a) 2D echo 09/11/12: LVEF 55-60%, mild LVH, mld LA dilatation  . Allergy    Past Surgical History  Procedure Laterality Date  . Pacemaker insertion  05/01/2003    ICD, implantation of a Guidant single chamber defibrillator, Doylene CanningGregg W. Ladona Ridgelaylor MD  . Cholecystectomy    . Tubal ligation    . Breast lumpectomy    . Appendectomy    . Cosmetic surgery     Family History  Problem Relation Age of Onset  . Dementia Mother   . Stroke Mother     Multiple  . Aneurysm Sister     Brain  . Stroke Sister   . Uterine cancer Other     Grandmother  . Colon cancer Sister   . Cancer Sister    History  Substance Use Topics  . Smoking  status: Former Smoker -- 0.25 packs/day for 40 years    Types: Cigarettes  . Smokeless tobacco: Never Used     Comment: almost a pack a week  . Alcohol Use: No   OB History   Grav Para Term Preterm Abortions TAB SAB Ect Mult Living                 Review of Systems  All other systems reviewed and are negative.     Allergies  Azithromycin; Ibuprofen; Codeine; and Demerol  Home Medications   Prior to Admission medications   Medication Sig Start Date End Date Taking? Authorizing Provider  albuterol (PROVENTIL HFA;VENTOLIN HFA) 108 (90 BASE) MCG/ACT inhaler Inhale 2 puffs into the lungs every 6 (six) hours as needed for wheezing.   Yes Historical Provider, MD  diazepam (VALIUM) 5 MG tablet Take 1 tablet (5 mg total) by mouth every 12 (twelve) hours as needed for anxiety. 06/27/14  Yes Iran OuchMuhammad A Arida, MD  diphenhydrAMINE (BENADRYL) 25 MG tablet Take 50 mg by mouth every  6 (six) hours as needed for allergies or sleep.    Yes Historical Provider, MD  loperamide (IMODIUM A-D) 2 MG tablet Take 2 mg by mouth 4 (four) times daily as needed for diarrhea or loose stools.   Yes Historical Provider, MD  metoprolol tartrate (LOPRESSOR) 25 MG tablet Take 25 mg by mouth 2 (two) times daily. Take one tablet by mouth twice a day, you can take one extra tablet daily as needed for break through Afib 06/27/14  Yes Iran Ouch, MD  potassium chloride SA (K-DUR,KLOR-CON) 20 MEQ tablet Take 1 tablet (20 mEq total) by mouth daily. 05/31/14  Yes Audree Camel, MD  Rivaroxaban (XARELTO) 15 MG TABS tablet Take 1 tablet (15 mg total) by mouth at bedtime. 06/27/14  Yes Iran Ouch, MD  sertraline (ZOLOFT) 100 MG tablet Take 200 mg by mouth daily.   Yes Historical Provider, MD  simvastatin (ZOCOR) 20 MG tablet Take 1 tablet (20 mg total) by mouth at bedtime. 06/27/14  Yes Iran Ouch, MD  triamcinolone (NASACORT ALLERGY 24HR) 55 MCG/ACT AERO nasal inhaler Place 2 sprays into the nose daily as needed.    Yes Historical Provider, MD   BP 92/71  Pulse 66  Temp(Src) 98.1 F (36.7 C) (Oral)  Resp 12  Ht 5\' 4"  (1.626 m)  Wt 163 lb (73.936 kg)  BMI 27.97 kg/m2  SpO2 96% Physical Exam  Nursing note and vitals reviewed. Constitutional: She appears well-developed and well-nourished. No distress.  HENT:  Head: Normocephalic and atraumatic.  Right Ear: External ear normal.  Left Ear: External ear normal.  Eyes: Conjunctivae are normal. Right eye exhibits no discharge. Left eye exhibits no discharge. No scleral icterus.  Neck: Neck supple. No tracheal deviation present.  Cardiovascular: Normal rate and intact distal pulses.  An irregularly irregular rhythm present.  Pulmonary/Chest: Effort normal and breath sounds normal. No stridor. No respiratory distress. She has no wheezes. She has no rales.  Abdominal: Soft. Bowel sounds are normal. She exhibits no distension. There is no tenderness. There is no rebound and no guarding.  Musculoskeletal: She exhibits no edema and no tenderness.  Neurological: She is alert. She has normal strength. No cranial nerve deficit (no facial droop, extraocular movements intact, no slurred speech) or sensory deficit. She exhibits normal muscle tone. She displays no seizure activity. Coordination normal.  Skin: Skin is warm and dry. No rash noted.  Psychiatric: She has a normal mood and affect.    ED Course  Procedures (including critical care time) Labs Review Labs Reviewed  COMPREHENSIVE METABOLIC PANEL - Abnormal; Notable for the following:    Glucose, Bld 104 (*)    Creatinine, Ser 1.37 (*)    Total Bilirubin <0.2 (*)    GFR calc non Af Amer 40 (*)    GFR calc Af Amer 46 (*)    All other components within normal limits  PROTIME-INR - Abnormal; Notable for the following:    Prothrombin Time 26.6 (*)    INR 2.43 (*)    All other components within normal limits  CBC  I-STAT TROPOININ, ED    Imaging Review Dg Chest Portable 1 View  07/20/2014    CLINICAL DATA:  Weakness, atrial fibrillation  EXAM: PORTABLE CHEST - 1 VIEW  COMPARISON:  DG CHEST 2 VIEW dated 06/19/2014  FINDINGS: There is no focal parenchymal opacity, pleural effusion, or pneumothorax. There is stable cardiomegaly. There is single lead cardiac pacer.  There is osteoarthritis of the left glenohumeral  joint.  IMPRESSION: No active disease.   Electronically Signed   By: Elige KoHetal  Patel   On: 07/20/2014 22:34     EKG Interpretation   Date/Time:  Thursday July 20 2014 21:18:28 EDT Ventricular Rate:  105 PR Interval:    QRS Duration: 89 QT Interval:  351 QTC Calculation: 464 R Axis:   65 Text Interpretation:  Atrial fibrillation Low voltage, precordial leads  Borderline repolarization abnormality atrial fibrillation is new since  prior ECG Confirmed by Sarvesh Meddaugh  MD-J, Sanye Ledesma ([id number]) on 07/20/2014 9:23:55 PM      MDM   Final diagnoses:  Paroxysmal atrial fibrillation    The patient's laboratory tests are reassuring. While in the emergency room she converted back to normal sinus rhythm. Heart rate is now in the 60s and she has a normal sinus rhythm on the monitor.  Patient has known history of paroxysmal atrial fibrillation. She had another episode this evening with spontaneous resolution back into sinus rhythm.  At this time there does not appear to be any evidence of an acute emergency medical condition and the patient appears stable for discharge with appropriate outpatient follow up.    Linwood DibblesJon Darci Lykins, MD 07/20/14 [phone]

## 2014-07-20 NOTE — ED Notes (Signed)
Per EMS: pt from home with complaint of weakness and lightheadedness that started 2 hours ago.  Pt stated weakness started first and then she developed a headache which progressed into a sore throat.  Pt sts when she typically gets sore throats she's in a fib; a fib noted on the monitor by EMS.  Afib controlled with Xarelto.  Rate 80 -lower 100's with EMS.  Pt initially hypotensive with EMS at 94/54, last BP 116/70.

## 2014-07-20 NOTE — Discharge Instructions (Signed)

## 2014-07-31 ENCOUNTER — Ambulatory Visit: Payer: Self-pay | Admitting: Family Medicine

## 2014-08-22 ENCOUNTER — Encounter: Payer: Self-pay | Admitting: *Deleted

## 2014-10-26 ENCOUNTER — Encounter: Payer: Self-pay | Admitting: *Deleted

## 2014-11-16 ENCOUNTER — Encounter: Payer: Self-pay | Admitting: *Deleted

## 2014-12-16 ENCOUNTER — Emergency Department (HOSPITAL_COMMUNITY): Payer: Self-pay

## 2014-12-16 ENCOUNTER — Encounter (HOSPITAL_COMMUNITY): Payer: Self-pay

## 2014-12-16 ENCOUNTER — Emergency Department (HOSPITAL_COMMUNITY)
Admission: EM | Admit: 2014-12-16 | Discharge: 2014-12-16 | Disposition: A | Discharge status: Home / Self Care | Payer: Self-pay | Attending: Emergency Medicine | Admitting: Emergency Medicine

## 2014-12-16 DIAGNOSIS — Z87891 Personal history of nicotine dependence: Secondary | ICD-10-CM | POA: Insufficient documentation

## 2014-12-16 DIAGNOSIS — W19XXXA Unspecified fall, initial encounter: Secondary | ICD-10-CM

## 2014-12-16 DIAGNOSIS — G40909 Epilepsy, unspecified, not intractable, without status epilepticus: Secondary | ICD-10-CM | POA: Insufficient documentation

## 2014-12-16 DIAGNOSIS — IMO0002 Reserved for concepts with insufficient information to code with codable children: Secondary | ICD-10-CM

## 2014-12-16 DIAGNOSIS — Z8673 Personal history of transient ischemic attack (TIA), and cerebral infarction without residual deficits: Secondary | ICD-10-CM | POA: Insufficient documentation

## 2014-12-16 DIAGNOSIS — Z79899 Other long term (current) drug therapy: Secondary | ICD-10-CM | POA: Insufficient documentation

## 2014-12-16 DIAGNOSIS — F419 Anxiety disorder, unspecified: Secondary | ICD-10-CM | POA: Insufficient documentation

## 2014-12-16 DIAGNOSIS — Z23 Encounter for immunization: Secondary | ICD-10-CM | POA: Insufficient documentation

## 2014-12-16 DIAGNOSIS — Y939 Activity, unspecified: Secondary | ICD-10-CM | POA: Insufficient documentation

## 2014-12-16 DIAGNOSIS — Y929 Unspecified place or not applicable: Secondary | ICD-10-CM | POA: Insufficient documentation

## 2014-12-16 DIAGNOSIS — Z95810 Presence of automatic (implantable) cardiac defibrillator: Secondary | ICD-10-CM | POA: Insufficient documentation

## 2014-12-16 DIAGNOSIS — E785 Hyperlipidemia, unspecified: Secondary | ICD-10-CM | POA: Insufficient documentation

## 2014-12-16 DIAGNOSIS — S01112A Laceration without foreign body of left eyelid and periocular area, initial encounter: Secondary | ICD-10-CM | POA: Insufficient documentation

## 2014-12-16 DIAGNOSIS — W1839XA Other fall on same level, initial encounter: Secondary | ICD-10-CM | POA: Insufficient documentation

## 2014-12-16 DIAGNOSIS — I10 Essential (primary) hypertension: Secondary | ICD-10-CM | POA: Insufficient documentation

## 2014-12-16 DIAGNOSIS — F329 Major depressive disorder, single episode, unspecified: Secondary | ICD-10-CM | POA: Insufficient documentation

## 2014-12-16 DIAGNOSIS — J449 Chronic obstructive pulmonary disease, unspecified: Secondary | ICD-10-CM | POA: Insufficient documentation

## 2014-12-16 DIAGNOSIS — I2510 Atherosclerotic heart disease of native coronary artery without angina pectoris: Secondary | ICD-10-CM | POA: Insufficient documentation

## 2014-12-16 DIAGNOSIS — S8992XA Unspecified injury of left lower leg, initial encounter: Secondary | ICD-10-CM | POA: Insufficient documentation

## 2014-12-16 DIAGNOSIS — Y999 Unspecified external cause status: Secondary | ICD-10-CM | POA: Insufficient documentation

## 2014-12-16 MED ORDER — LIDOCAINE-EPINEPHRINE 1 %-1:100000 IJ SOLN
10.0000 mL | Freq: Once | INTRAMUSCULAR | Status: AC
  Administered 2014-12-16: 10 mL
  Filled 2014-12-16: qty 1

## 2014-12-16 MED ORDER — TETANUS-DIPHTH-ACELL PERTUSSIS 5-2.5-18.5 LF-MCG/0.5 IM SUSP
0.5000 mL | Freq: Once | INTRAMUSCULAR | Status: AC
  Administered 2014-12-16: 0.5 mL via INTRAMUSCULAR
  Filled 2014-12-16: qty 0.5

## 2014-12-16 MED ORDER — HYDROCODONE-ACETAMINOPHEN 5-325 MG PO TABS
1.0000 | ORAL_TABLET | Freq: Once | ORAL | Status: AC
  Administered 2014-12-16: 1 via ORAL
  Filled 2014-12-16: qty 1

## 2014-12-16 NOTE — Discharge Instructions (Signed)
Absorbable Suture Repair °Absorbable sutures (stitches) hold skin together so you can heal. Keep skin wounds clean and dry for the next 2 to 3 days. Then, you may gently wash your wound and dress it with an antibiotic ointment as recommended. As your wound begins to heal, the sutures are no longer needed, and they typically begin to fall off. This will take 7 to 10 days. After 10 days, if your sutures are loose, you can remove them by wiping with a clean gauze pad or a cotton ball. Do not pull your sutures out. They should wipe away easily. If after 10 days they do not easily wipe away, have your caregiver take them out. Absorbable sutures may be used deep in a wound to help hold it together. If these stitches are below the skin, the body will absorb them completely in 3 to 4 weeks.  °You may need a tetanus shot if: °· You cannot remember when you had your last tetanus shot. °· You have never had a tetanus shot. °If you get a tetanus shot, your arm may swell, get red, and feel warm to the touch. This is common and not a problem. If you need a tetanus shot and you choose not to have one, there is a rare chance of getting tetanus. Sickness from tetanus can be serious. °SEEK IMMEDIATE MEDICAL CARE IF: °· You have redness in the wound area. °· The wound area feels hot to the touch. °· You develop swelling in the wound area. °· You develop pain. °· There is fluid drainage from the wound. °Document Released: 10/16/2004 Document Revised: 12/01/2011 Document Reviewed: 01/28/2011 °[employer]® Patient Information ©2015 [employer]. This information is not intended to replace advice given to you by your health care provider. Make sure you discuss any questions you have with your health care provider. ° °

## 2014-12-16 NOTE — ED Notes (Signed)
Suture cart at bedside 

## 2014-12-16 NOTE — ED Provider Notes (Signed)
CSN: [id number]     Arrival date & time 12/16/14  1746 History   First MD Initiated Contact with Patient 12/16/14 1750     Chief Complaint  Patient presents with  . Fall     (Consider location/radiation/quality/duration/timing/severity/associated sxs/prior Treatment) Patient is a 64 y.o. female presenting with fall.  Fall This is a new problem. The current episode started today. The problem occurs rarely. Associated symptoms include headaches. Pertinent negatives include no abdominal pain, chest pain, chills, coughing, fever, nausea, numbness, rash, vomiting or weakness. Nothing aggravates the symptoms. She has tried nothing for the symptoms.    Past Medical History  Diagnosis Date  . Coronary artery disease     non obstructive  . Stroke   . Seizures   . Anxiety   . Depression   . Migraine   . Fibromuscular dysplasia   . Tobacco abuse   . COPD (chronic obstructive pulmonary disease)   . Arthritis of knee     Bilateral  . Tobacco abuse   . Migraine headache   . Anxiety disorder   . Depression   . Seizure disorder   . Cerebrovascular disease   . CAD (coronary artery disease)   . Automatic implantable cardiac defibrillator in situ     [employer] ICD  . HTN (hypertension)   . HLD (hyperlipidemia)   . Ventricular fibrillation   . Long QT syndrome   . Cardiac arrest   . Atrial fibrillation     a) 2D echo 09/11/12: LVEF 55-60%, mild LVH, mld LA dilatation  . Allergy    Past Surgical History  Procedure Laterality Date  . Pacemaker insertion  05/01/2003    ICD, implantation of a Guidant single chamber defibrillator, Doylene Canning. Ladona Ridgel MD  . Cholecystectomy    . Tubal ligation    . Breast lumpectomy    . Appendectomy    . Cosmetic surgery     Family History  Problem Relation Age of Onset  . Dementia Mother   . Stroke Mother     Multiple  . Aneurysm Sister     Brain  . Stroke Sister   . Uterine cancer Other     Grandmother  . Colon cancer Sister   . Cancer  Sister    History  Substance Use Topics  . Smoking status: Former Smoker -- 0.25 packs/day for 40 years    Types: Cigarettes  . Smokeless tobacco: Never Used     Comment: almost a pack a week  . Alcohol Use: No   OB History    No data available     Review of Systems  Constitutional: Negative for fever and chills.  HENT: Negative for nosebleeds.   Eyes: Negative for visual disturbance.  Respiratory: Negative for cough and shortness of breath.   Cardiovascular: Negative for chest pain.  Gastrointestinal: Negative for nausea, vomiting, abdominal pain, diarrhea and constipation.  Genitourinary: Negative for dysuria.  Skin: Negative for rash.  Neurological: Positive for headaches. Negative for weakness and numbness.  All other systems reviewed and are negative.     Allergies  Azithromycin; Ibuprofen; Codeine; and Demerol  Home Medications   Prior to Admission medications   Medication Sig Start Date End Date Taking? Authorizing Provider  acetaminophen (TYLENOL) 500 MG tablet Take 500 mg by mouth every 6 (six) hours as needed for headache.   Yes Historical Provider, MD  diazepam (VALIUM) 5 MG tablet Take 1 tablet (5 mg total) by mouth every 12 (twelve) hours as needed  for anxiety. Patient taking differently: Take 5 mg by mouth 3 (three) times daily.  06/27/14  Yes Iran Ouch, MD  diphenhydrAMINE (BENADRYL) 25 MG tablet Take 50 mg by mouth every 6 (six) hours as needed for allergies or sleep.    Yes Historical Provider, MD  diphenhydramine-acetaminophen (TYLENOL PM) 25-500 MG TABS Take 1 tablet by mouth at bedtime as needed (sleep).   Yes Historical Provider, MD  loperamide (IMODIUM A-D) 2 MG tablet Take 2 mg by mouth 4 (four) times daily as needed for diarrhea or loose stools.   Yes Historical Provider, MD  metoprolol tartrate (LOPRESSOR) 25 MG tablet Take 25 mg by mouth 2 (two) times daily. Take one tablet by mouth twice a day, you can take one or two extra tablets daily as  needed for break through Afib 06/27/14  Yes Iran Ouch, MD  mirtazapine (REMERON) 15 MG tablet Take 15 mg by mouth 3 times/day as needed-between meals & bedtime (sleep).  10/20/14  Yes Historical Provider, MD  Rivaroxaban (XARELTO) 15 MG TABS tablet Take 1 tablet (15 mg total) by mouth at bedtime. 06/27/14  Yes Iran Ouch, MD  sertraline (ZOLOFT) 100 MG tablet Take 200 mg by mouth at bedtime.    Yes Historical Provider, MD  simvastatin (ZOCOR) 20 MG tablet Take 1 tablet (20 mg total) by mouth at bedtime. 06/27/14  Yes Iran Ouch, MD  albuterol (PROVENTIL HFA;VENTOLIN HFA) 108 (90 BASE) MCG/ACT inhaler Inhale 2 puffs into the lungs every 6 (six) hours as needed for wheezing.    Historical Provider, MD  potassium chloride SA (K-DUR,KLOR-CON) 20 MEQ tablet Take 1 tablet (20 mEq total) by mouth daily. Patient not taking: Reported on 12/16/2014 05/31/14   Pricilla Loveless, MD  triamcinolone (NASACORT ALLERGY 24HR) 55 MCG/ACT AERO nasal inhaler Place 2 sprays into the nose daily as needed.    Historical Provider, MD   BP 126/65 mmHg  Pulse 64  Temp(Src) 98.3 F (36.8 C) (Oral)  Resp 17  SpO2 96% Physical Exam  Constitutional: She is oriented to person, place, and time. No distress.  HENT:  Head: Normocephalic and atraumatic.  2cm laceration over the left eyebrow.    EOMI  Eyes: EOM are normal. Pupils are equal, round, and reactive to light.  Neck: Normal range of motion. Neck supple.  Cardiovascular: Normal rate and intact distal pulses.   Pulmonary/Chest: No respiratory distress.  Abdominal: Soft. There is no tenderness.  Musculoskeletal: Normal range of motion.  Mild ttp around the L knee.  No obvious swelling.  L foot is NVI  Neurological: She is alert and oriented to person, place, and time. She has normal strength. No cranial nerve deficit or sensory deficit. Coordination and gait normal.  Skin: No rash noted. She is not diaphoretic.  Psychiatric: She has a normal mood and  affect.    ED Course  LACERATION REPAIR Date/Time: 12/17/2014 2:30 AM Performed by: Silas Flood Authorized by: Silas Flood Consent: Verbal consent obtained. Risks and benefits: risks, benefits and alternatives were discussed Consent given by: patient Required items: required blood products, implants, devices, and special equipment available Patient identity confirmed: verbally with patient Body area: head/neck Location details: left eyebrow Laceration length: 2 cm Foreign bodies: no foreign bodies Tendon involvement: none Nerve involvement: none Vascular damage: no Anesthesia: local infiltration Local anesthetic: lidocaine 1% with epinephrine Anesthetic total: 3 ml Preparation: Patient was prepped and draped in the usual sterile fashion. Irrigation solution: saline Irrigation method: jet lavage Amount  of cleaning: standard Debridement: none Degree of undermining: none Number of sutures: 3 Technique: simple Approximation: close Approximation difficulty: simple Comments: Closed with 5.0 vicryl rapide   (including critical care time) Labs Review Labs Reviewed - No data to display  Imaging Review Ct Head Wo Contrast  12/16/2014   CLINICAL DATA:  Patient status post fall. No reported loss of consciousness.  EXAM: CT HEAD WITHOUT CONTRAST  TECHNIQUE: Contiguous axial images were obtained from the base of the skull through the vertex without intravenous contrast.  COMPARISON:  Brain CT 01/06/2014  FINDINGS: Ventricles and sulci are appropriate for patient's age. Periventricular and subcortical white matter hypodensity compatible with chronic small vessel ischemic change. No evidence for acute cortically based infarct, intracranial hemorrhage, mass lesion or mass effect. Paranasal sinuses are unremarkable. Mastoid air cells are unremarkable. Calvarium is intact.  IMPRESSION: Chronic small vessel ischemic change.  No acute intracranial process.   Electronically Signed   By: Annia Beltrew   Davis M.D.   On: 12/16/2014 19:26     EKG Interpretation None      MDM   Final diagnoses:  None    64 y/o female w/o PMH sig for being on xarelto for afib who comes in after a mechanical fall.  She was walking on on sidewalk which was uneven and tripped.  No LOC.    Complaining of headache.  No other complaints other than some mild left knee pain.  She has chronic knee pain.  Exam as above.  There is no obvious swelling of the L knee.  She is able to stand on the L knee w/o difficulty or pain.  No concern for frx.   Neuro exam intact as documented above.  Small lac over left eyebrow which I have closed with vicryl rapide as documented above.  Given head trauma and on xarelto, head CT obtained which was WNL. C/T/L spine non-tender, chest wall non-tender.  No abdominal ttp.  VSS  Feel safe for d/c at this time.  I have discussed the results, Dx and Tx plan with the patient. They expressed understanding and agree with the plan and were told to return to ED with any worsening of condition or concern.    Disposition: Discharge  Condition: Good  Discharge Medication List as of 12/16/2014  9:04 PM      Follow Up: [facility] [address] [id number] [place] [id number]  If symptoms worsen   Pt seen in conjunction with Dr. Roanna Banningnanavati     Delson Dulworth, MD 12/17/14 [id number]  Derwood KaplanAnkit Nanavati, MD 12/17/14 1606

## 2014-12-16 NOTE — ED Notes (Signed)
Pt states that she is having R knee pain MD aware

## 2014-12-16 NOTE — ED Notes (Signed)
Pt. Larey SeatFell in the parking lot of [employer] this evening. No LOC. Pt. Did hit head. Pt. Is on xarelto for afib. Pt. Has hematoma and laceration to L eye. Pt in c-collar for precaution, passed EMS spinal clearance. Pt. Complaining of headache at this time.

## 2014-12-16 NOTE — ED Notes (Signed)
Patient transported to CT 

## 2014-12-21 ENCOUNTER — Encounter: Payer: Self-pay | Admitting: *Deleted

## 2015-01-14 ENCOUNTER — Telehealth: Payer: Self-pay | Admitting: Cardiology

## 2015-01-14 MED ORDER — RIVAROXABAN 15 MG PO TABS: 15.0000 mg | ORAL_TABLET | Freq: Every day | ORAL | Status: DC

## 2015-01-14 MED ORDER — METOPROLOL TARTRATE 25 MG PO TABS: 25.0000 mg | ORAL_TABLET | Freq: Two times a day (BID) | ORAL | Status: DC

## 2015-01-14 NOTE — H&P (Signed)
PATIENT NAME:  [name], [name] MR#:  [id number] DATE OF BIRTH:  [birth year]  DATE OF ADMISSION:  01/05/2012  REFERRING PHYSICIAN: Dr. Jens Fowler   PRIMARY PHYSICIAN: None.  PRIMARY CARDIOLOGIST: Dr. Lewayne BuntingGregg Fowler with [facility]    PRESENTING COMPLAINT: Palpitations, heart racing, shortness of breath, chest pain.   HISTORY OF PRESENT ILLNESS: [name] is a 64 year old woman with history of chronic atrial fibrillation, V. fibrillation arrest status post defibrillator placement, depression, anxiety, hyperlipidemia, hypertension, and history of noncompliance who presents with reports of recurrent episode of palpitations and heart racing. She states that she is aware when she goes into atrial fibrillation. She endorses presyncope but that is unchanged for her whole lifetime since she was a child. She endorsed chest pain in the center of her chest, no radiation, and also pain in between her shoulder blades. She reports also associated shortness of breath. No nausea or vomiting. Reports currently her symptoms have resolved but is still having some ongoing headache and congestion. She denies any lower extremity edema.   PAST MEDICAL HISTORY:  1. Chronic atrial fibrillation, on aspirin alone.  2. History of V. fibrillation arrest, status post defibrillator placement.  3. Depression/anxiety. 4. Ovarian cyst.  5. Hyperlipidemia.  6. Hypertension.  7. Questionable bicuspid aortic valve.  8. Seizure disorder. Per patient, last one was 40 years ago. Not on medications currently.   PAST SURGICAL HISTORY:  1. Right breast lumpectomy. 2. Cholecystectomy.  3. Defibrillator placement followed by generator change.   ALLERGIES: Advil, codeine, Demerol, mycins.   MEDICATIONS:  1. Aspirin 325 mg daily.  2. Valium 10 mg t.i.d. as needed. Has been off this for the past two weeks.  3. Paxil 60 mg daily.  4. Metoprolol tartrate 25 mg b.i.d.  5. Zocor 40 mg at bedtime.  6. Metoprolol 25 mg as needed for  breakthrough atrial fibrillation and palpitations.  7. Benadryl 25 mg daily as needed.   SOCIAL HISTORY: Recent divorce from her husband that's been finalized since this past Tuesday. She is currently living with her daughter but has been having ongoing stressors where she's lost her apartment due to financial issues. She was previously on disability but is awaiting Widow's benefit. She quit tobacco in January of 2013. No alcohol or drug use.   FAMILY HISTORY: Sister with aneurysm. Her other sister is with mental retardation and colon cancer. Her mother had dementia.   REVIEW OF SYSTEMS: CONSTITUTIONAL: No fevers. She endorses nausea. EYES: Reports heaviness over her eyes and sinus congestion and pressure. ENT: No epistaxis or discharge. RESPIRATORY: No cough or hemoptysis, wheezing. She endorses shortness of breath as per history of present illness. CARDIOVASCULAR: As per history of present illness. No edema or syncope. GI: She endorses nausea but no vomiting, diarrhea, abdominal pain, hematemesis, or melena. GU: No dysuria or hematuria. ENDOCRINE: No polyuria or polydipsia. HEME: She has easy bruising. No bleeding. SKIN: No ulcers. NEUROLOGIC: No one-sided weakness or numbness. PSYCHIATRIC: She endorses anxiety but no suicidal ideation.   PHYSICAL EXAMINATION:   VITAL SIGNS: Temperature 98.1, initial pulse in the 130's, current pulse in the 60's to 70's, initially respiratory rate 26, currently 18, blood pressure 118/82, sating at 97% on room air.   GENERAL: Lying in bed in no apparent distress.   HEENT: Normocephalic, atraumatic. Pupils equal, symmetric, nonicteric. Nares without discharge. Moist mucous membrane.   NECK: Soft and supple. No adenopathy or JVP.   CARDIOVASCULAR: Non-tachy. No murmurs, rubs, or gallops.   LUNGS: Clear to auscultation bilaterally. No  use of accessory muscles or increased respiratory effort.   ABDOMEN: Soft. Positive bowel sounds. No mass appreciated.    EXTREMITIES: No edema. Dorsal pedis pulses intact.   SKIN: She has ecchymosis on superior foot with some excoriation.   PSYCH: She is alert and oriented. The patient is cooperative.   PERTINENT LABS AND STUDIES: Troponin less than 0.02. WBC 10.8, hemoglobin 13.6, hematocrit 40.1, platelets 284, MCV 96, glucose 133, BUN 17, creatinine 1.28, sodium 138, potassium 3.2, chloride 102, carbon dioxide 24, calcium 9.8. LFTs within normal limits. INR 1.0. PTT 31.8. TSH 1.35.   EKG initially atrial fibrillation, RVR, rate of 132. No ST changes.   Chest x-ray without any acute findings.   ASSESSMENT AND PLAN: [name] is a pleasant 64 year old woman with history of atrial fibrillation, hypertension, hyperlipidemia, depression, anxiety, V. fibrillation status post defibrillator, and seizure disorder presenting with palpitations, racing heart, chest pain, shortness of breath, and presyncope.  1. Atrial fibrillation, RVR, reports compliance with aspirin and beta-blocker, also reportedly decrease in her dose of metoprolol to 25 b.i.d. TSH is within normal limits. Replace her K. Send mag. Send a urinalysis. Continue tele. Cycle cardiac enzymes. Increase metoprolol back to 50 mg b.i.d. She has now converted back to normal sinus after receiving 20 mg of IV diltiazem and has sustained normal sinus and her symptoms have resolved with her chest pain and shortness of breath. Hyperlipidemia has been also noncompliant due to finances. Check fasting lipid panel.  2. Depression/anxiety with increasing anxiety and panic disorder. The patient apparently self discontinued Valium about two weeks ago but is not interested in restarting. Obtain psychiatric consultation for medication recommendations as she reports that her anxiety has been worsening.  3. Prophylaxis with aspirin and Lovenox.   TIME SPENT: Approximately 45 minutes spent on patient care.   ____________________________ Brittney Derby,  MD ap:drc D: 01/05/2012 05:48:00 ET T: 01/05/2012 07:32:53 ET JOB#: [id number]  cc: Brittney Brownie Dovber Ernest, MD, <Dictator> Brittney Derby MD ELECTRONICALLY SIGNED 01/18/2012 1:58

## 2015-01-14 NOTE — Consult Note (Signed)
General Aspect [name] Is a 64 year old woman with history of  VF arrest status post ICD insertion, With recent generator change by Dr. Caryl Comes,  history of VT, history of atrial fibrillation she reports over the past year with at least 5-10 episodes lasting for several hours at a time, converting typically on their own, recently moved from [place] to the area, evaluated several times in the past for atrial fib (has not taken medication as needed for atrial fibrillation episodes).  She denies chest pain or syncope or any ICD shocks.  She has been placed on ASA.  She presents with atrial fibrillation. Cardiology was consulted for atrial fibrillation.  She reports she is in between [place], switching to medicare. She ran out of her metoprolol succinate and was unable to afford the succinate. She missed three days of medications. She does have medicare supplemental [employer].  She felt palpitations/tachycardia starting yesterday afternoon. She has not had recent episodes. She usually has epsiodes when she forgets her metoprolol or runs out. One epsiode every moth or two. Usually it resolves on its own.  Currently feels well.  On telemetry, she converted to NSR overnight at 2 AM.   She does have some shoulder discomfort at times radiating up into her posterior neck when she is anxious and rushing. She takes Valium for this.    Present Illness . SOCIAL HISTORY: Negative for alcohol or tobacco abuse.   FAMILY HISTORY: Positive for hypertension and coronary artery disease.   Physical Exam:   GEN WD, WN, NAD    HEENT red conjunctivae    NECK supple  No masses    RESP normal resp effort  clear BS    CARD Regular rate and rhythm  No murmur    ABD denies tenderness  soft    LYMPH negative neck    EXTR negative edema    SKIN normal to palpation    NEURO cranial nerves intact, motor/sensory function intact    PSYCH alert, A+O to time, place, person, good insight   Review of Systems:    Subjective/Chief Complaint palpitations, SOB    General: No Complaints    Skin: No Complaints    ENT: No Complaints    Eyes: No Complaints    Neck: No Complaints    Respiratory: No Complaints    Cardiovascular: Palpitations  Dyspnea    Gastrointestinal: No Complaints    Genitourinary: No Complaints    Vascular: No Complaints    Musculoskeletal: No Complaints    Neurologic: No Complaints    Hematologic: No Complaints    Endocrine: No Complaints    Psychiatric: No Complaints    Review of Systems: All other systems were reviewed and found to be negative    Medications/Allergies Reviewed Medications/Allergies reviewed     depression:    chronic a fib:    defibrillator:    ovarian cyst:    cholecystectomy:        Admit Diagnosis:   ATRIAL FIBRILLATION SHORTNESS OF BREATH: 20-Oct-2011, Active, ATRIAL FIBRILLATION SHORTNESS OF BREATH   CHEST PAIN: Active, CHEST PAIN      Admit Reason:   Hypomagnesemia: (275.2) Active, ICD9, Disorders of magnesium metabolism   Chest pain: (786.50) Active, ICD9, Unspecified chest pain   Shortness of breath: (786.05) Active, ICD9, Shortness of breath   Atrial fibrillation: (427.31) Active, ICD9, Atrial fibrillation  Home Medications:  paroxetine 30 mg oral tablet: 2 tab(s) orally once a day, Active  simvastatin 40 mg oral tablet: 1 tab(s)  orally once a day (at bedtime), Active  diazepam 10 mg oral tablet: 1 tab(s) orally 3 times a day, As Needed, Active  Metoprolol Succinate ER 100 mg oral tablet, extended release: 1 tab(s) orally once a day, Active  Benadryl Dye Free Allergy 25 mg oral tablet: 1 tab(s) orally  prn for "allergies" per patient , Active  aspirin 325 mg oral tablet: 1 tab(s) orally once a day, Active  Tylenol Caplet Extra Strength 500 mg oral tablet: 2 tab(s) orally every 6 hours, As Needed- for Pain , Active    Cardiac:  28-[name]-13 01:49    CK, Total 56   CPK-MB, Serum 0.5  Routine Chem:  28-[name]-13  01:49    Glucose, Serum 112   BUN 13   Creatinine (comp) 1.50   Sodium, Serum 142   Potassium, Serum 3.4   Chloride, Serum 103   CO2, Serum 27   Calcium (Total), Serum 8.9  Hepatic:  28-[name]-13 01:49    Bilirubin, Total 0.3   Alkaline Phosphatase 69   SGPT (ALT) 27   SGOT (AST) 27   Total Protein, Serum 7.3   Albumin, Serum 3.9  Routine Chem:  28-[name]-13 01:49    Osmolality (calc) 284   eGFR (African American) 45   eGFR (Non-African American) 37   Anion Gap 12  Routine Hem:  28-[name]-13 01:49    WBC (CBC) 10.4   RBC (CBC) 4.26   Hemoglobin (CBC) 13.9   Hematocrit (CBC) 41.7   Platelet Count (CBC) 248   MCV 98   MCH 32.5   MCHC 33.2   RDW 14.2  Routine Chem:  28-[name]-13 01:49    Magnesium, Serum 2.5  Cardiac:  28-[name]-13 01:49    Troponin I < 0.02  Routine Hem:  28-[name]-13 01:49    Neutrophil % 49.8   Lymphocyte % 37.9   Monocyte % 6.9   Eosinophil % 4.7   Basophil % 0.7   Neutrophil # 5.2   Lymphocyte # 3.9   Monocyte # 0.7   Eosinophil # 0.5   Basophil # 0.1  Thyroid:  28-[name]-13 01:49    Thyroid Stimulating Hormone 3.12  Lab:  28-[name]-13 05:01    O2 Saturation (Pulse Ox) 95   FiO2 (Pulse Ox) ra   EKG:   Interpretation EKG shows atrial fibwith rate of 124 bpm, ST ABN in V3 to V6,   Radiology Results: XRay:    27-[name]-13 17:42, Chest Portable Single View   Chest Portable Single View    REASON FOR EXAM:    tachycardia  COMMENTS:       PROCEDURE: DXR - DXR PORTABLE CHEST SINGLE VIEW  - Oct 19 2011  5:42PM     RESULT: Comparison is made to the examination of March 27, 2011.    Sensing pacemaker device is present over the left chest. Mild left   ventricular prominence is present. The lungs appear clear without edema,   infiltrate, effusion or pneumothorax. The bony structures appear intact.    IMPRESSION:     1. No acute cardiopulmonary disease.   2. Mild cardiomegaly.  Thank you forthe opportunity to contribute to the care of your patient.            Verified By: Sundra Aland, M.D., MD    Codeine: Rash  Demerol HCl: Rash  Other- Explain in Comments Line: Hypotension  Advil: Hypotension  Vital Signs/Nurse's Notes: **Vital Signs.:   28-[name]-13 05:30   Vital Signs Type Routine   Temperature Temperature (  F) 97.9   Celsius 36.6   Temperature Source oral   Pulse Pulse 66   Pulse source per Dinamap   Respirations Respirations 18   Systolic BP Systolic BP 012   Diastolic BP (mmHg) Diastolic BP (mmHg) 66   Mean BP 82   BP Source Dinamap   Pulse Ox % Pulse Ox % 95   Pulse Ox Activity Level  At rest   Oxygen Delivery 2L     Impression [name] Is a 64 year old woman with history of  VF arrest status post ICD insertion, With recent generator change by Dr. Caryl Comes,  history of VT, history of atrial fibrillation r with at least 5-10 episodes lasting for several hours at a time, converting typically on their own, evaluated [facility] at [facility] in the past for atrial fib (has not taken medication as needed for atrial fibrillation episodes).  She presents with atrial fibrillation.  A/P: 1) Atrial fibrillation Suspect secondary to medication noncompliance Will change metoprolol succinate to tartrate Would d/c on metoprolol tartrate 50 mg po BID Hold diltiazem and anticoagulation. She has rare atrial fib and can probably be managed with aspirin alone at this time. --She can take an additional metroprolol tartrate PRN for breakthrough atrial fibrillation --Will give an extra metoprolol tartrate 25 mg this Am for a total of 50 mg. This evening, she can take metoprolol tartrate 50 mg (then BID) Follow up in our clinic in 1 to 2 weeks  2) VT, s/p ICD most recent ICD check shows no events, working well.  3) Depression/anxiety on valium, paxil  4) Hyperlipidemia On simvastatin   Electronic Signatures: Ida Rogue (MD)  (Signed 28-[name]-13 09:32)  Authored: General Aspect/Present Illness, History and Physical  Exam, Review of System, Past Medical History, Health Issues, Home Medications, Labs, EKG , Radiology, Allergies, Vital Signs/Nurse's Notes, Impression/Plan   Last Updated: 28-[name]-13 09:32 by Ida Rogue (MD)

## 2015-01-14 NOTE — Discharge Summary (Signed)
PATIENT NAME:  Brittney StaggersGRIFFIN, Brittney MR#:  [id number] DATE OF BIRTH:  [birth year]  DATE OF ADMISSION:  01/05/2012 DATE OF DISCHARGE:  01/06/2012  CONSULTANTS: Jolanta B. Jennet MaduroPucilowska, MD from psychiatry.   CHIEF COMPLAINT: Palpitations and heart racing.   DISCHARGE DIAGNOSES:  1. Atrial fibrillation with rapid ventricular response.  2. Hyperlipidemia. 3. Depression.   SECONDARY DIAGNOSES:  1. Chronic atrial fibrillation.  2. 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 3. Depression. 4. Anxiety. 5. Ovarian cyst.  6. Hyperlipidemia.  7. Hypertension. 8. Questionable bicuspid aortic valve.  9. 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   DISCHARGE MEDICATIONS:  1. Aspirin 325 mg daily.  2. Paxil 60 mg daily. 3. Metoprolol extended release 50 mg daily.  4. Zocor 40 mg daily.   DISPOSITION: Home.   ACTIVITY: As tolerated.   DIET: Low sodium, ADA diet.   FOLLOW-UP: Please follow up with Dr. Ladona Ridgelaylor your cardiologist within 1 to 2 weeks. An appointment at the [facility] has been made for you at 5:00 p.m. on 01/20/2012.   HISTORY OF PRESENT ILLNESS: For full details, please see the history and physical dictated on 01/05/2012 by Dr. Margie EgePhichith, but briefly this is a 64 year old female with chronic atrial fibrillation who presented with having palpitations and feeling of heart racing. She did not have a syncopal episode, but felt lightheaded and presyncopal. On arrival she was found to have atrial fibrillation with RVR. The patient states she goes in and out of atrial fibrillation. She apparently had her beta blocker decreased from 50 mg to 25 mg. She was admitted to hospitalist service for further evaluation and management.   SIGNIFICANT LABS AND IMAGING: Potassium 3.2 on arrival, BUN 17, creatinine 1.28. LDL 143, HDL 39. LFTs within normal limits. Troponins negative x3. TSH 1.38. Initial WBC 10.8, hemoglobin 13.6, hematocrit 40.1, INR 1. Urinalysis not  suggestive of infection. EKG initially showed atrial fibrillation with RVR rate of 132. ST and T wave abnormalities.   HOSPITAL COURSE: The patient was admitted to observation and telemetry. The patient has been given Cardizem IV x1 in the ER and converted to sinus. She is not on any anticoagulation. Her metoprolol was increased to 50 mg again. Her aspirin was resumed. She has no chest pain. She has no further palpitations. She was ruled out for acute coronary syndrome with three cyclic cardiac markers. Currently she has no chest pain. Her LDL was slightly elevated. She has been off of Zocor for financial issues. An [facility] appointment has been made for 01/20/2012 and she is to follow up there. A TCH was within normal limits as well. She will be discharged on aspirin and a higher dose of metoprolol. She was seen in by psychiatry for depression and started on Celexa as she had stated that she had stopped her Valium. She has no suicidal ideation. She did take a day of Celexa, however decided not to take it because of its side effect profile. At this point, she will be discharged with outpatient follow-up with her cardiologist for her atrial fibrillation. She also is to follow up with the [facility] as described above.   TOTAL TIME SPENT: 35 minutes.   CODE STATUS: FULL CODE.  ____________________________ Krystal EatonShayiq Evren Shankland, MD sa:rbg D:  01/06/2012 16:27:21 ET  T:  01/08/2012 08:44:58 ET  JOB#: [id number]  cc: Doylene CanningGregg W. Ladona Ridgelaylor, MD Krystal EatonSHAYIQ Akeyla Molden MD ELECTRONICALLY SIGNED 01/09/2012 12:44

## 2015-01-14 NOTE — H&P (Signed)
PATIENT NAME:  Brittney StaggersGRIFFIN, Brittney MR#:  [id number] DATE OF BIRTH:  1950-12-22  DATE OF ADMISSION:  10/19/2011  REFERRING PHYSICIAN: Dr. Enedina FinnerGoli    FAMILY PHYSICIAN: Dr. Lewayne BuntingGregg Taylor of [facility]    REASON FOR ADMISSION: Rapid atrial fibrillation associated with shortness of breath and chest and left upper extremity paresthesias.   HISTORY OF PRESENT ILLNESS: The patient is a 64 year old female with a history of chronic atrial fibrillation on aspirin therapy. She also has a history of cardiomyopathy status post defibrillator placement. Due to financial reasons, the patient ran out of her medications approximately three days ago. She presents to the Emergency Room with tachy palpitations associated with shortness of breath. She is also having discomfort in her chest and left upper extremity. Some paresthesias in that distribution. She is now admitted for further evaluation.   PAST MEDICAL HISTORY:  1. Chronic atrial fibrillation, on aspirin therapy.  2. Cardiomyopathy, status post defibrillator placement.  3. Depression.  4. Anxiety.  5. History of ovarian cyst.  6. Hyperlipidemia.  7. Benign hypertension.  8. Status post cholecystectomy.   MEDICATIONS:  1. Aspirin 325 mg p.o. daily.  2. Valium 10 mg p.o. t.i.d.  3. Paxil 60 mg p.o. daily.  4. Toprol-XL 50 mg p.o. daily.  5. Zocor 40 mg p.o. at bedtime.   ALLERGIES: Mycins, codeine, Demerol, and Advil.   SOCIAL HISTORY: Negative for alcohol or tobacco abuse.   FAMILY HISTORY: Positive for hypertension and coronary artery disease.   REVIEW OF SYSTEMS: CONSTITUTIONAL: No fever or change in weight. EYES: No blurred or double vision. No glaucoma. ENT: No tinnitus or hearing loss. No nasal discharge or bleeding. No difficulty swallowing. RESPIRATORY: No cough or wheezing. Denies hemoptysis. No painful respiration. CARDIOVASCULAR: Has had orthopnea. No actual syncope. GI: No nausea, vomiting, or diarrhea. No abdominal pain. No change in  bowel habits. GU: No dysuria or hematuria. No incontinence. ENDOCRINE: No polyuria or polydipsia. No heat or cold intolerance. HEMATOLOGIC: The patient denies anemia, easy bruising, or bleeding. LYMPHATIC: No swollen glands. MUSCULOSKELETAL: The patient denies pain in her neck, back, shoulders, knees, or hips. No gout. NEUROLOGIC: The patient has had some left upper extremity numbness and generalized weakness. Denies migraines. Denies stroke or seizures. PSYCH: The patient denies anxiety, insomnia, or depression.   PHYSICAL EXAMINATION:   GENERAL: The patient is in no acute distress.   VITAL SIGNS: Vital signs are currently remarkable for a blood pressure of 158/60 with a heart rate of 110 and a respiratory rate of 26. Sat is 96% on 2 liters.   HEENT: Normocephalic, atraumatic. Pupils equally round and reactive to light and accommodation. Extraocular movements are intact. Sclerae are nonicteric. Conjunctivae are clear. Oropharynx is clear.   NECK: Supple without JVD or bruits. No adenopathy or thyromegaly is noted.   LUNGS: Faint basilar crackles. No wheezes or rhonchi. No dullness. Respiratory effort is normal.   CARDIAC: Irregularly irregular rhythm with a rapid rate. No significant murmurs noted.   ABDOMEN: Soft, nontender with normoactive bowel sounds. No organomegaly or masses were appreciated. No hernias or bruits were noted.   EXTREMITIES: Without clubbing, cyanosis, or edema. Pulses were 2+ bilaterally.   SKIN: Warm and dry without rash or lesions.   NEUROLOGIC: Cranial nerves II through XII grossly intact. Deep tendon reflexes were symmetric. Motor and sensory exams nonfocal.   PSYCH: The patient was alert and oriented to person, place, and time. She was cooperative and used good judgment.   LABORATORY DATA: EKG revealed  atrial fibrillation with no acute ischemic changes with a rate of 124. Chest x-ray showed no acute infiltrate or edema. Troponin was less than 0.02 with a total CK  of 54 and an MB of less than 0.5. Glucose 87 with a BUN of 14 and a creatinine of 1.45 with a sodium of 137 and a potassium of 3.7. GFR was 39. Magnesium was 1.7. White count was 8.0 with a hemoglobin of 13.2.   ASSESSMENT:  1. Rapid atrial fibrillation.  2. Hypomagnesemia.  3. Atypical chest discomfort with dyspnea.  4. Stage III chronic kidney disease.  5. Accelerated hypertension, improved with treatment in the Emergency Room.  6. Anxiety/depression.   PLAN:  1. The patient will be admitted to telemetry with aspirin, Lovenox, and will resume her beta-blocker therapy. Will also add empiric nitrates and diltiazem. Because of her shortness of breath and accelerated hypertension, the patient will be given one dose of IV Lasix in the Emergency Room.  2. Will follow up a chest x-ray in the morning.  3. Will supplement oxygen and wean as tolerated.  4. Will follow serial cardiac enzymes and obtain an echocardiogram.  5. Will consult [facility] for further evaluation and treatment of her rapid atrial fibrillation and atypical chest discomfort.  6. Repeat routine labs in the morning.  7. Repeat magnesium after magnesium supplementation.  8. Further treatment and evaluation will depend upon the patient's progress.      TOTAL TIME SPENT ON THIS PATIENT: 50 minutes.   ____________________________ Duane Lope Judithann Sheen, MD jds:drc D: 10/19/2011 19:22:03 ET T: 10/20/2011 06:35:08 ET JOB#: [id number]  cc: Duane Lope. Judithann Sheen, MD, <Dictator> Doylene Canning. Ladona Ridgel, MD Marly Schuld Rodena Medin MD ELECTRONICALLY SIGNED 10/20/2011 15:40

## 2015-01-14 NOTE — Consult Note (Signed)
Brief Consult Note: Diagnosis: Major depressive disorder recurrent.   Patient was seen by consultant.   Consult note dictated.   Recommend further assessment or treatment.   Orders entered.   Discussed with Attending MD.   Comments: [name] has a h/o depression and anxiety. She discontinued Paxil and Valium [facility] about two months [facility] as she could no longer afford her copays and medications. She does not want to go back to Valium. She feels that Paxil has not been working lately. She would prefer psychotherapy to medications but is not opposed to try another antidepressant. She is about to receive widow's benefits but no health insurance.  PLAN: 1. I will not restart Valium.   2. I will start Celexa 20 mg daily for depression/anxiety.  3. [facility]. She could also use [facility] services. Information will be provided.   PLAN:.  Electronic Signatures: Kristine LineaPucilowska, Jolanta (MD)  (Signed 15-Apr-13 12:59)  Authored: Brief Consult Note   Last Updated: 15-Apr-13 12:59 by Kristine LineaPucilowska, Jolanta (MD)

## 2015-01-14 NOTE — Discharge Summary (Signed)
PATIENT NAME:  [name], [name] MR#:  [id number] DATE OF BIRTH:  10/15/1950  DATE OF ADMISSION:  10/19/2011 DATE OF DISCHARGE:  10/20/2011  CARDIOLOGIST: Julien Nordmannimothy Gollan, MD  DISCHARGE DIAGNOSES:  1. Rapid atrial fibrillation with well-controlled rate now likely secondary to medication noncompliance.  2. Hypomagnesemia, repleted.  3. Atypical chest discomfort with dyspnea, now resolved and likely due to rapid atrial fibrillation. 4. Stage III chronic kidney disease, stable and at baseline.  5. Accelerated hypertension, now resolved.  6. Anxiety/depression, on benzodiazepine, stable and at her baseline.   SECONDARY DIAGNOSES:  1. Chronic atrial fibrillation, on aspirin.  2. Cardiomyopathy status post defibrillator placement.  3. Depression.  4. Anxiety.  5. Hyperlipidemia.  6. Hypertension.   CONSULTANT: Julien Nordmannimothy Gollan, MD - Cardiology.  PROCEDURES/RADIOLOGY: Chest x-ray on 10/19/2011 showed no acute cardiopulmonary disease.   Chest x-ray on 10/20/2011 showed no acute cardiopulmonary disease, stable cardiomegaly.   2-D echocardiogram on 10/20/2011 showed normal LV systolic function. Ejection fraction is more than 55%. Normal LV systolic function. Normal RV systolic function. Normal RV systolic pressure. Mild aortic root dilatation. Mildly dilated ascending aorta.   HISTORY AND SHORT HOSPITAL COURSE: The patient is a 64 year old female with the above-mentioned medical problems who was admitted for rapid atrial fibrillation. She was not taking medication as she ran out of her Medicaid and also did not have medication for three days. She was found to have tachycardia and palpitations, was also short of breath and was started on Cardizem. She was ruled out with three negative sets of cardiac enzymes. Cardiology consult was obtained with Dr. Mariah MillingGollan who did not recommend anything more than resuming her medication and changing her metoprolol to tartrate instead of succinate, which was done. The  patient has been doing fairly well and is agreeable with the discharge plan. She did not have anymore chest pain. Her blood pressure is also back to normal range. She is being discharged home in stable condition.   PHYSICAL EXAMINATION:   VITAL SIGNS: On the date of discharge her temperature is 98.1, heart rate 70 per minute, respirations 18 per minute, blood pressure 111/62 mmHg, and she is saturating 92% on room air.   CARDIOVASCULAR: Irregularly irregular heart rate. No murmurs, rubs, or gallops.  LUNGS: Clear to auscultation bilaterally. No wheezing, rales, rhonchi, or crepitation.   ABDOMEN: Soft and benign.   NEUROLOGIC: Nonfocal examination. All other physical examination remained at baseline.   DISCHARGE MEDICATIONS:  1. Paroxetine 30 mg two tablets p.o. daily.  2. Simvastatin 40 mg p.o. at bedtime.  3. Diazepam 10 mg p.o. three times daily as needed.  4. Benadryl 25 mg p.o. daily as needed. 5. Aspirin 325 mg p.o. daily.  6. Tylenol 500 mg two tablets p.o. every six hours as needed.  7. Metoprolol tartrate 50 mg p.o. twice a day 25 mg p.o. daily as needed for breakthrough atrial fibrillation/palpitations.   DISCHARGE DIET: Low sodium, low cholesterol.   DISCHARGE ACTIVITY: As tolerated.  DISCHARGE INSTRUCTIONS AND FOLLOWUP: The patient was instructed to follow-up with her cardiologist/primary care physician, Dr. Mariah MillingGollan, in 1 to 2 weeks. She was instructed to find a new primary care physician in the area and she was given different options to choose from.   TOTAL TIME DISCHARGING PATIENT: 45 minutes. ____________________________ Ellamae SiaVipul S. Sherryll BurgerShah, MD vss:slb D: 10/20/2011 15:16:00 ET T: 10/21/2011 10:25:21 ET JOB#: [id number]  cc: Keshon Markovitz S. Sherryll BurgerShah, MD, <Dictator> Antonieta Ibaimothy J. Gollan, MD Ellamae SiaVIPUL S [facility] MD ELECTRONICALLY SIGNED 10/21/2011 11:22

## 2015-01-14 NOTE — Telephone Encounter (Signed)
Patient left her medications at a friends home and will not be able to access them until the friend returns. She asked for 2 day supply of xarelto and metoprolol. Rx faxed to 24 hour [facility].

## 2015-02-08 ENCOUNTER — Telehealth: Payer: Self-pay | Admitting: Cardiology

## 2015-02-08 NOTE — Telephone Encounter (Signed)
Pt has been in a fib all after noon, she has taken a total of 3 of her lopressor - still in a fib, BP 119/102 on home cuff  she will take 25 mg more and if no improvement she willcome to ER she is taking her xarelto approp. .Marland Kitchen

## 2015-02-26 ENCOUNTER — Encounter: Payer: Self-pay | Admitting: *Deleted

## 2015-03-23 ENCOUNTER — Encounter (HOSPITAL_COMMUNITY): Payer: Self-pay

## 2015-03-23 ENCOUNTER — Emergency Department (HOSPITAL_COMMUNITY)
Admission: EM | Admit: 2015-03-23 | Discharge: 2015-03-23 | Disposition: A | Discharge status: Home / Self Care | Payer: Commercial Managed Care - HMO | Attending: Emergency Medicine | Admitting: Emergency Medicine

## 2015-03-23 DIAGNOSIS — Z8673 Personal history of transient ischemic attack (TIA), and cerebral infarction without residual deficits: Secondary | ICD-10-CM | POA: Diagnosis not present

## 2015-03-23 DIAGNOSIS — Z8674 Personal history of sudden cardiac arrest: Secondary | ICD-10-CM | POA: Insufficient documentation

## 2015-03-23 DIAGNOSIS — I10 Essential (primary) hypertension: Secondary | ICD-10-CM | POA: Insufficient documentation

## 2015-03-23 DIAGNOSIS — J449 Chronic obstructive pulmonary disease, unspecified: Secondary | ICD-10-CM | POA: Diagnosis not present

## 2015-03-23 DIAGNOSIS — Z95810 Presence of automatic (implantable) cardiac defibrillator: Secondary | ICD-10-CM | POA: Insufficient documentation

## 2015-03-23 DIAGNOSIS — R55 Syncope and collapse: Secondary | ICD-10-CM | POA: Insufficient documentation

## 2015-03-23 DIAGNOSIS — N289 Disorder of kidney and ureter, unspecified: Secondary | ICD-10-CM | POA: Insufficient documentation

## 2015-03-23 DIAGNOSIS — F419 Anxiety disorder, unspecified: Secondary | ICD-10-CM | POA: Diagnosis not present

## 2015-03-23 DIAGNOSIS — Z79899 Other long term (current) drug therapy: Secondary | ICD-10-CM | POA: Diagnosis not present

## 2015-03-23 DIAGNOSIS — I2510 Atherosclerotic heart disease of native coronary artery without angina pectoris: Secondary | ICD-10-CM | POA: Diagnosis not present

## 2015-03-23 DIAGNOSIS — G40909 Epilepsy, unspecified, not intractable, without status epilepticus: Secondary | ICD-10-CM | POA: Insufficient documentation

## 2015-03-23 DIAGNOSIS — E785 Hyperlipidemia, unspecified: Secondary | ICD-10-CM | POA: Insufficient documentation

## 2015-03-23 DIAGNOSIS — Z87891 Personal history of nicotine dependence: Secondary | ICD-10-CM | POA: Diagnosis not present

## 2015-03-23 DIAGNOSIS — Z8739 Personal history of other diseases of the musculoskeletal system and connective tissue: Secondary | ICD-10-CM | POA: Diagnosis not present

## 2015-03-23 LAB — BASIC METABOLIC PANEL
ANION GAP: 10 (ref 5–15)
BUN: 23 mg/dL — ABNORMAL HIGH (ref 6–20)
CALCIUM: 9.4 mg/dL (ref 8.9–10.3)
CO2: 23 mmol/L (ref 22–32)
CREATININE: 1.28 mg/dL — AB (ref 0.44–1.00)
Chloride: 107 mmol/L (ref 101–111)
GFR calc Af Amer: 50 mL/min — ABNORMAL LOW (ref >60)
GFR, EST NON AFRICAN AMERICAN: 43 mL/min — AB (ref >60)
GLUCOSE: 100 mg/dL — AB (ref 65–99)
POTASSIUM: 3.4 mmol/L — AB (ref 3.5–5.1)
Sodium: 140 mmol/L (ref 135–145)

## 2015-03-23 LAB — CBC WITH DIFFERENTIAL/PLATELET
Basophils Absolute: 0.1 10*3/uL (ref 0.0–0.1)
Basophils Relative: 1 % (ref 0–1)
EOS ABS: 0.4 10*3/uL (ref 0.0–0.7)
Eosinophils Relative: 4 % (ref 0–5)
HCT: 41.3 % (ref 36.0–46.0)
HEMOGLOBIN: 14.2 g/dL (ref 12.0–15.0)
Lymphocytes Relative: 24 % (ref 12–46)
Lymphs Abs: 2.1 10*3/uL (ref 0.7–4.0)
MCH: 31.8 pg (ref 26.0–34.0)
MCHC: 34.4 g/dL (ref 30.0–36.0)
MCV: 92.6 fL (ref 78.0–100.0)
MONO ABS: 0.7 10*3/uL (ref 0.1–1.0)
Monocytes Relative: 8 % (ref 3–12)
NEUTROS ABS: 5.7 10*3/uL (ref 1.7–7.7)
Neutrophils Relative %: 63 % (ref 43–77)
PLATELETS: 200 10*3/uL (ref 150–400)
RBC: 4.46 MIL/uL (ref 3.87–5.11)
RDW: 12.9 % (ref 11.5–15.5)
WBC: 9 10*3/uL (ref 4.0–10.5)

## 2015-03-23 LAB — CBG MONITORING, ED: Glucose-Capillary: 102 mg/dL — ABNORMAL HIGH (ref 65–99)

## 2015-03-23 LAB — TROPONIN I: Troponin I: <0.03 ng/mL (ref <0.031)

## 2015-03-23 NOTE — ED Notes (Signed)
PER EMS: pt brought in from convenient store. She felt like her blood sugar was low so she went to store to get something to eat. She says she got lightheaded and pale, she laid on the floor in the store so she wouldn't fall. Denies LOC but reports loss of bowel control during episode while on the floor at the store. BP on scene 86 systolic, A&OX4. EMS adm 250 cc normal saline and BP increased to 118/63, HR-72, 96% RA.

## 2015-03-23 NOTE — ED Provider Notes (Addendum)
CSN: [id number]     Arrival date & time 03/23/15  0236 History   This chart was scribed for Dione Booze, MD by Arlan Organ, ED Scribe. This patient was seen in room A09C/A09C and the patient's care was started 2:40 AM.   Chief Complaint  Patient presents with  . Near Syncope   The history is provided by the patient. No language interpreter was used.    HPI Comments: [name] brought in by EMS from the convenient store is a 64 y.o. female who presents to the Emergency Department here for a near syncopal episode just prior to arrival. Pt states she was at the grocery store when she felt like her blood sugar was low. Pt experienced mild lightheadedness and pale discoloration to the skin. She then decided to lay on the floor to avoid falling. No head trauma or LOC. However, she admits to loss of bowel control during tonights episode. No recent fever, chills, nausea, vomiting, chest pain, or shortness of breath. Ms. Papandrea states these episodes are not new to her as she often experienced near syncope. She is under the care of Dr. Lewayne Bunting of [facility]. Last follow up October 2015. PMHx includes CAD, stroke, COPD, A-Fib, and HTN.  Past Medical History  Diagnosis Date  . Coronary artery disease     non obstructive  . Stroke   . Seizures   . Anxiety   . Depression   . Migraine   . Fibromuscular dysplasia   . Tobacco abuse   . COPD (chronic obstructive pulmonary disease)   . Arthritis of knee     Bilateral  . Tobacco abuse   . Migraine headache   . Anxiety disorder   . Depression   . Seizure disorder   . Cerebrovascular disease   . CAD (coronary artery disease)   . Automatic implantable cardiac defibrillator in situ     [employer] ICD  . HTN (hypertension)   . HLD (hyperlipidemia)   . Ventricular fibrillation   . Long QT syndrome   . Cardiac arrest   . Atrial fibrillation     a) 2D echo 09/11/12: LVEF 55-60%, mild LVH, mld LA dilatation  . Allergy    Past  Surgical History  Procedure Laterality Date  . Pacemaker insertion  05/01/2003    ICD, implantation of a Guidant single chamber defibrillator, Doylene Canning. Ladona Ridgel MD  . Cholecystectomy    . Tubal ligation    . Breast lumpectomy    . Appendectomy    . Cosmetic surgery     Family History  Problem Relation Age of Onset  . Dementia Mother   . Stroke Mother     Multiple  . Aneurysm Sister     Brain  . Stroke Sister   . Uterine cancer Other     Grandmother  . Colon cancer Sister   . Cancer Sister    History  Substance Use Topics  . Smoking status: Former Smoker -- 0.25 packs/day for 40 years    Types: Cigarettes  . Smokeless tobacco: Never Used     Comment: almost a pack a week  . Alcohol Use: No   OB History    No data available     Review of Systems  Constitutional: Negative for fever and chills.  Respiratory: Negative for cough and shortness of breath.   Gastrointestinal: Negative for nausea, vomiting and abdominal pain.  Musculoskeletal: Negative for arthralgias.  Skin: Positive for color change.  Neurological: Positive  for light-headedness. Negative for headaches.  Psychiatric/Behavioral: Negative for confusion.  All other systems reviewed and are negative.     Allergies  Azithromycin; Ibuprofen; Codeine; and Demerol  Home Medications   Prior to Admission medications   Medication Sig Start Date End Date Taking? Authorizing Provider  acetaminophen (TYLENOL) 500 MG tablet Take 500 mg by mouth every 6 (six) hours as needed for headache.    Historical Provider, MD  albuterol (PROVENTIL HFA;VENTOLIN HFA) 108 (90 BASE) MCG/ACT inhaler Inhale 2 puffs into the lungs every 6 (six) hours as needed for wheezing.    Historical Provider, MD  diazepam (VALIUM) 5 MG tablet Take 1 tablet (5 mg total) by mouth every 12 (twelve) hours as needed for anxiety. Patient taking differently: Take 5 mg by mouth 3 (three) times daily.  06/27/14   Iran Ouch, MD  diphenhydrAMINE  (BENADRYL) 25 MG tablet Take 50 mg by mouth every 6 (six) hours as needed for allergies or sleep.     Historical Provider, MD  diphenhydramine-acetaminophen (TYLENOL PM) 25-500 MG TABS Take 1 tablet by mouth at bedtime as needed (sleep).    Historical Provider, MD  loperamide (IMODIUM A-D) 2 MG tablet Take 2 mg by mouth 4 (four) times daily as needed for diarrhea or loose stools.    Historical Provider, MD  metoprolol tartrate (LOPRESSOR) 25 MG tablet Take 25 mg by mouth 2 (two) times daily. Take one tablet by mouth twice a day, you can take one or two extra tablets daily as needed for break through Afib 06/27/14   Iran Ouch, MD  metoprolol tartrate (LOPRESSOR) 25 MG tablet Take 1 tablet (25 mg total) by mouth 2 (two) times daily. 01/14/15   Glori Luis, MD  mirtazapine (REMERON) 15 MG tablet Take 15 mg by mouth 3 times/day as needed-between meals & bedtime (sleep).  10/20/14   Historical Provider, MD  potassium chloride SA (K-DUR,KLOR-CON) 20 MEQ tablet Take 1 tablet (20 mEq total) by mouth daily. Patient not taking: Reported on 12/16/2014 05/31/14   Pricilla Loveless, MD  Rivaroxaban (XARELTO) 15 MG TABS tablet Take 1 tablet (15 mg total) by mouth at bedtime. 01/14/15   Glori Luis, MD  sertraline (ZOLOFT) 100 MG tablet Take 200 mg by mouth at bedtime.     Historical Provider, MD  simvastatin (ZOCOR) 20 MG tablet Take 1 tablet (20 mg total) by mouth at bedtime. 06/27/14   Iran Ouch, MD  triamcinolone (NASACORT ALLERGY 24HR) 55 MCG/ACT AERO nasal inhaler Place 2 sprays into the nose daily as needed.    Historical Provider, MD   Triage Vitals: BP 116/75 mmHg  Pulse 67  Temp(Src) 97.5 F (36.4 C) (Oral)  Resp 18  SpO2 100%   Physical Exam  Constitutional: She is oriented to person, place, and time. She appears well-developed and well-nourished. No distress.  HENT:  Head: Normocephalic and atraumatic.  Eyes: EOM are normal. Pupils are equal, round, and reactive to light.  Neck:  Normal range of motion. Neck supple. No JVD present.  Cardiovascular: Normal rate, regular rhythm and normal heart sounds.   No murmur heard. Pulmonary/Chest: Effort normal and breath sounds normal. She has no wheezes. She has no rales. She exhibits no tenderness.  Abdominal: Soft. Bowel sounds are normal. She exhibits no distension and no mass. There is no tenderness.  Musculoskeletal: Normal range of motion. She exhibits no edema.  Neurological: She is alert and oriented to person, place, and time. No cranial nerve deficit. She exhibits  normal muscle tone. Coordination normal.  Skin: Skin is warm and dry. No rash noted.  Psychiatric: She has a normal mood and affect. Her behavior is normal. Judgment and thought content normal.  Nursing note and vitals reviewed.   ED Course  Procedures (including critical care time)  DIAGNOSTIC STUDIES: Oxygen Saturation is 100% on RA, Normal by my interpretation.    COORDINATION OF CARE: 2:40 AM- Will order CBC, Troponin I, BMP, and EKG. Discussed treatment plan with pt at bedside and pt agreed to plan.     Labs Review Results for orders placed or performed during the hospital encounter of 03/23/15  Basic metabolic panel  Result Value Ref Range   Sodium 140 135 - 145 mmol/L   Potassium 3.4 (L) 3.5 - 5.1 mmol/L   Chloride 107 101 - 111 mmol/L   CO2 23 22 - 32 mmol/L   Glucose, Bld 100 (H) 65 - 99 mg/dL   BUN 23 (H) 6 - 20 mg/dL   Creatinine, Ser 1.61 (H) 0.44 - 1.00 mg/dL   Calcium 9.4 8.9 - 09.6 mg/dL   GFR calc non Af Amer 43 (L) >60 mL/min   GFR calc Af Amer 50 (L) >60 mL/min   Anion gap 10 5 - 15  Troponin I  Result Value Ref Range   Troponin I <0.03 <0.031 ng/mL  CBC with Differential  Result Value Ref Range   WBC 9.0 4.0 - 10.5 K/uL   RBC 4.46 3.87 - 5.11 MIL/uL   Hemoglobin 14.2 12.0 - 15.0 g/dL   HCT 04.5 40.9 - 81.1 %   MCV 92.6 78.0 - 100.0 fL   MCH 31.8 26.0 - 34.0 pg   MCHC 34.4 30.0 - 36.0 g/dL   RDW 91.4 78.2 - 95.6 %    Platelets 200 150 - 400 K/uL   Neutrophils Relative % 63 43 - 77 %   Neutro Abs 5.7 1.7 - 7.7 K/uL   Lymphocytes Relative 24 12 - 46 %   Lymphs Abs 2.1 0.7 - 4.0 K/uL   Monocytes Relative 8 3 - 12 %   Monocytes Absolute 0.7 0.1 - 1.0 K/uL   Eosinophils Relative 4 0 - 5 %   Eosinophils Absolute 0.4 0.0 - 0.7 K/uL   Basophils Relative 1 0 - 1 %   Basophils Absolute 0.1 0.0 - 0.1 K/uL  CBG, ED  Result Value Ref Range   Glucose-Capillary 102 (H) 65 - 99 mg/dL    EKG Interpretation   Date/Time:  Friday March 23 2015 02:43:03 EDT Ventricular Rate:  66 PR Interval:  247 QRS Duration: 91 QT Interval:  466 QTC Calculation: 488 R Axis:   20 Text Interpretation:  Sinus rhythm Prolonged PR interval Consider left  atrial enlargement Borderline repolarization abnormality Borderline  prolonged QT interval When compared with ECG of 07/20/2014, Normal sinus  rhythm with 1st degree A-V block has replaced Atrial fibrillation  Confirmed by Preston Fleeting  MD, Nuha Degner ([id number]) on 03/23/2015 2:49:01 AM      MDM   Final diagnoses:  Near syncope  Renal insufficiency    Near-syncope. Patient has history of same and this is not any different from previous episodes. Laboratory workup shows renal insufficiency which is unchanged from baseline. ECG shows that she is no longer in atrial fibrillation. No indication for further workup and patient is discharged.  I personally performed the services described in this documentation, which was scribed in my presence. The recorded information has been reviewed and is accurate.  Dione Boozeavid Luxe Cuadros, MD 03/23/15 [id number]  Dione Boozeavid Matteson Blue, MD 03/23/15 [id number]

## 2015-03-23 NOTE — ED Notes (Signed)
Pt reports she feels better but only complaint is "I'm hungry."

## 2015-03-23 NOTE — Discharge Instructions (Signed)
Near-Syncope Near-syncope (commonly known as near fainting) is sudden weakness, dizziness, or feeling like you might pass out. During an episode of near-syncope, you may also develop pale skin, have tunnel vision, or feel sick to your stomach (nauseous). Near-syncope may occur when getting up after sitting or while standing for a long time. It is caused by a sudden decrease in blood flow to the brain. This decrease can result from various causes or triggers, most of which are not serious. However, because near-syncope can sometimes be a sign of something serious, a medical evaluation is required. The specific cause is often not determined. HOME CARE INSTRUCTIONS  Monitor your condition for any changes. The following actions may help to alleviate any discomfort you are experiencing:  Have someone stay with you until you feel stable.  Lie down right away and prop your feet up if you start feeling like you might faint. Breathe deeply and steadily. Wait until all the symptoms have passed. Most of these episodes last only a few minutes. You may feel tired for several hours.   Drink enough fluids to keep your urine clear or pale yellow.   If you are taking blood pressure or heart medicine, get up slowly when seated or lying down. Take several minutes to sit and then stand. This can reduce dizziness.  Follow up with your health care provider as directed. SEEK IMMEDIATE MEDICAL CARE IF:   You have a severe headache.   You have unusual pain in the chest, abdomen, or back.   You are bleeding from the mouth or rectum, or you have black or tarry stool.   You have an irregular or very fast heartbeat.   You have repeated fainting or have seizure-like jerking during an episode.   You faint when sitting or lying down.   You have confusion.   You have difficulty walking.   You have severe weakness.   You have vision problems.  MAKE SURE YOU:   Understand these instructions.  Will  watch your condition.  Will get help right away if you are not doing well or get worse. Document Released: 09/08/2005 Document Revised: 09/13/2013 Document Reviewed: 02/11/2013 Exit[employer] Patient Information 2015 [employer]. This information is not intended to replace advice given to you by your health care provider. Make sure you discuss any questions you have with your health care provider.  

## 2015-03-23 NOTE — ED Notes (Signed)
cbg 102 

## 2015-04-12 NOTE — Progress Notes (Signed)
Letter to pt sent 02/26/15 was returned with [facility] - undeliverable to previous address [employer]) - address has since been updated and am sending out letter again.

## 2015-04-27 ENCOUNTER — Other Ambulatory Visit: Payer: Self-pay | Admitting: Emergency Medicine

## 2015-05-01 ENCOUNTER — Other Ambulatory Visit: Payer: Self-pay | Admitting: *Deleted

## 2015-05-01 MED ORDER — RIVAROXABAN 15 MG PO TABS: 15.0000 mg | ORAL_TABLET | Freq: Every day | ORAL | Status: DC

## 2015-05-08 ENCOUNTER — Ambulatory Visit (INDEPENDENT_AMBULATORY_CARE_PROVIDER_SITE_OTHER): Payer: Commercial Managed Care - HMO | Admitting: Cardiovascular Disease

## 2015-05-08 ENCOUNTER — Encounter: Payer: Self-pay | Admitting: Cardiovascular Disease

## 2015-05-08 VITALS — BP 126/90 | HR 98 | Ht 64.0 in | Wt 169.0 lb

## 2015-05-08 DIAGNOSIS — Z720 Tobacco use: Secondary | ICD-10-CM

## 2015-05-08 DIAGNOSIS — I480 Paroxysmal atrial fibrillation: Secondary | ICD-10-CM

## 2015-05-08 DIAGNOSIS — Z95810 Presence of automatic (implantable) cardiac defibrillator: Secondary | ICD-10-CM

## 2015-05-08 DIAGNOSIS — F17200 Nicotine dependence, unspecified, uncomplicated: Secondary | ICD-10-CM

## 2015-05-08 MED ORDER — METOPROLOL TARTRATE 25 MG PO TABS: 25.0000 mg | ORAL_TABLET | Freq: Two times a day (BID) | ORAL | Status: DC

## 2015-05-08 MED ORDER — SIMVASTATIN 20 MG PO TABS: 20.0000 mg | ORAL_TABLET | Freq: Every day | ORAL | Status: DC

## 2015-05-08 MED ORDER — RIVAROXABAN 15 MG PO TABS: 15.0000 mg | ORAL_TABLET | Freq: Every day | ORAL | Status: DC

## 2015-05-08 NOTE — Assessment & Plan Note (Signed)
She is interested in smoking cessation. I spent 5 minutes discussing different options for smoking cessation and referred her to the smoking cessation class at [facility]. She is also going to call [phone] now and she is interested in getting nicotine patches. Given her history of anxiety, Chantix might not be the best option.

## 2015-05-08 NOTE — Progress Notes (Signed)
HPI  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 She also has history of paroxysmal atrial fibrillation. She has been treated with metoprolol. She is known to have anxiety. She had recurrent admissions in 2013 for atrial fibrillation with rapid ventricular response. No recent hospitalization although she had multiple recent emergency room visit for palpitations .  she is on anticoagulation with Xarelto.  He continues to smoke and is interested in quitting. Atrial fibrillation has been reasonably controlled. It usually responds Her dose of metoprolol.   Allergies  Allergen Reactions  . Azithromycin Other (See Comments)    Hypotension   . Ibuprofen Other (See Comments)    Hypotension. Takes ASA without problem hypotension  . Codeine Rash  . Demerol [Meperidine] Rash     Current Outpatient Prescriptions on File Prior to Visit  Medication Sig Dispense Refill  . acetaminophen (TYLENOL) 500 MG tablet Take 500 mg by mouth every 6 (six) hours as needed for headache.    . albuterol (PROVENTIL HFA;VENTOLIN HFA) 108 (90 BASE) MCG/ACT inhaler Inhale 2 puffs into the lungs every 6 (six) hours as needed for wheezing.    . diazepam (VALIUM) 5 MG tablet Take 1 tablet (5 mg total) by mouth every 12 (twelve) hours as needed for anxiety. (Patient taking differently: Take 5 mg by mouth 3 (three) times daily. ) 14 tablet 0  . diphenhydrAMINE (BENADRYL) 25 MG tablet Take 50 mg by mouth every 6 (six) hours as needed for allergies or sleep.     . mirtazapine (REMERON) 15 MG tablet Take 15 mg by mouth 3 times/day as needed-between meals & bedtime (sleep).     . potassium chloride SA (K-DUR,KLOR-CON) 20 MEQ tablet Take 1 tablet (20 mEq total) by mouth daily. 3 tablet 0  . sertraline (ZOLOFT) 100 MG tablet Take 200 mg by mouth at bedtime.      No current facility-administered medications on file prior to visit.     Past Medical History    Diagnosis Date  . Coronary artery disease     non obstructive  . Stroke   . Seizures   . Anxiety   . Depression   . Migraine   . Fibromuscular dysplasia   . Tobacco abuse   . COPD (chronic obstructive pulmonary disease)   . Arthritis of knee     Bilateral  . Tobacco abuse   . Migraine headache   . Anxiety disorder   . Depression   . Seizure disorder   . Cerebrovascular disease   . CAD (coronary artery disease)   . Automatic implantable cardiac defibrillator in situ     [employer] ICD  . HTN (hypertension)   . HLD (hyperlipidemia)   . Ventricular fibrillation   . Long QT syndrome   . Cardiac arrest   . Atrial fibrillation     a) 2D echo 09/11/12: LVEF 55-60%, mild LVH, mld LA dilatation  . Allergy      Past Surgical History  Procedure Laterality Date  . Pacemaker insertion  05/01/2003    ICD, implantation of a Guidant single chamber defibrillator, Doylene Canning. Ladona Ridgel MD  . Cholecystectomy    . Tubal ligation    . Breast lumpectomy    . Appendectomy    . Cosmetic surgery       Family History  Problem Relation Age of Onset  . Dementia Mother   . Stroke Mother  Multiple  . Aneurysm Sister     Brain  . Stroke Sister   . Uterine cancer Other     Grandmother  . Colon cancer Sister   . Cancer Sister      Social History   Social History  . Marital Status: Divorced    Spouse Name: N/A  . Number of Children: N/A  . Years of Education: N/A   Occupational History  . Disabled    Social History Main Topics  . Smoking status: Former Smoker -- 0.25 packs/day for 40 years    Types: Cigarettes  . Smokeless tobacco: Never Used     Comment: almost a pack a week  . Alcohol Use: No  . Drug Use: No  . Sexual Activity: No   Other Topics Concern  . Not on file   Social History Narrative   Lives in [place]   On disability for her osteoarthritis   Recently estranged from her husband      PHYSICAL EXAM   BP 126/90 mmHg  Pulse 98  Ht 5\' 4"   (1.626 m)  Wt 169 lb (76.658 kg)  BMI 28.99 kg/m2 Constitutional: She is oriented to person, place, and time. She appears well-developed and well-nourished. No distress.  HENT: No nasal discharge.  Head: Normocephalic and atraumatic.  Eyes: Pupils are equal and round. Right eye exhibits no discharge. Left eye exhibits no discharge.  Neck: Normal range of motion. Neck supple. No JVD present. No thyromegaly present.  Cardiovascular: Normal rate, regular rhythm, normal heart sounds. Exam reveals no gallop and no friction rub. No murmur heard.  Pulmonary/Chest: Effort normal and breath sounds normal. No stridor. No respiratory distress. She has no wheezes. She has no rales. She exhibits no tenderness.  Abdominal: Soft. Bowel sounds are normal. She exhibits no distension. There is no tenderness. There is no rebound and no guarding.  Musculoskeletal: Normal range of motion. She exhibits no edema and no tenderness.  Neurological: She is alert and oriented to person, place, and time. Coordination normal.  Skin: Skin is warm and dry. No rash noted. She is not diaphoretic. No erythema. No pallor.  Psychiatric: She has a normal mood and affect. Her behavior is normal. Judgment and thought content normal.      ASSESSMENT AND PLAN

## 2015-05-08 NOTE — Patient Instructions (Addendum)
Medication Instructions:   Your physician recommends that you continue on your current medications as directed. Please refer to the Current Medication list given to you today.   Labwork:  NONE ORDER TODAY    Testing/Procedures:  NONE ORDER TODAY   Follow-Up:  Your physician wants you to follow-up in:  IN   6  MONTHS WITH DR  Katheren Puller will receive a reminder letter in the mail two months in advance. If you don't receive a letter, please call our office to schedule the follow-up appointment.      Any Other Special Instructions Will Be Listed Below (If Applicable).  Contact number [phone]   Smoking Cessation Quitting smoking is important to your health and has many advantages. However, it is not always easy to quit since nicotine is a very addictive drug. Oftentimes, people try 3 times or more before being able to quit. This document explains the best ways for you to prepare to quit smoking. Quitting takes hard work and a lot of effort, but you can do it. ADVANTAGES OF QUITTING SMOKING  You will live longer, feel better, and live better.  Your body will feel the impact of quitting smoking almost immediately.  Within 20 minutes, blood pressure decreases. Your pulse returns to its normal level.  After 8 hours, carbon monoxide levels in the blood return to normal. Your oxygen level increases.  After 24 hours, the chance of having a heart attack starts to decrease. Your breath, hair, and body stop smelling like smoke.  After 48 hours, damaged nerve endings begin to recover. Your sense of taste and smell improve.  After 72 hours, the body is virtually free of nicotine. Your bronchial tubes relax and breathing becomes easier.  After 2 to 12 weeks, lungs can hold more air. Exercise becomes easier and circulation improves.  The risk of having a heart attack, stroke, cancer, or lung disease is greatly reduced.  After 1 year, the risk of coronary heart disease is cut in  half.  After 5 years, the risk of stroke falls to the same as a nonsmoker.  After 10 years, the risk of lung cancer is cut in half and the risk of other cancers decreases significantly.  After 15 years, the risk of coronary heart disease drops, usually to the level of a nonsmoker.  If you are pregnant, quitting smoking will improve your chances of having a healthy baby.  The people you live with, especially any children, will be healthier.  You will have extra money to spend on things other than cigarettes. QUESTIONS TO THINK ABOUT BEFORE ATTEMPTING TO QUIT You may want to talk about your answers with your health care provider.  Why do you want to quit?  If you tried to quit in the past, what helped and what did not?  What will be the most difficult situations for you after you quit? How will you plan to handle them?  Who can help you through the tough times? Your family? Friends? A health care provider?  What pleasures do you get from smoking? What ways can you still get pleasure if you quit? Here are some questions to ask your health care provider:  How can you help me to be successful at quitting?  What medicine do you think would be best for me and how should I take it?  What should I do if I need more help?  What is smoking withdrawal like? How can I get information on withdrawal? GET READY  Set a quit date.  Change your environment by getting rid of all cigarettes, ashtrays, matches, and lighters in your home, car, or work. Do not let people smoke in your home.  Review your past attempts to quit. Think about what worked and what did not. GET SUPPORT AND ENCOURAGEMENT You have a better chance of being successful if you have help. You can get support in many ways.  Tell your family, friends, and coworkers that you are going to quit and need their support. Ask them not to smoke around you.  Get individual, group, or telephone counseling and support. Programs are  available at [employer] and health centers. Call your local health department for information about programs in your area.  Spiritual beliefs and practices may help some smokers quit.  Download a "quit meter" on your computer to keep track of quit statistics, such as how long you have gone without smoking, cigarettes not smoked, and money saved.  Get a self-help book about quitting smoking and staying off tobacco. LEARN NEW SKILLS AND BEHAVIORS  Distract yourself from urges to smoke. Talk to someone, go for a walk, or occupy your time with a task.  Change your normal routine. Take a different route to work. Drink tea instead of coffee. Eat breakfast in a different place.  Reduce your stress. Take a hot bath, exercise, or read a book.  Plan something enjoyable to do every day. Reward yourself for not smoking.  Explore interactive web-based programs that specialize in helping you quit. GET MEDICINE AND USE IT CORRECTLY Medicines can help you stop smoking and decrease the urge to smoke. Combining medicine with the above behavioral methods and support can greatly increase your chances of successfully quitting smoking.  Nicotine replacement therapy helps deliver nicotine to your body without the negative effects and risks of smoking. Nicotine replacement therapy includes nicotine gum, lozenges, inhalers, nasal sprays, and skin patches. Some may be available over-the-counter and others require a prescription.  Antidepressant medicine helps people abstain from smoking, but how this works is unknown. This medicine is available by prescription.  Nicotinic receptor partial agonist medicine simulates the effect of nicotine in your brain. This medicine is available by prescription. Ask your health care provider for advice about which medicines to use and how to use them based on your health history. Your health care provider will tell you what side effects to look out for if you choose to be on a  medicine or therapy. Carefully read the information on the package. Do not use any other product containing nicotine while using a nicotine replacement product.  RELAPSE OR DIFFICULT SITUATIONS Most relapses occur within the first 3 months after quitting. Do not be discouraged if you start smoking again. Remember, most people try several times before finally quitting. You may have symptoms of withdrawal because your body is used to nicotine. You may crave cigarettes, be irritable, feel very hungry, cough often, get headaches, or have difficulty concentrating. The withdrawal symptoms are only temporary. They are strongest when you first quit, but they will go away within 10-14 days. To reduce the chances of relapse, try to:  Avoid drinking alcohol. Drinking lowers your chances of successfully quitting.  Reduce the amount of caffeine you consume. Once you quit smoking, the amount of caffeine in your body increases and can give you symptoms, such as a rapid heartbeat, sweating, and anxiety.  Avoid smokers because they can make you want to smoke.  Do not let weight gain distract  you. Many smokers will gain weight when they quit, usually less than 10 pounds. Eat a healthy diet and stay active. You can always lose the weight gained after you quit.  Find ways to improve your mood other than smoking. FOR MORE INFORMATION  www.smokefree.gov  Document Released: 09/02/2001 Document Revised: 01/23/2014 Document Reviewed: 12/18/2011 [facility] Patient Information 2015 [place], [place]. This information is not intended to replace advice given to you by your health care provider. Make sure you discuss any questions you have with your health care provider.

## 2015-05-08 NOTE — Assessment & Plan Note (Signed)
Symptoms are reasonably controlled with metoprolol. She takes 25 mg twice daily around-the-clock as well as additional 25 mg as needed. She is on long-term anticoagulation with no reported side effects.

## 2015-05-08 NOTE — Assessment & Plan Note (Signed)
She needs to follow-up in the device clinic.

## 2015-05-31 ENCOUNTER — Telehealth: Payer: Self-pay

## 2015-05-31 ENCOUNTER — Other Ambulatory Visit: Payer: Self-pay | Admitting: Cardiovascular Disease

## 2015-05-31 NOTE — Telephone Encounter (Signed)
Please review for refill. Thanks!  

## 2015-05-31 NOTE — Telephone Encounter (Signed)
Tried calling patient back about voicemail she left. she did not explain what she wanted and her phone number just rings with no voicemail.

## 2015-06-05 ENCOUNTER — Other Ambulatory Visit: Payer: Self-pay

## 2015-06-05 MED ORDER — METOPROLOL TARTRATE 25 MG PO TABS: 25.0000 mg | ORAL_TABLET | Freq: Two times a day (BID) | ORAL | Status: DC

## 2015-06-05 NOTE — Telephone Encounter (Signed)
Iran Ouch, MD at 05/08/2015 9:34 AM  Paroxysmal atrial fibrillation - Iran Ouch, MD at 05/08/2015 1:56 PM     Status: Written Related Problem: Paroxysmal atrial fibrillation   Expand All Collapse All   Symptoms are reasonably controlled with metoprolol. She takes 25 mg twice daily around-the-clock as well as additional 25 mg as needed. She is on long-term anticoagulation with no reported side effects.       metoprolol tartrate (LOPRESSOR) 25 MG tablet Reorder

## 2015-06-06 ENCOUNTER — Telehealth: Payer: Self-pay | Admitting: Cardiovascular Disease

## 2015-06-06 MED ORDER — METOPROLOL TARTRATE 25 MG PO TABS: 25.0000 mg | ORAL_TABLET | Freq: Two times a day (BID) | ORAL | Status: DC

## 2015-06-06 MED ORDER — SIMVASTATIN 20 MG PO TABS: 20.0000 mg | ORAL_TABLET | Freq: Every day | ORAL | Status: DC

## 2015-06-06 MED ORDER — RIVAROXABAN 15 MG PO TABS: 15.0000 mg | ORAL_TABLET | Freq: Every day | ORAL | Status: DC

## 2015-06-06 NOTE — Telephone Encounter (Signed)
New problem   Pt stated she a fax sent to [facility] for all her meds she takes for mail order.

## 2015-06-06 NOTE — Telephone Encounter (Signed)
I spoke with the pt and made her aware that I have authorized refills on Metoprolol Tartrate, Simvastatin and Xarelto to [facility].

## 2015-06-27 ENCOUNTER — Telehealth: Payer: Self-pay

## 2015-06-27 NOTE — Telephone Encounter (Signed)
Patient called in asking for Xarelto 15 samples.  I provided 2 bottles.

## 2015-07-25 ENCOUNTER — Telehealth: Payer: Self-pay | Admitting: Cardiovascular Disease

## 2015-07-25 MED ORDER — RIVAROXABAN 15 MG PO TABS: 15.0000 mg | ORAL_TABLET | Freq: Every day | ORAL | Status: DC

## 2015-07-25 NOTE — Telephone Encounter (Signed)
I spoke with the pt and she spoke with the pharmacist and because of further changes in her insurance she will be able to get Xarelto for $43 a month. The pt will be able to afford this medication.  She requested that we send a Rx to [facility] and she will pick it up today.  Rx sent.

## 2015-07-25 NOTE — Telephone Encounter (Signed)
I spoke with the pt and her Xarelto costs $650 per month.  The pt had been getting her medication for free but her insurance "got messed up" and now she is having to pay for Xarelto. The pt took her last dose of Xarelto yesterday and will not have money until tomorrow to get her medication.  At this time the pt cannot come to the office today to pick up samples of Xarelto.  The pt is going to contact her pharmacy to see if she can get one pill for today and check on the exact cost of medication. I will contact the pt shortly to follow-up.

## 2015-07-25 NOTE — Telephone Encounter (Signed)
Pt calling stating that she is no longer able to afford Xarelto 15 mg tablets anymore and would like to know if she could be changed to another medication and it sent to [facility] in [place], [place]. Please advise

## 2015-08-01 ENCOUNTER — Encounter (HOSPITAL_COMMUNITY): Payer: Self-pay | Admitting: *Deleted

## 2015-08-01 ENCOUNTER — Emergency Department (HOSPITAL_COMMUNITY)
Admission: EM | Admit: 2015-08-01 | Discharge: 2015-08-01 | Disposition: A | Discharge status: Home / Self Care | Payer: Commercial Managed Care - HMO | Attending: Emergency Medicine | Admitting: Emergency Medicine

## 2015-08-01 ENCOUNTER — Emergency Department (HOSPITAL_COMMUNITY): Payer: Commercial Managed Care - HMO

## 2015-08-01 DIAGNOSIS — I10 Essential (primary) hypertension: Secondary | ICD-10-CM | POA: Insufficient documentation

## 2015-08-01 DIAGNOSIS — R1030 Lower abdominal pain, unspecified: Secondary | ICD-10-CM | POA: Diagnosis not present

## 2015-08-01 DIAGNOSIS — Z87891 Personal history of nicotine dependence: Secondary | ICD-10-CM | POA: Diagnosis not present

## 2015-08-01 DIAGNOSIS — I2510 Atherosclerotic heart disease of native coronary artery without angina pectoris: Secondary | ICD-10-CM | POA: Diagnosis not present

## 2015-08-01 DIAGNOSIS — Z8673 Personal history of transient ischemic attack (TIA), and cerebral infarction without residual deficits: Secondary | ICD-10-CM | POA: Insufficient documentation

## 2015-08-01 DIAGNOSIS — E785 Hyperlipidemia, unspecified: Secondary | ICD-10-CM | POA: Diagnosis not present

## 2015-08-01 DIAGNOSIS — F419 Anxiety disorder, unspecified: Secondary | ICD-10-CM | POA: Insufficient documentation

## 2015-08-01 DIAGNOSIS — G43909 Migraine, unspecified, not intractable, without status migrainosus: Secondary | ICD-10-CM | POA: Insufficient documentation

## 2015-08-01 DIAGNOSIS — F329 Major depressive disorder, single episode, unspecified: Secondary | ICD-10-CM | POA: Diagnosis not present

## 2015-08-01 DIAGNOSIS — Z79899 Other long term (current) drug therapy: Secondary | ICD-10-CM | POA: Diagnosis not present

## 2015-08-01 DIAGNOSIS — J449 Chronic obstructive pulmonary disease, unspecified: Secondary | ICD-10-CM | POA: Diagnosis not present

## 2015-08-01 LAB — CBC WITH DIFFERENTIAL/PLATELET
Basophils Absolute: 0.1 10*3/uL (ref 0.0–0.1)
Basophils Relative: 1 %
Eosinophils Absolute: 0.4 10*3/uL (ref 0.0–0.7)
Eosinophils Relative: 5 %
HCT: 39.9 % (ref 36.0–46.0)
HEMOGLOBIN: 13.3 g/dL (ref 12.0–15.0)
LYMPHS PCT: 19 %
Lymphs Abs: 1.6 10*3/uL (ref 0.7–4.0)
MCH: 32.6 pg (ref 26.0–34.0)
MCHC: 33.3 g/dL (ref 30.0–36.0)
MCV: 97.8 fL (ref 78.0–100.0)
MONO ABS: 0.4 10*3/uL (ref 0.1–1.0)
MONOS PCT: 5 %
NEUTROS PCT: 70 %
Neutro Abs: 5.9 10*3/uL (ref 1.7–7.7)
Platelets: 212 10*3/uL (ref 150–400)
RBC: 4.08 MIL/uL (ref 3.87–5.11)
RDW: 12.5 % (ref 11.5–15.5)
WBC: 8.4 10*3/uL (ref 4.0–10.5)

## 2015-08-01 LAB — I-STAT CG4 LACTIC ACID, ED: Lactic Acid, Venous: 1.15 mmol/L (ref 0.5–2.0)

## 2015-08-01 LAB — URINALYSIS, ROUTINE W REFLEX MICROSCOPIC
Bilirubin Urine: NEGATIVE
GLUCOSE, UA: NEGATIVE mg/dL
KETONES UR: NEGATIVE mg/dL
Leukocytes, UA: NEGATIVE
Nitrite: NEGATIVE
PROTEIN: NEGATIVE mg/dL
Specific Gravity, Urine: 1.017 (ref 1.005–1.030)
Urobilinogen, UA: 0.2 mg/dL (ref 0.0–1.0)
pH: 5.5 (ref 5.0–8.0)

## 2015-08-01 LAB — COMPREHENSIVE METABOLIC PANEL
ALBUMIN: 3.5 g/dL (ref 3.5–5.0)
ALT: 18 U/L (ref 14–54)
ANION GAP: 8 (ref 5–15)
AST: 24 U/L (ref 15–41)
Alkaline Phosphatase: 73 U/L (ref 38–126)
BUN: 20 mg/dL (ref 6–20)
CHLORIDE: 101 mmol/L (ref 101–111)
CO2: 28 mmol/L (ref 22–32)
Calcium: 9.4 mg/dL (ref 8.9–10.3)
Creatinine, Ser: 1.48 mg/dL — ABNORMAL HIGH (ref 0.44–1.00)
GFR calc Af Amer: 42 mL/min — ABNORMAL LOW (ref >60)
GFR calc non Af Amer: 36 mL/min — ABNORMAL LOW (ref >60)
GLUCOSE: 112 mg/dL — AB (ref 65–99)
POTASSIUM: 3.6 mmol/L (ref 3.5–5.1)
Sodium: 137 mmol/L (ref 135–145)
Total Bilirubin: 0.5 mg/dL (ref 0.3–1.2)
Total Protein: 6.6 g/dL (ref 6.5–8.1)

## 2015-08-01 LAB — URINE MICROSCOPIC-ADD ON

## 2015-08-01 LAB — LIPASE, BLOOD: Lipase: 28 U/L (ref 11–51)

## 2015-08-01 LAB — CBG MONITORING, ED: GLUCOSE-CAPILLARY: 102 mg/dL — AB (ref 65–99)

## 2015-08-01 MED ORDER — HYDROCODONE-ACETAMINOPHEN 5-325 MG PO TABS: 1.0000 | ORAL_TABLET | Freq: Four times a day (QID) | ORAL | Status: DC | PRN

## 2015-08-01 MED ORDER — FENTANYL CITRATE (PF) 100 MCG/2ML IJ SOLN
100.0000 ug | Freq: Once | INTRAMUSCULAR | Status: AC
  Administered 2015-08-01: 100 ug via INTRAVENOUS
  Filled 2015-08-01: qty 2

## 2015-08-01 MED ORDER — IOHEXOL 300 MG/ML  SOLN
80.0000 mL | Freq: Once | INTRAMUSCULAR | Status: AC | PRN
  Administered 2015-08-01: 80 mL via INTRAVENOUS

## 2015-08-01 MED ORDER — ONDANSETRON HCL 4 MG/2ML IJ SOLN
4.0000 mg | Freq: Once | INTRAMUSCULAR | Status: AC
  Administered 2015-08-01: 4 mg via INTRAVENOUS
  Filled 2015-08-01: qty 2

## 2015-08-01 NOTE — Progress Notes (Signed)
This encounter was created in error - please disregard.

## 2015-08-01 NOTE — ED Notes (Signed)
Patient presents via EMS with c/o constant pain in her lower abd - started about MN- the pain was like having her menstrual cycle.  The pain traveled around to her back just above her kidneys.  Denies urinary symptoms.  Lower abd was tender but since the Fentanyl the pain is better

## 2015-08-01 NOTE — ED Notes (Signed)
Patient states she is feeling "light and tingly all over"  Reminded her that she just received pain med.  She then stated that was the way she felt when she received pain med by EMS

## 2015-08-01 NOTE — ED Notes (Signed)
Patient states she doesn't know if she is hungry or if her sugar is dropping but she if feeling shaky.  CBG checked 102

## 2015-08-01 NOTE — ED Notes (Signed)
MD at bedside. 

## 2015-08-01 NOTE — Discharge Instructions (Signed)
If your abdominal pain worsens or won't go away, return to the ER immediately. If you develop vomiting, pain after eating or other concerning symptoms return to the ER for evaluation. Otherwise follow up with the doctor above or any other primary care doctor for follow up in 2 days for a re-check    Emergency Department Resource Guide 1) Find a Doctor and Pay Out of Pocket Although you won't have to find out who is covered by your insurance plan, it is a good idea to ask around and get recommendations. You will then need to call the office and see if the doctor you have chosen will accept you as a new patient and what types of options they offer for patients who are self-pay. Some doctors offer discounts or will set up payment plans for their patients who do not have insurance, but you will need to ask so you aren't surprised when you get to your appointment.  2) Contact Your Local Health Department Not all health departments have doctors that can see patients for sick visits, but many do, so it is worth a call to see if yours does. If you don't know where your local health department is, you can check in your phone book. The CDC also has a tool to help you locate your state's health department, and many state websites also have listings of all of their local health departments.  3) Find a Walk-in Clinic If your illness is not likely to be very severe or complicated, you may want to try a walk in clinic. These are popping up all over the country in pharmacies, drugstores, and shopping centers. They're usually staffed by nurse practitioners or physician assistants that have been trained to treat common illnesses and complaints. They're usually fairly quick and inexpensive. However, if you have serious medical issues or chronic medical problems, these are probably not your best option.  No Primary Care Doctor: - Call Health Connect at  [phone] - they can help you locate a primary care doctor that   accepts your insurance, provides certain services, etc. - Physician Referral Service- [phone]  Chronic Pain Problems: Organization         Address  Phone   Notes  [facility]  [phone] Patients need to be referred by their primary care doctor.   Medication Assistance: Organization         Address  Phone   Notes  [facility] [address] [place], [place] [address] [phone] --Must be a resident of [facility] [place] [facility] -- Must have NO insurance coverage whatsoever (no Medicaid/ Medicare, etc.) -- The pt. MUST have a primary care doctor that directs their care regularly and follows them in the community   MedAssist  [phone]   [employer]  [phone]) [phone]    Agencies that provide inexpensive medical care: Organization         Address  Phone   Notes  [facility]  [phone]   [facility]    [phone]   [facility] [address] [place], [place] [address] [phone]   [facility] [address], [place] [phone]   [facility]    [phone]   [facility]    [phone]   [facility]  [address], [place] Phone:  [phone](336) [phone], Fax:  [phone]) [phone] Hours  of Operation:  9 am - 6 pm, M-F.  Also accepts Medicaid/Medicare and self-pay.  [facility] for [place], [address], [place] Phone: [phone], Fax: [phone]. Hours of Operation:  8:30 am - 5:30 pm, M-F.  Also accepts Medicaid and self-pay.  [facility] [place] [address], [place] Phone: [phone]   [place], [place], [place] [phone], Ext. [phone] Mondays & Thursdays: 7-9 AM.  First 15 patients are seen on a first come, first serve basis.    [place] Providers:  Organization          Address  Phone   Notes  [facility] [address],  [phone] Also accepts self-pay patients.  [facility] [address], [place]  [phone]   [place], [address], [place] [phone]   [facility] [address] [place] [phone]   Lucianne Lei [address], [place]   [phone] Only accepts [place] Access [place] patients after they have their name applied to their card.   Self-Pay (no insurance) in [facility]:  Organization         Address  Phone   Notes  Sickle Cell Patients, [facility] [place] [phone]   [facility] [place] [phone]   [facility] [place]  [place], [place], [place] [phone]   [facility]/Dr. Osei-Bonsu  [address], [place] or [address], [place] [phone] Phone number for both [place] and [place] locations is the same.  [facility] [address] [place] [phone]   [facility] [address], [place] or [address] [phone]   [facility] [address] [place] [phone], phone; [phone], fax Sees patients 1st and 3rd Saturday of every month.  Must not qualify for public or private insurance (i.e. Medicaid, Medicare, [place] Health Choice, Veterans' Benefits)  Household income should be no more than 200% of the poverty level The clinic cannot treat you if you are pregnant or think you are pregnant  Sexually transmitted diseases are not treated at the clinic.    Dental Care: Organization         Address  Phone  Notes  [facility] Department of [facility] [phone] Accepts children up to age 64 who are enrolled in [place] or [place]; pregnant women with a Medicaid card; and children who have applied for Medicaid or [place] Health Choice, but were declined, whose parents can pay a reduced fee at time of service.  [facility] Department of [facility]  [address], [place] [phone] Accepts children up to age 2 who are enrolled in [place] or [place]; pregnant women with a Medicaid card; and children who have applied for Medicaid or [place] Health Choice, but were declined, whose parents can pay a reduced fee at time of service.  [place] [facility] Access PROGRAM  [place] [phone] Patients are seen by appointment only. Walk-ins are not accepted. [place] will see  patients 61 years of age and older. Monday - Tuesday (8am-5pm) Most Wednesdays (8:30-5pm) $30 per visit, cash only  [facility] Adult Dental Access PROGRAM  [address], [facility] [phone] Patients are seen by appointment only. Walk-ins are not accepted. [place] will see patients 29 years of age and older. One Wednesday Evening (Monthly: Volunteer Based).  $30 per visit, cash only  [place]  [phone] for adults; Children under age 71, call Graduate Pediatric Dentistry at [phone]. Children aged 36-14, please call [phone] to request a pediatric application.  Dental services are provided in all areas of dental care including fillings, crowns and bridges, complete and partial dentures, implants, gum treatment, root canals, and extractions. Preventive care is also provided. Treatment is provided to both adults and children. Patients are selected via a lottery and there is often a waiting list.   [facility] [address], [place]  [phone] [email or web address]   [facility] [address] [place], [place]  [phone], Ext. [phone] Second and Fourth Thursday of each month, opens at 6:30 AM; Clinic ends at 9 AM.  Patients are seen on a first-come first-served basis, and a limited number are seen during each clinic.   [facility]  [address] Hillard Danker [place], [place] [phone]   Eligibility Requirements You must have lived in [place], [place], or [place] counties for at least the last three months.   You cannot be eligible for state or federal sponsored [employer], including [employer], [place], or [employer].   You generally cannot be eligible for healthcare insurance through your employer.    How to apply: Eligibility screenings are held every Tuesday and Wednesday afternoon from 1:00 pm until 4:00 pm. You do not need an appointment for the interview!  [facility] [address] [place], [place]   [place]  [place] Department  [place]  [phone]    Behavioral Health Resources in the Community: Intensive Outpatient Programs Organization         Address  Phone  Notes  Leona Louin. [address] [place], [place] [phone]   [facility] Outpatient [address], [place], [place]   ADS: [facility] Drug S[facility] [address] [place], [place]   [place] [address]  [place], [place] or [phone]   Substance Abuse Resources Organization         Address  Phone  Notes  Alcohol and Drug Services  [phone]   [place]  [phone]   [facility] [place]   Chinita Pester  [phone]   Residential & Outpatient Substance Abuse Program  [phone]   Psychological Services Organization         Address  Phone  Notes  [facility]  [place]  [phone]    [place] [address] [place] or [phone]    Mobile Crisis Teams Organization         Address  Phone  Notes  [facility], Mobile Crisis Care Unit  [phone]   [facility]  [address] [place], [place]   [facility] [address] [place] [phone]    Self-Help/Support Groups Organization  Address  Phone             Notes  [facility] [place] - variety of support groups  [phone]- [phone] Call for more information  Narcotics Anonymous (NA), [facility] [address], [employer] [place]  2 meetings at this location   Statistician         Address  Phone  Notes  [facility] [address],    [place]  [phone]   [facility]  [address] [place] [address], [place], [place] [phone]   [facility] [address] [place], [place] [phone] Admissions: 8am-3pm M-F  [facility] [address]    [place], [place] [phone]   [facility] [address] [place], [place], [place] [phone]   [facility] [address]  [place], [place] [phone]   [facility] - Intensive Outpatient [address], [place], [place] [phone]   [facility] (Addiction Recovery Care [facility]oc[facility] [address] [place].,  [place], [place] [phone] or [phone]   [facility] ([facility]) [address] [place], [place] [phone] Accepts Medicaid  [facility] [place] [address],  [place] [phone] Substance Abuse/Addiction Treatment   [facility] Organization         Address  Phone  Notes  [facility]  [phone]   Angie Fava, PhD [address] Ervin Knack [place], [place]   [phone] or [phone]   [facility] Behavioral   [address] [place], [place] [phone]   [facility] [address], [place], [place] [phone] Insurance/Medicaid/sponsorship through [facility] and Families [address]                                    [place], [place] [phone] Therapy/tele-psych/case  [facility] [address], [place] [phone]    Dr. Lolly Mustache  [phone]   [facility]  United Way [facility] Dept. 1) [address], [place] 2) [address] [place] 3)  [address], [place] [phone]  [phone]   [facility] Child Abuse Hotline [phone] or [phone] (After Hours)

## 2015-08-01 NOTE — ED Provider Notes (Signed)
CSN: [id number]     Arrival date & time 08/01/15  0354 History   First MD Initiated Contact with Patient 08/01/15 0355     Chief Complaint  Patient presents with  . Abdominal Pain     (Consider location/radiation/quality/duration/timing/severity/associated sxs/prior Treatment) HPI  64 year old female presents with acute lower abdominal pain that started around 4 hours ago. Started as mild cramping that progressively worsened. Denies any urinary symptoms. States she has not had constipation or diarrhea, last bowel movement yesterday. Patient states that the pain is better after being given 150 g of fentanyl by EMS. Was nauseated when the pain first started but now that is resolved. No vomiting. No urinary symptoms. Feels like the pain wraps around or lower back. No vaginal bleeding.  Past Medical History  Diagnosis Date  . Coronary artery disease     non obstructive  . Stroke (HCC)   . Seizures (HCC)   . Anxiety   . Depression   . Migraine   . Fibromuscular dysplasia (HCC)   . Tobacco abuse   . COPD (chronic obstructive pulmonary disease) (HCC)   . Arthritis of knee     Bilateral  . Tobacco abuse   . Migraine headache   . Anxiety disorder   . Depression   . Seizure disorder (HCC)   . Cerebrovascular disease   . CAD (coronary artery disease)   . Automatic implantable cardiac defibrillator in situ     [employer] ICD  . HTN (hypertension)   . HLD (hyperlipidemia)   . Ventricular fibrillation (HCC)   . Long QT syndrome   . Cardiac arrest (HCC)   . Atrial fibrillation (HCC)     a) 2D echo 09/11/12: LVEF 55-60%, mild LVH, mld LA dilatation  . Allergy    Past Surgical History  Procedure Laterality Date  . Pacemaker insertion  05/01/2003    ICD, implantation of a Guidant single chamber defibrillator, Doylene Canning. Ladona Ridgel MD  . Cholecystectomy    . Tubal ligation    . Breast lumpectomy    . Appendectomy    . Cosmetic surgery     Family History  Problem Relation Age of  Onset  . Dementia Mother   . Stroke Mother     Multiple  . Aneurysm Sister     Brain  . Stroke Sister   . Uterine cancer Other     Grandmother  . Colon cancer Sister   . Cancer Sister    Social History  Substance Use Topics  . Smoking status: Former Smoker -- 0.25 packs/day for 40 years    Types: Cigarettes  . Smokeless tobacco: Never Used     Comment: almost a pack a week  . Alcohol Use: No   OB History    No data available     Review of Systems  Constitutional: Negative for fever.  Gastrointestinal: Positive for nausea and abdominal pain. Negative for vomiting, diarrhea and constipation.  Genitourinary: Positive for flank pain. Negative for dysuria.  Musculoskeletal: Positive for back pain.  All other systems reviewed and are negative.     Allergies  Azithromycin; Ibuprofen; Codeine; and Demerol  Home Medications   Prior to Admission medications   Medication Sig Start Date End Date Taking? Authorizing Provider  acetaminophen (TYLENOL) 500 MG tablet Take 500 mg by mouth every 6 (six) hours as needed for headache.    Historical Provider, MD  albuterol (PROVENTIL HFA;VENTOLIN HFA) 108 (90 BASE) MCG/ACT inhaler Inhale 2 puffs into the lungs every  6 (six) hours as needed for wheezing.    Historical Provider, MD  diazepam (VALIUM) 5 MG tablet Take 1 tablet (5 mg total) by mouth every 12 (twelve) hours as needed for anxiety. Patient taking differently: Take 5 mg by mouth 3 (three) times daily.  06/27/14   Iran Ouch, MD  diphenhydrAMINE (BENADRYL) 25 MG tablet Take 50 mg by mouth every 6 (six) hours as needed for allergies or sleep.     Historical Provider, MD  metoprolol tartrate (LOPRESSOR) 25 MG tablet Take 1 tablet (25 mg total) by mouth 2 (two) times daily. 06/06/15   Iran Ouch, MD  mirtazapine (REMERON) 15 MG tablet Take 15 mg by mouth 3 times/day as needed-between meals & bedtime (sleep).  10/20/14   Historical Provider, MD  potassium chloride SA  (K-DUR,KLOR-CON) 20 MEQ tablet Take 1 tablet (20 mEq total) by mouth daily. 05/31/14   Pricilla Loveless, MD  Rivaroxaban (XARELTO) 15 MG TABS tablet Take 1 tablet (15 mg total) by mouth at bedtime. 07/25/15   Iran Ouch, MD  sertraline (ZOLOFT) 100 MG tablet Take 200 mg by mouth at bedtime.     Historical Provider, MD  simvastatin (ZOCOR) 20 MG tablet Take 1 tablet (20 mg total) by mouth at bedtime. 06/06/15   Iran Ouch, MD   BP 133/86 mmHg  Pulse 66  Temp(Src) 97.6 F (36.4 C) (Oral)  Resp 23  Ht  (1.626 m)  Wt 160 lb (72.576 kg)  BMI 27.45 kg/m2  SpO2 97% Physical Exam  Constitutional: She is oriented to person, place, and time. She appears well-developed and well-nourished.  HENT:  Head: Normocephalic and atraumatic.  Right Ear: External ear normal.  Left Ear: External ear normal.  Nose: Nose normal.  Eyes: Right eye exhibits no discharge. Left eye exhibits no discharge.  Cardiovascular: Normal rate, regular rhythm and normal heart sounds.   Pulmonary/Chest: Effort normal and breath sounds normal.  Abdominal: Soft. There is tenderness in the right lower quadrant, suprapubic area and left lower quadrant. There is guarding. There is no rigidity.  Neurological: She is alert and oriented to person, place, and time.  Skin: Skin is warm and dry.  Nursing note and vitals reviewed.   ED Course  Procedures (including critical care time) Labs Review Labs Reviewed  COMPREHENSIVE METABOLIC PANEL - Abnormal; Notable for the following:    Glucose, Bld 112 (*)    Creatinine, Ser 1.48 (*)    GFR calc non Af Amer 36 (*)    GFR calc Af Amer 42 (*)    All other components within normal limits  URINALYSIS, ROUTINE W REFLEX MICROSCOPIC (NOT AT [facility]) - Abnormal; Notable for the following:    APPearance CLOUDY (*)    Hgb urine dipstick TRACE (*)    All other components within normal limits  URINE MICROSCOPIC-ADD ON - Abnormal; Notable for the following:    Squamous Epithelial /  LPF FEW (*)    Bacteria, UA FEW (*)    All other components within normal limits  CBG MONITORING, ED - Abnormal; Notable for the following:    Glucose-Capillary 102 (*)    All other components within normal limits  LIPASE, BLOOD  CBC WITH DIFFERENTIAL/PLATELET  I-STAT CG4 LACTIC ACID, ED    Imaging Review Ct Abdomen Pelvis W Contrast  08/01/2015  CLINICAL DATA:  Sudden onset lower abdominal pain radiating to the back. EXAM: CT ABDOMEN AND PELVIS WITH CONTRAST TECHNIQUE: Multidetector CT imaging of the abdomen  and pelvis was performed using the standard protocol following bolus administration of intravenous contrast. CONTRAST:  80mL OMNIPAQUE IOHEXOL 300 MG/ML  SOLN COMPARISON:  None. Chest CT from 03/26/2014 is not currently retrievable from the archive. FINDINGS: Lower chest and abdominal wall: No acute finding. Single chamber ICD/ pacer lead into the right ventricular apex. Hepatobiliary: Multiple benign hepatic cysts, including 37 mm cyst in the high central liver.Cholecystectomy with dilated common bile duct at 11 mm, likely reservoir effect given liver function tests. Pancreas: Unremarkable. Spleen: Incidental cysts or pseudocysts.  No significant finding. Adrenals/Urinary Tract: Negative adrenals. Numerous renal cysts without mural thickening or internal density to suggest superimposed hemorrhage or infection as cause of symptoms. No hydronephrosis or stone. Unremarkable bladder. Reproductive:No pathologic findings. Stomach/Bowel: No obstruction. No pericecal inflammation. Appendectomy. 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 Downstream vessel is fusiformly dilated with additional anterior low-density plaque causing mild stenosis. No major branch occlusion or dissection flap to explain pain. 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 No mass or adenopathy. Peritoneal: No ascites or pneumoperitoneum. Musculoskeletal: No acute  finding. 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 This is known from MRI report 08/27/1998. Disc degeneration L5-S1 and L2-3 is advanced. IMPRESSION: 1. No acute finding. 2. Polycystic kidney disease without complicated cyst to explain pain. 3. 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 4. Incidental findings noted above. Electronically Signed   By: Marnee SpringJonathon  Watts M.D.   On: 08/01/2015 06:23   I have personally reviewed and evaluated these images and lab results as part of my medical decision-making.   EKG Interpretation None      MDM   Final diagnoses:  Lower abdominal pain    It is unclear why the patient is having acute lower abdominal pain. Pain essentially resolved now. No pain with eating and this was not related to food. CT shows polycystic kidneys but this would not explain pain. Has atherosclerosis of proximal SMA but no downstream effects and with normal WBC and lactic acid with no signs of occlusion I highly doubt mesenteric ischemia. Repeat exam shows no guarding or tenderness. No UTI. Plan to treat with pain meds and recommend close outpatient follow up and return if any symptoms recur or worsen.    Pricilla LovelessScott Isidro Monks, MD 08/01/15 [phone]

## 2015-08-09 ENCOUNTER — Telehealth: Payer: Self-pay

## 2015-08-23 ENCOUNTER — Telehealth: Payer: Self-pay | Admitting: Cardiovascular Disease

## 2015-08-23 NOTE — Telephone Encounter (Signed)
° ° °  Patient calling the office for samples of medication: ° ° °1.  What medication and dosage are you requesting samples for? xarelto ° °2.  Are you currently out of this medication? yes ° ° °

## 2015-08-23 NOTE — Telephone Encounter (Signed)
Patient provided with 1 bottle of Xarelto 15mg . Her co-pay for 30 days should be $43.00.

## 2015-08-23 NOTE — Telephone Encounter (Signed)
F/u  Pt following up on xarelto samples, she wants to come in today. Please call back and discuss.

## 2015-09-25 ENCOUNTER — Other Ambulatory Visit: Payer: Self-pay | Admitting: Cardiovascular Disease

## 2015-10-12 ENCOUNTER — Encounter: Payer: Self-pay | Admitting: Family Medicine

## 2015-10-17 ENCOUNTER — Encounter: Payer: Self-pay | Admitting: Family Medicine

## 2015-10-21 ENCOUNTER — Encounter (HOSPITAL_COMMUNITY): Payer: Self-pay | Admitting: *Deleted

## 2015-10-21 ENCOUNTER — Emergency Department (HOSPITAL_COMMUNITY)
Admission: EM | Admit: 2015-10-21 | Discharge: 2015-10-21 | Disposition: A | Discharge status: Home / Self Care | Payer: Medicare Other | Source: Home / Self Care | Attending: Emergency Medicine | Admitting: Emergency Medicine

## 2015-10-21 DIAGNOSIS — F17210 Nicotine dependence, cigarettes, uncomplicated: Secondary | ICD-10-CM

## 2015-10-21 DIAGNOSIS — F419 Anxiety disorder, unspecified: Secondary | ICD-10-CM | POA: Insufficient documentation

## 2015-10-21 DIAGNOSIS — R531 Weakness: Secondary | ICD-10-CM

## 2015-10-21 DIAGNOSIS — Z792 Long term (current) use of antibiotics: Secondary | ICD-10-CM

## 2015-10-21 DIAGNOSIS — J3489 Other specified disorders of nose and nasal sinuses: Secondary | ICD-10-CM

## 2015-10-21 DIAGNOSIS — J449 Chronic obstructive pulmonary disease, unspecified: Secondary | ICD-10-CM | POA: Insufficient documentation

## 2015-10-21 DIAGNOSIS — F329 Major depressive disorder, single episode, unspecified: Secondary | ICD-10-CM | POA: Insufficient documentation

## 2015-10-21 DIAGNOSIS — Z886 Allergy status to analgesic agent status: Secondary | ICD-10-CM | POA: Diagnosis not present

## 2015-10-21 DIAGNOSIS — N390 Urinary tract infection, site not specified: Secondary | ICD-10-CM | POA: Diagnosis not present

## 2015-10-21 DIAGNOSIS — I10 Essential (primary) hypertension: Secondary | ICD-10-CM | POA: Insufficient documentation

## 2015-10-21 DIAGNOSIS — G40909 Epilepsy, unspecified, not intractable, without status epilepticus: Secondary | ICD-10-CM | POA: Insufficient documentation

## 2015-10-21 DIAGNOSIS — N183 Chronic kidney disease, stage 3 (moderate): Secondary | ICD-10-CM | POA: Diagnosis not present

## 2015-10-21 DIAGNOSIS — Z8673 Personal history of transient ischemic attack (TIA), and cerebral infarction without residual deficits: Secondary | ICD-10-CM

## 2015-10-21 DIAGNOSIS — I4891 Unspecified atrial fibrillation: Secondary | ICD-10-CM | POA: Insufficient documentation

## 2015-10-21 DIAGNOSIS — Z95810 Presence of automatic (implantable) cardiac defibrillator: Secondary | ICD-10-CM

## 2015-10-21 DIAGNOSIS — R404 Transient alteration of awareness: Secondary | ICD-10-CM | POA: Diagnosis not present

## 2015-10-21 DIAGNOSIS — Z883 Allergy status to other anti-infective agents status: Secondary | ICD-10-CM | POA: Diagnosis not present

## 2015-10-21 DIAGNOSIS — A084 Viral intestinal infection, unspecified: Secondary | ICD-10-CM | POA: Diagnosis not present

## 2015-10-21 DIAGNOSIS — B9620 Unspecified Escherichia coli [E. coli] as the cause of diseases classified elsewhere: Secondary | ICD-10-CM | POA: Diagnosis not present

## 2015-10-21 DIAGNOSIS — R112 Nausea with vomiting, unspecified: Secondary | ICD-10-CM | POA: Insufficient documentation

## 2015-10-21 DIAGNOSIS — I2510 Atherosclerotic heart disease of native coronary artery without angina pectoris: Secondary | ICD-10-CM | POA: Insufficient documentation

## 2015-10-21 DIAGNOSIS — I4581 Long QT syndrome: Secondary | ICD-10-CM | POA: Diagnosis not present

## 2015-10-21 DIAGNOSIS — E860 Dehydration: Secondary | ICD-10-CM | POA: Diagnosis not present

## 2015-10-21 DIAGNOSIS — R197 Diarrhea, unspecified: Secondary | ICD-10-CM | POA: Insufficient documentation

## 2015-10-21 DIAGNOSIS — I129 Hypertensive chronic kidney disease with stage 1 through stage 4 chronic kidney disease, or unspecified chronic kidney disease: Secondary | ICD-10-CM | POA: Diagnosis not present

## 2015-10-21 DIAGNOSIS — Z7901 Long term (current) use of anticoagulants: Secondary | ICD-10-CM | POA: Insufficient documentation

## 2015-10-21 DIAGNOSIS — M170 Bilateral primary osteoarthritis of knee: Secondary | ICD-10-CM

## 2015-10-21 DIAGNOSIS — N179 Acute kidney failure, unspecified: Secondary | ICD-10-CM | POA: Diagnosis not present

## 2015-10-21 DIAGNOSIS — R040 Epistaxis: Secondary | ICD-10-CM | POA: Insufficient documentation

## 2015-10-21 DIAGNOSIS — E785 Hyperlipidemia, unspecified: Secondary | ICD-10-CM | POA: Insufficient documentation

## 2015-10-21 DIAGNOSIS — R069 Unspecified abnormalities of breathing: Secondary | ICD-10-CM | POA: Diagnosis not present

## 2015-10-21 DIAGNOSIS — R42 Dizziness and giddiness: Secondary | ICD-10-CM | POA: Diagnosis not present

## 2015-10-21 DIAGNOSIS — J329 Chronic sinusitis, unspecified: Secondary | ICD-10-CM | POA: Diagnosis not present

## 2015-10-21 DIAGNOSIS — R55 Syncope and collapse: Secondary | ICD-10-CM | POA: Diagnosis not present

## 2015-10-21 DIAGNOSIS — I480 Paroxysmal atrial fibrillation: Secondary | ICD-10-CM | POA: Diagnosis not present

## 2015-10-21 DIAGNOSIS — Z23 Encounter for immunization: Secondary | ICD-10-CM | POA: Diagnosis not present

## 2015-10-21 LAB — URINALYSIS, ROUTINE W REFLEX MICROSCOPIC
Bilirubin Urine: NEGATIVE
Glucose, UA: NEGATIVE mg/dL
Ketones, ur: NEGATIVE mg/dL
NITRITE: POSITIVE — AB
PROTEIN: NEGATIVE mg/dL
Specific Gravity, Urine: 1.029 (ref 1.005–1.030)
pH: 5 (ref 5.0–8.0)

## 2015-10-21 LAB — CBC
HEMATOCRIT: 42.6 % (ref 36.0–46.0)
HEMOGLOBIN: 14 g/dL (ref 12.0–15.0)
MCH: 31.7 pg (ref 26.0–34.0)
MCHC: 32.9 g/dL (ref 30.0–36.0)
MCV: 96.4 fL (ref 78.0–100.0)
Platelets: 240 10*3/uL (ref 150–400)
RBC: 4.42 MIL/uL (ref 3.87–5.11)
RDW: 12.8 % (ref 11.5–15.5)
WBC: 6.6 10*3/uL (ref 4.0–10.5)

## 2015-10-21 LAB — COMPREHENSIVE METABOLIC PANEL
ALBUMIN: 3.4 g/dL — AB (ref 3.5–5.0)
ALT: 16 U/L (ref 14–54)
AST: 21 U/L (ref 15–41)
Alkaline Phosphatase: 66 U/L (ref 38–126)
Anion gap: 11 (ref 5–15)
BILIRUBIN TOTAL: 0.3 mg/dL (ref 0.3–1.2)
BUN: 25 mg/dL — ABNORMAL HIGH (ref 6–20)
CHLORIDE: 106 mmol/L (ref 101–111)
CO2: 22 mmol/L (ref 22–32)
Calcium: 9.3 mg/dL (ref 8.9–10.3)
Creatinine, Ser: 1.67 mg/dL — ABNORMAL HIGH (ref 0.44–1.00)
GFR calc Af Amer: 36 mL/min — ABNORMAL LOW (ref >60)
GFR calc non Af Amer: 31 mL/min — ABNORMAL LOW (ref >60)
Glucose, Bld: 89 mg/dL (ref 65–99)
POTASSIUM: 4.1 mmol/L (ref 3.5–5.1)
SODIUM: 139 mmol/L (ref 135–145)
TOTAL PROTEIN: 6.3 g/dL — AB (ref 6.5–8.1)

## 2015-10-21 LAB — I-STAT CG4 LACTIC ACID, ED
LACTIC ACID, VENOUS: 1.38 mmol/L (ref 0.5–2.0)
Lactic Acid, Venous: 0.75 mmol/L (ref 0.5–2.0)

## 2015-10-21 LAB — URINE MICROSCOPIC-ADD ON

## 2015-10-21 LAB — I-STAT TROPONIN, ED: TROPONIN I, POC: 0 ng/mL (ref 0.00–0.08)

## 2015-10-21 MED ORDER — DIPHENHYDRAMINE HCL 50 MG/ML IJ SOLN
25.0000 mg | Freq: Once | INTRAMUSCULAR | Status: AC
  Administered 2015-10-21: 25 mg via INTRAVENOUS
  Filled 2015-10-21: qty 1

## 2015-10-21 MED ORDER — ACETAMINOPHEN 325 MG PO TABS
650.0000 mg | ORAL_TABLET | Freq: Once | ORAL | Status: DC
  Filled 2015-10-21: qty 2

## 2015-10-21 MED ORDER — METOCLOPRAMIDE HCL 5 MG/ML IJ SOLN
10.0000 mg | Freq: Once | INTRAMUSCULAR | Status: AC
  Administered 2015-10-21: 10 mg via INTRAVENOUS
  Filled 2015-10-21: qty 2

## 2015-10-21 MED ORDER — DEXTROSE 5 % IV SOLN
1.0000 g | Freq: Once | INTRAVENOUS | Status: AC
  Administered 2015-10-21: 1 g via INTRAVENOUS
  Filled 2015-10-21: qty 10

## 2015-10-21 MED ORDER — FENTANYL CITRATE (PF) 100 MCG/2ML IJ SOLN
50.0000 ug | Freq: Once | INTRAMUSCULAR | Status: AC
  Administered 2015-10-21: 50 ug via INTRAVENOUS
  Filled 2015-10-21: qty 2

## 2015-10-21 MED ORDER — SODIUM CHLORIDE 0.9 % IV BOLUS (SEPSIS)
1000.0000 mL | Freq: Once | INTRAVENOUS | Status: AC
  Administered 2015-10-21: 1000 mL via INTRAVENOUS

## 2015-10-21 MED ORDER — CEPHALEXIN 500 MG PO CAPS: 500.0000 mg | ORAL_CAPSULE | Freq: Two times a day (BID) | ORAL | Status: DC

## 2015-10-21 NOTE — ED Provider Notes (Signed)
CSN: [id number]     Arrival date & time 10/21/15  1419 History   First MD Initiated Contact with Patient 10/21/15 1459     No chief complaint on file.    (Consider location/radiation/quality/duration/timing/severity/associated sxs/prior Treatment) HPI   4 y f w cad, anxiety, depression, copd, cad, long qt syndrome s/p aicd, previous cardiac arrest who comes in with a few days of nausea, NBNB emesis, loose stools w/o blood, no fever chills.  Patient has had some generalized weakness and has felt pre-syncope a couple of times upon standing over the past few days but hasn't fainted.  She denies chest pain/sob.  She has had some rhinorrhea with this illness and had a self resolving episode of epistaxis yesterday as well as some frontal facial pressure.  Past Medical History  Diagnosis Date  . Coronary artery disease     non obstructive  . Seizures (HCC)   . Anxiety   . Depression   . Migraine   . Fibromuscular dysplasia (HCC)   . Tobacco abuse   . COPD (chronic obstructive pulmonary disease) (HCC)   . Arthritis of knee     Bilateral  . Tobacco abuse   . Migraine headache   . Anxiety disorder   . Depression   . Seizure disorder (HCC)   . Cerebrovascular disease   . CAD (coronary artery disease)   . Automatic implantable cardiac defibrillator in situ     [employer] ICD  . HTN (hypertension)   . HLD (hyperlipidemia)   . Ventricular fibrillation (HCC)   . Long QT syndrome   . Cardiac arrest (HCC)   . Atrial fibrillation (HCC)     a) 2D echo 09/11/12: LVEF 55-60%, mild LVH, mld LA dilatation  . Allergy   . Stroke [facility])     no deficits   Past Surgical History  Procedure Laterality Date  . Pacemaker insertion  05/01/2003    ICD, implantation of a Guidant single chamber defibrillator, Doylene Canning. Ladona Ridgel MD  . Cholecystectomy    . Tubal ligation    . Breast lumpectomy    . Appendectomy    . Cosmetic surgery     Family History  Problem Relation Age of Onset  . Dementia  Mother   . Stroke Mother     Multiple  . Aneurysm Sister     Brain  . Stroke Sister   . Uterine cancer Other     Grandmother  . Colon cancer Sister   . Cancer Sister    Social History  Substance Use Topics  . Smoking status: Current Every Day Smoker -- 0.25 packs/day for 40 years    Types: Cigarettes  . Smokeless tobacco: Never Used     Comment: almost a pack a week  . Alcohol Use: No   OB History    No data available     Review of Systems  Constitutional: Negative for fever and chills.  HENT: Positive for nosebleeds and rhinorrhea.   Eyes: Negative for visual disturbance.  Respiratory: Negative for cough and shortness of breath.   Cardiovascular: Negative for chest pain.  Gastrointestinal: Positive for nausea, vomiting and diarrhea. Negative for abdominal pain and constipation.  Genitourinary: Negative for dysuria.  Skin: Negative for rash.  Neurological: Negative for weakness.  All other systems reviewed and are negative.     Allergies  Azithromycin; Ibuprofen; Codeine; and Demerol  Home Medications   Prior to Admission medications   Medication Sig Start Date End Date Taking? Authorizing Provider  acetaminophen (TYLENOL) 500 MG tablet Take 1,000 mg by mouth every 6 (six) hours as needed for headache (pain).    Yes Historical Provider, MD  albuterol (PROVENTIL HFA;VENTOLIN HFA) 108 (90 BASE) MCG/ACT inhaler Inhale 2 puffs into the lungs every 6 (six) hours as needed for wheezing.   Yes Historical Provider, MD  diazepam (VALIUM) 5 MG tablet Take 1 tablet (5 mg total) by mouth every 12 (twelve) hours as needed for anxiety. Patient taking differently: Take 5 mg by mouth 3 (three) times daily as needed for anxiety.  06/27/14  Yes Iran Ouch, MD  diphenhydrAMINE (BENADRYL) 25 MG tablet Take 50 mg by mouth every 6 (six) hours as needed for allergies or sleep.    Yes Historical Provider, MD  loperamide (IMODIUM A-D) 2 MG tablet Take 2 mg by mouth 4 (four) times daily  as needed for diarrhea or loose stools.   Yes Historical Provider, MD  metoprolol tartrate (LOPRESSOR) 25 MG tablet Take 1 tablet (25 mg total) by mouth 2 (two) times daily. Patient taking differently: Take 25 mg by mouth See admin instructions. Take 1 tablet (25 mg) by mouth twice daily, may take an additional tablet mid-day as needed for afib 06/06/15  Yes Iran Ouch, MD  mirtazapine (REMERON) 15 MG tablet Take 15-30 mg by mouth at bedtime as needed (sleep).  10/20/14  Yes Historical Provider, MD  oxymetazoline (AFRIN) 0.05 % nasal spray Place 2 sprays into both nostrils 2 (two) times daily as needed for congestion.   Yes Historical Provider, MD  Rivaroxaban (XARELTO) 15 MG TABS tablet Take 1 tablet (15 mg total) by mouth at bedtime. 07/25/15  Yes Iran Ouch, MD  sertraline (ZOLOFT) 100 MG tablet Take 200 mg by mouth at bedtime.    Yes Historical Provider, MD  simvastatin (ZOCOR) 20 MG tablet Take 1 tablet (20 mg total) by mouth at bedtime. 06/06/15  Yes Iran Ouch, MD  cephALEXin (KEFLEX) 500 MG capsule Take 1 capsule (500 mg total) by mouth 2 (two) times daily. 10/21/15 10/28/15  Silas Flood, MD  HYDROcodone-acetaminophen (NORCO) 5-325 MG tablet Take 1 tablet by mouth every 6 (six) hours as needed for severe pain. Patient not taking: Reported on 10/21/2015 08/01/15   Pricilla Loveless, MD  potassium chloride SA (K-DUR,KLOR-CON) 20 MEQ tablet Take 1 tablet (20 mEq total) by mouth daily. Patient not taking: Reported on 08/01/2015 05/31/14   Pricilla Loveless, MD  simvastatin (ZOCOR) 20 MG tablet TAKE ONE TABLET BY MOUTH AT BEDTIME Patient not taking: Reported on 10/21/2015 09/25/15   Iran Ouch, MD   BP 109/72 mmHg  Pulse 62  Temp(Src) 97.4 F (36.3 C) (Oral)  Resp 16  Ht  (1.626 m)  Wt 72.576 kg  BMI 27.45 kg/m2  SpO2 96% Physical Exam  Constitutional: She is oriented to person, place, and time. No distress.  HENT:  Head: Normocephalic and atraumatic.  Eyes: EOM are normal.  Pupils are equal, round, and reactive to light.  Neck: Normal range of motion. Neck supple.  Cardiovascular: Normal rate and intact distal pulses.   Pulmonary/Chest: Effort normal and breath sounds normal. No respiratory distress.  Abdominal: Soft. There is no tenderness. There is no rebound and no guarding.  Musculoskeletal: Normal range of motion.  Neurological: She is alert and oriented to person, place, and time.  Skin: No rash noted. She is not diaphoretic.  Psychiatric: She has a normal mood and affect.    ED Course  Procedures (including  critical care time) Labs Review Labs Reviewed  COMPREHENSIVE METABOLIC PANEL - Abnormal; Notable for the following:    BUN 25 (*)    Creatinine, Ser 1.67 (*)    Total Protein 6.3 (*)    Albumin 3.4 (*)    GFR calc non Af Amer 31 (*)    GFR calc Af Amer 36 (*)    All other components within normal limits  URINALYSIS, ROUTINE W REFLEX MICROSCOPIC (NOT AT [facility]) - Abnormal; Notable for the following:    Color, Urine AMBER (*)    APPearance CLOUDY (*)    Hgb urine dipstick TRACE (*)    Nitrite POSITIVE (*)    Leukocytes, UA MODERATE (*)    All other components within normal limits  URINE MICROSCOPIC-ADD ON - Abnormal; Notable for the following:    Squamous Epithelial / LPF 0-5 (*)    Bacteria, UA MANY (*)    All other components within normal limits  GASTROINTESTINAL PANEL BY PCR, STOOL (REPLACES STOOL CULTURE)  URINE CULTURE  CBC  I-STAT CG4 LACTIC ACID, ED  I-STAT CG4 LACTIC ACID, ED  I-STAT TROPOININ, ED    Imaging Review No results found. I have personally reviewed and evaluated these images and lab results as part of my medical decision-making.   EKG Interpretation   Date/Time:  Sunday October 21 2015 16:38:44 EST Ventricular Rate:  61 PR Interval:  159 QRS Duration: 94 QT Interval:  458 QTC Calculation: 461 R Axis:   58 Text Interpretation:  Sinus rhythm Borderline low voltage, extremity leads  Abnormal T, consider  ischemia, diffuse leads Confirmed by ZAVITZ  MD,  JOSHUA ([id number]) on 10/21/2015 6:44:02 PM      MDM   Final diagnoses:  Diarrhea, unspecified type    23 y f w cad, anxiety, depression, copd, cad, long qt syndrome s/p aicd, previous cardiac arrest who comes in with a few days of nausea, NBNB emesis, loose stools w/o blood, no fever chills.  Exam as above.  Concern for dehydration 2/2 diarrhea and emesis.  Will obtain ekg, cbc/cmp/gipp/c. Diff.  Will give fluid bolus.  Pt with some soft pressures in the 90's, will monitor.  Cbc/cmp unremarkable.  Pt unable to have bowel movement so unable to send GIPP and c. Diff.  I have obtained ekg which showed no wpw, long qt or brugada at this time but did show some t-wave inversions.  Pt has no chest pain/sob but I have sent a trop to further stratify.  Trop neg. ua sent and appears infected, will treat with rocephin here and d/c on keflex  I have interogatted patients AICD and it was only remarkable for a single episode of afib w/ rvr last night that lasted for 15 seconds, nothing during the day that would have caused her intermittent lightheadedness.  I have given an additional L of NS and observed in the ED.  After period of obs pt is feeling better, bp's continue to be stable, doubt sepsis, doubt dangerous intra-abdominal infection.  feel safe for d/c at this time.  I have discussed the results, Dx and Tx plan with the pt. They expressed understanding and agree with the plan and were told to return to ED with any worsening of condition or concern.    Disposition: Discharge  Condition: Good  Discharge Medication List as of 10/21/2015  9:43 PM    START taking these medications   Details  cephALEXin (KEFLEX) 500 MG capsule Take 1 capsule (500 mg total) by mouth  2 (two) times daily., Starting 10/21/2015, Until Sun 10/28/15, Print        Follow Up: Pearline Cables, MD [address] [place] [address] [phone]  In 1 day    Pt seen  in conjunction with Dr. Nelda Bucks, MD 10/22/15 1310  Blane Ohara, MD 10/27/15 [phone]

## 2015-10-21 NOTE — ED Notes (Signed)
Pt here via [facility] for rhinitis, headache, vomiting, watery diarrhea and vomiting for several days. Pt has felt very weak and almost passed out twice in the last 2 weeks.  EMS states pt was not orthostatic. CBG 101.

## 2015-10-21 NOTE — ED Notes (Signed)
Informed MD that labs have resulted, plan to discharge, contacted family for transportation at discharge

## 2015-10-21 NOTE — ED Notes (Signed)
Waiting for interrogation report; [employer] received information, waiting for results.   Pt c/o headache, MD notified

## 2015-10-21 NOTE — Discharge Instructions (Signed)
Urinary Tract Infection Urinary tract infections (UTIs) can develop anywhere along your urinary tract. Your urinary tract is your body's drainage system for removing wastes and extra water. Your urinary tract includes two kidneys, two ureters, a bladder, and a urethra. Your kidneys are a pair of bean-shaped organs. Each kidney is about the size of your fist. They are located below your ribs, one on each side of your spine. CAUSES Infections are caused by microbes, which are microscopic organisms, including fungi, viruses, and bacteria. These organisms are so small that they can only be seen through a microscope. Bacteria are the microbes that most commonly cause UTIs. SYMPTOMS  Symptoms of UTIs may vary by age and gender of the patient and by the location of the infection. Symptoms in young women typically include a frequent and intense urge to urinate and a painful, burning feeling in the bladder or urethra during urination. Older women and men are more likely to be tired, shaky, and weak and have muscle aches and abdominal pain. A fever may mean the infection is in your kidneys. Other symptoms of a kidney infection include pain in your back or sides below the ribs, nausea, and vomiting. DIAGNOSIS To diagnose a UTI, your caregiver will ask you about your symptoms. Your caregiver will also ask you to provide a urine sample. The urine sample will be tested for bacteria and white blood cells. White blood cells are made by your body to help fight infection. TREATMENT  Typically, UTIs can be treated with medication. Because most UTIs are caused by a bacterial infection, they usually can be treated with the use of antibiotics. The choice of antibiotic and length of treatment depend on your symptoms and the type of bacteria causing your infection. HOME CARE INSTRUCTIONS  If you were prescribed antibiotics, take them exactly as your caregiver instructs you. Finish the medication even if you feel better after  you have only taken some of the medication.  Drink enough water and fluids to keep your urine clear or pale yellow.  Avoid caffeine, tea, and carbonated beverages. They tend to irritate your bladder.  Empty your bladder often. Avoid holding urine for long periods of time.  Empty your bladder before and after sexual intercourse.  After a bowel movement, women should cleanse from front to back. Use each tissue only once. SEEK MEDICAL CARE IF:   You have back pain.  You develop a fever.  Your symptoms do not begin to resolve within 3 days. SEEK IMMEDIATE MEDICAL CARE IF:   You have severe back pain or lower abdominal pain.  You develop chills.  You have nausea or vomiting.  You have continued burning or discomfort with urination. MAKE SURE YOU:   Understand these instructions.  Will watch your condition.  Will get help right away if you are not doing well or get worse.   This information is not intended to replace advice given to you by your health care provider. Make sure you discuss any questions you have with your health care provider.   Document Released: 06/18/2005 Document Revised: 05/30/2015 Document Reviewed: 10/17/2011 Elsevier Interactive Patient Education 2016 [employer] Inc[employer] Diarrhea Diarrhea is frequent loose and watery bowel movements. It can cause you to feel weak and dehydrated. Dehydration can cause you to become tired and thirsty, have a dry mouth, and have decreased urination that often is dark yellow. Diarrhea is a sign of another problem, most often an infection that will not last long. In most cases, diarrhea typically  lasts 2-3 days. However, it can last longer if it is a sign of something more serious. It is important to treat your diarrhea as directed by your caregiver to lessen or prevent future episodes of diarrhea. CAUSES  Some common causes include:  Gastrointestinal infections caused by viruses, bacteria, or parasites.  Food poisoning or food  allergies.  Certain medicines, such as antibiotics, chemotherapy, and laxatives.  Artificial sweeteners and fructose.  Digestive disorders. HOME CARE INSTRUCTIONS  Ensure adequate fluid intake (hydration): Have 1 cup (8 oz) of fluid for each diarrhea episode. Avoid fluids that contain simple sugars or sports drinks, fruit juices, whole milk products, and sodas. Your urine should be clear or pale yellow if you are drinking enough fluids. Hydrate with an oral rehydration solution that you can purchase at pharmacies, retail stores, and online. You can prepare an oral rehydration solution at home by mixing the following ingredients together:   - tsp table salt.   tsp baking soda.   tsp salt substitute containing potassium chloride.  1  tablespoons sugar.  1 L (34 oz) of water.  Certain foods and beverages may increase the speed at which food moves through the gastrointestinal (GI) tract. These foods and beverages should be avoided and include:  Caffeinated and alcoholic beverages.  High-fiber foods, such as raw fruits and vegetables, nuts, seeds, and whole grain breads and cereals.  Foods and beverages sweetened with sugar alcohols, such as xylitol, sorbitol, and mannitol.  Some foods may be well tolerated and may help thicken stool including:  Starchy foods, such as rice, toast, pasta, low-sugar cereal, oatmeal, grits, baked potatoes, crackers, and bagels.  Bananas.  Applesauce.  Add probiotic-rich foods to help increase healthy bacteria in the GI tract, such as yogurt and fermented milk products.  Wash your hands well after each diarrhea episode.  Only take over-the-counter or prescription medicines as directed by your caregiver.  Take a warm bath to relieve any burning or pain from frequent diarrhea episodes. SEEK IMMEDIATE MEDICAL CARE IF:   You are unable to keep fluids down.  You have persistent vomiting.  You have blood in your stool, or your stools are black and  tarry.  You do not urinate in 6-8 hours, or there is only a small amount of very dark urine.  You have abdominal pain that increases or localizes.  You have weakness, dizziness, confusion, or light-headedness.  You have a severe headache.  Your diarrhea gets worse or does not get better.  You have a fever or persistent symptoms for more than 2-3 days.  You have a fever and your symptoms suddenly get worse. MAKE SURE YOU:   Understand these instructions.  Will watch your condition.  Will get help right away if you are not doing well or get worse.   This information is not intended to replace advice given to you by your health care provider. Make sure you discuss any questions you have with your health care provider.   Document Released: 08/29/2002 Document Revised: 09/29/2014 Document Reviewed: 05/16/2012 Elsevier Interactive Patient Education [employer]

## 2015-10-21 NOTE — ED Notes (Signed)
Call Irving Burton, pt's niece if discharged, [phone]

## 2015-10-22 ENCOUNTER — Encounter (HOSPITAL_COMMUNITY): Payer: Self-pay

## 2015-10-22 ENCOUNTER — Inpatient Hospital Stay (HOSPITAL_COMMUNITY)
Admission: EM | Admit: 2015-10-22 | Discharge: 2015-10-25 | DRG: 683 | Disposition: A | Discharge status: Home / Self Care | Payer: Medicare Other | Attending: Internal Medicine | Admitting: Internal Medicine

## 2015-10-22 ENCOUNTER — Encounter: Payer: Self-pay | Admitting: Internal Medicine

## 2015-10-22 DIAGNOSIS — Z95810 Presence of automatic (implantable) cardiac defibrillator: Secondary | ICD-10-CM

## 2015-10-22 DIAGNOSIS — F17210 Nicotine dependence, cigarettes, uncomplicated: Secondary | ICD-10-CM | POA: Diagnosis present

## 2015-10-22 DIAGNOSIS — N183 Chronic kidney disease, stage 3 (moderate): Secondary | ICD-10-CM | POA: Diagnosis present

## 2015-10-22 DIAGNOSIS — R42 Dizziness and giddiness: Secondary | ICD-10-CM

## 2015-10-22 DIAGNOSIS — Z8673 Personal history of transient ischemic attack (TIA), and cerebral infarction without residual deficits: Secondary | ICD-10-CM

## 2015-10-22 DIAGNOSIS — I129 Hypertensive chronic kidney disease with stage 1 through stage 4 chronic kidney disease, or unspecified chronic kidney disease: Secondary | ICD-10-CM | POA: Diagnosis present

## 2015-10-22 DIAGNOSIS — F419 Anxiety disorder, unspecified: Secondary | ICD-10-CM | POA: Diagnosis present

## 2015-10-22 DIAGNOSIS — I2510 Atherosclerotic heart disease of native coronary artery without angina pectoris: Secondary | ICD-10-CM | POA: Diagnosis present

## 2015-10-22 DIAGNOSIS — A084 Viral intestinal infection, unspecified: Secondary | ICD-10-CM | POA: Diagnosis present

## 2015-10-22 DIAGNOSIS — E785 Hyperlipidemia, unspecified: Secondary | ICD-10-CM | POA: Diagnosis present

## 2015-10-22 DIAGNOSIS — B9620 Unspecified Escherichia coli [E. coli] as the cause of diseases classified elsewhere: Secondary | ICD-10-CM | POA: Diagnosis present

## 2015-10-22 DIAGNOSIS — F329 Major depressive disorder, single episode, unspecified: Secondary | ICD-10-CM | POA: Diagnosis present

## 2015-10-22 DIAGNOSIS — Z886 Allergy status to analgesic agent status: Secondary | ICD-10-CM

## 2015-10-22 DIAGNOSIS — J329 Chronic sinusitis, unspecified: Secondary | ICD-10-CM | POA: Diagnosis present

## 2015-10-22 DIAGNOSIS — Z23 Encounter for immunization: Secondary | ICD-10-CM

## 2015-10-22 DIAGNOSIS — Z883 Allergy status to other anti-infective agents status: Secondary | ICD-10-CM

## 2015-10-22 DIAGNOSIS — J449 Chronic obstructive pulmonary disease, unspecified: Secondary | ICD-10-CM | POA: Diagnosis present

## 2015-10-22 DIAGNOSIS — E860 Dehydration: Secondary | ICD-10-CM | POA: Diagnosis present

## 2015-10-22 DIAGNOSIS — N179 Acute kidney failure, unspecified: Principal | ICD-10-CM | POA: Diagnosis present

## 2015-10-22 DIAGNOSIS — N390 Urinary tract infection, site not specified: Secondary | ICD-10-CM | POA: Diagnosis present

## 2015-10-22 DIAGNOSIS — I4891 Unspecified atrial fibrillation: Secondary | ICD-10-CM | POA: Diagnosis present

## 2015-10-22 DIAGNOSIS — I4581 Long QT syndrome: Secondary | ICD-10-CM | POA: Diagnosis present

## 2015-10-22 DIAGNOSIS — I480 Paroxysmal atrial fibrillation: Secondary | ICD-10-CM | POA: Diagnosis present

## 2015-10-22 MED ORDER — SODIUM CHLORIDE 0.9 % IV BOLUS (SEPSIS)
1000.0000 mL | Freq: Once | INTRAVENOUS | Status: AC
  Administered 2015-10-23: 1000 mL via INTRAVENOUS

## 2015-10-22 NOTE — ED Notes (Signed)
Per EMS - pt c/o fatigue, nausea x 2 days. Pt reports diarrhea yesterday, none today. Pt seen and dx yesterday w/ UTI but did no [id number], started on  neb. 12-lead unremarkable. Denies CP. Mild shortness of breath. fluid. Last BP 92/69. CBG 108. Hx afib

## 2015-10-22 NOTE — ED Notes (Signed)
 of Fentanyl waste with Robyne Peers

## 2015-10-22 NOTE — ED Provider Notes (Signed)
CSN: [id number]     Arrival date & time 10/22/15  2234 History   First MD Initiated Contact with Patient 10/22/15 2235     Chief Complaint  Patient presents with  . Fatigue  . Nausea    HPI   [name] is a 65 y.o. female with a PMH of HTN, HLD, CAD, atrial fibrillation, COPD, seizures, anxiety, depression, CVA who presents to the ED with generalized weakness and fatigue for the past week. She also reports nausea, vomiting, and diarrhea, now resolved. She denies exacerbating or alleviating factors. She was evaluated in the ED last night for similar symptoms, and returned today due to no improvement. She states she was supposed to start an antibiotic course for her UTI, however was unable to pick up the medication due to cost. She denies fever, chills, chest pain, shortness of breath, abdominal pain, dysuria, urgency, frequency.    Past Medical History  Diagnosis Date  . Coronary artery disease     non obstructive  . Seizures (HCC)   . Anxiety   . Depression   . Migraine   . Fibromuscular dysplasia (HCC)   . Tobacco abuse   . COPD (chronic obstructive pulmonary disease) (HCC)   . Arthritis of knee     Bilateral  . Tobacco abuse   . Migraine headache   . Anxiety disorder   . Depression   . Seizure disorder (HCC)   . Cerebrovascular disease   . CAD (coronary artery disease)   . Automatic implantable cardiac defibrillator in situ     [employer] ICD  . HTN (hypertension)   . HLD (hyperlipidemia)   . Ventricular fibrillation (HCC)   . Long QT syndrome   . Cardiac arrest (HCC)   . Atrial fibrillation (HCC)     a) 2D echo 09/11/12: LVEF 55-60%, mild LVH, mld LA dilatation  . Allergy   . Stroke [facility]     no deficits   Past Surgical History  Procedure Laterality Date  . Pacemaker insertion  05/01/2003    ICD, implantation of a Guidant single chamber defibrillator, Doylene Canning. Ladona Ridgel MD  . Cholecystectomy    . Tubal ligation    . Breast lumpectomy    .  Appendectomy    . Cosmetic surgery     Family History  Problem Relation Age of Onset  . Dementia Mother   . Stroke Mother     Multiple  . Aneurysm Sister     Brain  . Stroke Sister   . Uterine cancer Other     Grandmother  . Colon cancer Sister   . Cancer Sister    Social History  Substance Use Topics  . Smoking status: Current Every Day Smoker -- 0.25 packs/day for 40 years    Types: Cigarettes  . Smokeless tobacco: Never Used     Comment: almost a pack a week  . Alcohol Use: No   OB History    No data available      Review of Systems  Constitutional: Positive for fatigue. Negative for fever and chills.  Respiratory: Negative for shortness of breath.   Cardiovascular: Negative for chest pain.  Gastrointestinal: Positive for nausea, vomiting and diarrhea. Negative for abdominal pain, constipation and blood in stool.  Genitourinary: Negative for dysuria, urgency and frequency.  Neurological: Positive for weakness. Negative for dizziness, syncope, light-headedness, numbness and headaches.  All other systems reviewed and are negative.     Allergies  Azithromycin; Ibuprofen; Codeine; and Demerol  Home Medications   Prior to Admission medications   Medication Sig Start Date End Date Taking? Authorizing Provider  acetaminophen (TYLENOL) 500 MG tablet Take 1,000 mg by mouth every 6 (six) hours as needed for headache (pain).    Yes Historical Provider, MD  albuterol (PROVENTIL HFA;VENTOLIN HFA) 108 (90 BASE) MCG/ACT inhaler Inhale 2 puffs into the lungs every 6 (six) hours as needed for wheezing.   Yes Historical Provider, MD  diazepam (VALIUM) 5 MG tablet Take 1 tablet (5 mg total) by mouth every 12 (twelve) hours as needed for anxiety. Patient taking differently: Take 5 mg by mouth 3 (three) times daily as needed for anxiety.  06/27/14  Yes Iran Ouch, MD  diphenhydrAMINE (BENADRYL) 25 MG tablet Take 50 mg by mouth every 6 (six) hours as needed for allergies or  sleep.    Yes Historical Provider, MD  loperamide (IMODIUM A-D) 2 MG tablet Take 2 mg by mouth 4 (four) times daily as needed for diarrhea or loose stools.   Yes Historical Provider, MD  metoprolol tartrate (LOPRESSOR) 25 MG tablet Take 1 tablet (25 mg total) by mouth 2 (two) times daily. Patient taking differently: Take 25 mg by mouth See admin instructions. Take 1 tablet (25 mg) by mouth twice daily, may take an additional tablet mid-day as needed for afib 06/06/15  Yes Iran Ouch, MD  mirtazapine (REMERON) 15 MG tablet Take 15-30 mg by mouth at bedtime as needed (sleep).  10/20/14  Yes Historical Provider, MD  oxymetazoline (AFRIN) 0.05 % nasal spray Place 2 sprays into both nostrils 2 (two) times daily as needed for congestion.   Yes Historical Provider, MD  Rivaroxaban (XARELTO) 15 MG TABS tablet Take 1 tablet (15 mg total) by mouth at bedtime. 07/25/15  Yes Iran Ouch, MD  sertraline (ZOLOFT) 100 MG tablet Take 200 mg by mouth at bedtime.    Yes Historical Provider, MD  simvastatin (ZOCOR) 20 MG tablet Take 1 tablet (20 mg total) by mouth at bedtime. 06/06/15  Yes Iran Ouch, MD  cephALEXin (KEFLEX) 500 MG capsule Take 1 capsule (500 mg total) by mouth 2 (two) times daily. 10/21/15 10/28/15  Silas Flood, MD  HYDROcodone-acetaminophen (NORCO) 5-325 MG tablet Take 1 tablet by mouth every 6 (six) hours as needed for severe pain. Patient not taking: Reported on 10/21/2015 08/01/15   Pricilla Loveless, MD  potassium chloride SA (K-DUR,KLOR-CON) 20 MEQ tablet Take 1 tablet (20 mEq total) by mouth daily. Patient not taking: Reported on 08/01/2015 05/31/14   Pricilla Loveless, MD  simvastatin (ZOCOR) 20 MG tablet TAKE ONE TABLET BY MOUTH AT BEDTIME Patient not taking: Reported on 10/21/2015 09/25/15   Iran Ouch, MD    BP 105/72 mmHg  Pulse 112  Resp 21  SpO2 100% Physical Exam  Constitutional: She is oriented to person, place, and time. She appears well-developed and well-nourished. No  distress.  HENT:  Head: Normocephalic and atraumatic.  Right Ear: External ear normal.  Left Ear: External ear normal.  Nose: Nose normal.  Mouth/Throat: Uvula is midline, oropharynx is clear and moist and mucous membranes are normal.  Eyes: Conjunctivae, EOM and lids are normal. Pupils are equal, round, and reactive to light. Right eye exhibits no discharge. Left eye exhibits no discharge. No scleral icterus.  Neck: Normal range of motion. Neck supple.  Cardiovascular: Normal heart sounds, intact distal pulses and normal pulses.  An irregularly irregular rhythm present. Tachycardia present.   Pulmonary/Chest: Effort normal and breath  sounds normal. No respiratory distress. She has no wheezes. She has no rales.  Abdominal: Soft. Normal appearance and bowel sounds are normal. She exhibits no distension and no mass. There is no tenderness. There is no rigidity, no rebound and no guarding.  Musculoskeletal: Normal range of motion. She exhibits no edema or tenderness.  Neurological: She is alert and oriented to person, place, and time. She has normal strength. No sensory deficit.  Skin: Skin is warm, dry and intact. No rash noted. She is not diaphoretic. No erythema. No pallor.  Psychiatric: She has a normal mood and affect. Her speech is normal and behavior is normal.  Nursing note and vitals reviewed.   ED Course  Procedures (including critical care time)  Labs Review Labs Reviewed  BASIC METABOLIC PANEL - Abnormal; Notable for the following:    Glucose, Bld 107 (*)    Creatinine, Ser 1.41 (*)    Calcium 8.5 (*)    GFR calc non Af Amer 38 (*)    GFR calc Af Amer 44 (*)    All other components within normal limits  CBC WITH DIFFERENTIAL/PLATELET  URINALYSIS, ROUTINE W REFLEX MICROSCOPIC (NOT AT [facility])  I-STAT TROPOININ, ED  I-STAT CG4 LACTIC ACID, ED    Imaging Review No results found.   I have personally reviewed and evaluated these images and lab results as part of my medical  decision-making.   EKG Interpretation Date/Time:  Monday October 22 2015 23:54:34 EST Ventricular Rate:  117 PR Interval:    QRS Duration: 91 QT Interval:  300 QTC Calculation: 418 R Axis:   35 Text Interpretation:  Atrial fibrillation/Flutter Low voltage, precordial  leads Borderline repolarization abnormality previously in sinus rhythm  Confirmed by HORTON  MD, COURTNEY ([id number]) on 10/23/2015 12:06:54 AM    EKG Interpretation  Date/Time:  Tuesday October 23 2015 01:13:13 EST Ventricular Rate:  78 PR Interval:  235 QRS Duration: 95 QT Interval:  410 QTC Calculation: 467 R Axis:   33 Text Interpretation:  Sinus rhythm Prolonged PR interval Low voltage, precordial leads Nonspecific T abnormalities, diffuse leads NOw in sinus rhythm Confirmed by HORTON  MD, COURTNEY ([id number]) on 10/23/2015 1:17:36 AM        MDM   Final diagnoses:  UTI (lower urinary tract infection)  Atrial fibrillation with RVR (HCC)    65 year old female presents with generalized weakness and fatigue. Patient evaluated yesterday in the ED for N/V, diarrhea, and generalized weakness. She was noted to have a UTI and was discharged in stable condition with keflex.  Patient is afebrile. Tachycardic. Heart irregularly irregular. Lungs clear bilaterally. Abdomen soft, non-tender, non-distended. No rebound, guarding, or masses. No lower extremity edema.   EKG atrial fibrillation with RVR. Troponin negative. Given 1L bolus NS x 2. Labs pending.  CBC negative for leukocytosis or anemia. BMP remarkable for creatinine 1.41. Lactic acid within normal limits. Patient converted to sinus rhythm spontaneously prior to receiving metoprolol, HR now 78. Given dose of rocephin for UTI (UA yesterday with positive nitrites and leukocytes).  Hospitalist consulted for admission. Spoke with [facility], patient to be admitted for further evaluation and management. Patient discussed with Dr. Wilkie Aye.   BP 92/74 mmHg  Pulse 79  Resp 26   SpO2 96%     Mady Gemma, PA-C 10/23/15 2956  Shon Baton, MD 10/23/15 [phone]

## 2015-10-23 ENCOUNTER — Inpatient Hospital Stay (HOSPITAL_COMMUNITY): Payer: Medicare Other

## 2015-10-23 ENCOUNTER — Encounter (HOSPITAL_COMMUNITY): Payer: Self-pay | Admitting: Internal Medicine

## 2015-10-23 DIAGNOSIS — N390 Urinary tract infection, site not specified: Secondary | ICD-10-CM | POA: Diagnosis not present

## 2015-10-23 DIAGNOSIS — F17210 Nicotine dependence, cigarettes, uncomplicated: Secondary | ICD-10-CM | POA: Diagnosis present

## 2015-10-23 DIAGNOSIS — F419 Anxiety disorder, unspecified: Secondary | ICD-10-CM | POA: Diagnosis present

## 2015-10-23 DIAGNOSIS — R05 Cough: Secondary | ICD-10-CM | POA: Diagnosis not present

## 2015-10-23 DIAGNOSIS — Z883 Allergy status to other anti-infective agents status: Secondary | ICD-10-CM | POA: Diagnosis not present

## 2015-10-23 DIAGNOSIS — I2510 Atherosclerotic heart disease of native coronary artery without angina pectoris: Secondary | ICD-10-CM | POA: Diagnosis present

## 2015-10-23 DIAGNOSIS — J449 Chronic obstructive pulmonary disease, unspecified: Secondary | ICD-10-CM | POA: Diagnosis present

## 2015-10-23 DIAGNOSIS — I480 Paroxysmal atrial fibrillation: Secondary | ICD-10-CM | POA: Diagnosis not present

## 2015-10-23 DIAGNOSIS — I129 Hypertensive chronic kidney disease with stage 1 through stage 4 chronic kidney disease, or unspecified chronic kidney disease: Secondary | ICD-10-CM | POA: Diagnosis present

## 2015-10-23 DIAGNOSIS — B9620 Unspecified Escherichia coli [E. coli] as the cause of diseases classified elsewhere: Secondary | ICD-10-CM | POA: Diagnosis present

## 2015-10-23 DIAGNOSIS — Z8673 Personal history of transient ischemic attack (TIA), and cerebral infarction without residual deficits: Secondary | ICD-10-CM | POA: Diagnosis not present

## 2015-10-23 DIAGNOSIS — R42 Dizziness and giddiness: Secondary | ICD-10-CM | POA: Diagnosis not present

## 2015-10-23 DIAGNOSIS — Z23 Encounter for immunization: Secondary | ICD-10-CM | POA: Diagnosis not present

## 2015-10-23 DIAGNOSIS — E860 Dehydration: Secondary | ICD-10-CM | POA: Diagnosis present

## 2015-10-23 DIAGNOSIS — R509 Fever, unspecified: Secondary | ICD-10-CM | POA: Diagnosis not present

## 2015-10-23 DIAGNOSIS — Z95810 Presence of automatic (implantable) cardiac defibrillator: Secondary | ICD-10-CM | POA: Diagnosis not present

## 2015-10-23 DIAGNOSIS — F329 Major depressive disorder, single episode, unspecified: Secondary | ICD-10-CM | POA: Diagnosis present

## 2015-10-23 DIAGNOSIS — I4581 Long QT syndrome: Secondary | ICD-10-CM | POA: Diagnosis not present

## 2015-10-23 DIAGNOSIS — I4891 Unspecified atrial fibrillation: Secondary | ICD-10-CM | POA: Diagnosis not present

## 2015-10-23 DIAGNOSIS — E785 Hyperlipidemia, unspecified: Secondary | ICD-10-CM | POA: Diagnosis present

## 2015-10-23 DIAGNOSIS — Z886 Allergy status to analgesic agent status: Secondary | ICD-10-CM | POA: Diagnosis not present

## 2015-10-23 DIAGNOSIS — N179 Acute kidney failure, unspecified: Secondary | ICD-10-CM | POA: Diagnosis present

## 2015-10-23 DIAGNOSIS — J329 Chronic sinusitis, unspecified: Secondary | ICD-10-CM | POA: Diagnosis present

## 2015-10-23 DIAGNOSIS — R0602 Shortness of breath: Secondary | ICD-10-CM | POA: Diagnosis not present

## 2015-10-23 DIAGNOSIS — A084 Viral intestinal infection, unspecified: Secondary | ICD-10-CM | POA: Diagnosis present

## 2015-10-23 DIAGNOSIS — N183 Chronic kidney disease, stage 3 (moderate): Secondary | ICD-10-CM | POA: Diagnosis present

## 2015-10-23 LAB — URINALYSIS, ROUTINE W REFLEX MICROSCOPIC
Bilirubin Urine: NEGATIVE
Glucose, UA: NEGATIVE mg/dL
HGB URINE DIPSTICK: NEGATIVE
Ketones, ur: NEGATIVE mg/dL
Nitrite: NEGATIVE
PROTEIN: NEGATIVE mg/dL
SPECIFIC GRAVITY, URINE: 1.02 (ref 1.005–1.030)
pH: 5.5 (ref 5.0–8.0)

## 2015-10-23 LAB — BASIC METABOLIC PANEL
ANION GAP: 12 (ref 5–15)
BUN: 15 mg/dL (ref 6–20)
CHLORIDE: 105 mmol/L (ref 101–111)
CO2: 24 mmol/L (ref 22–32)
Calcium: 8.5 mg/dL — ABNORMAL LOW (ref 8.9–10.3)
Creatinine, Ser: 1.41 mg/dL — ABNORMAL HIGH (ref 0.44–1.00)
GFR calc Af Amer: 44 mL/min — ABNORMAL LOW (ref >60)
GFR, EST NON AFRICAN AMERICAN: 38 mL/min — AB (ref >60)
GLUCOSE: 107 mg/dL — AB (ref 65–99)
POTASSIUM: 3.8 mmol/L (ref 3.5–5.1)
SODIUM: 141 mmol/L (ref 135–145)

## 2015-10-23 LAB — I-STAT TROPONIN, ED: TROPONIN I, POC: 0 ng/mL (ref 0.00–0.08)

## 2015-10-23 LAB — COMPREHENSIVE METABOLIC PANEL
ALK PHOS: 58 U/L (ref 38–126)
ALT: 12 U/L — AB (ref 14–54)
AST: 17 U/L (ref 15–41)
Albumin: 3 g/dL — ABNORMAL LOW (ref 3.5–5.0)
Anion gap: 9 (ref 5–15)
BUN: 11 mg/dL (ref 6–20)
CALCIUM: 8.1 mg/dL — AB (ref 8.9–10.3)
CO2: 22 mmol/L (ref 22–32)
CREATININE: 1.32 mg/dL — AB (ref 0.44–1.00)
Chloride: 109 mmol/L (ref 101–111)
GFR calc non Af Amer: 41 mL/min — ABNORMAL LOW (ref >60)
GFR, EST AFRICAN AMERICAN: 48 mL/min — AB (ref >60)
Glucose, Bld: 88 mg/dL (ref 65–99)
Potassium: 4 mmol/L (ref 3.5–5.1)
SODIUM: 140 mmol/L (ref 135–145)
Total Bilirubin: 0.2 mg/dL — ABNORMAL LOW (ref 0.3–1.2)
Total Protein: 5.4 g/dL — ABNORMAL LOW (ref 6.5–8.1)

## 2015-10-23 LAB — CBC WITH DIFFERENTIAL/PLATELET
BASOS ABS: 0.1 10*3/uL (ref 0.0–0.1)
BASOS PCT: 1 %
Basophils Absolute: 0.1 10*3/uL (ref 0.0–0.1)
Basophils Relative: 1 %
EOS ABS: 0.1 10*3/uL (ref 0.0–0.7)
EOS PCT: 2 %
EOS PCT: 3 %
Eosinophils Absolute: 0.2 10*3/uL (ref 0.0–0.7)
HCT: 37.7 % (ref 36.0–46.0)
HCT: 34.2 % — ABNORMAL LOW (ref 36.0–46.0)
HEMOGLOBIN: 11.4 g/dL — AB (ref 12.0–15.0)
HEMOGLOBIN: 12.4 g/dL (ref 12.0–15.0)
LYMPHS ABS: 2.2 10*3/uL (ref 0.7–4.0)
LYMPHS ABS: 2.7 10*3/uL (ref 0.7–4.0)
LYMPHS PCT: 28 %
Lymphocytes Relative: 34 %
MCH: 32 pg (ref 26.0–34.0)
MCH: 32.5 pg (ref 26.0–34.0)
MCHC: 32.9 g/dL (ref 30.0–36.0)
MCHC: 33.3 g/dL (ref 30.0–36.0)
MCV: 97.4 fL (ref 78.0–100.0)
MCV: 97.4 fL (ref 78.0–100.0)
MONO ABS: 0.6 10*3/uL (ref 0.1–1.0)
MONOS PCT: 8 %
Monocytes Absolute: 0.8 10*3/uL (ref 0.1–1.0)
Monocytes Relative: 10 %
NEUTROS PCT: 55 %
NEUTROS PCT: 58 %
Neutro Abs: 4.6 10*3/uL (ref 1.7–7.7)
Neutro Abs: 4.4 10*3/uL (ref 1.7–7.7)
PLATELETS: 199 10*3/uL (ref 150–400)
PLATELETS: 201 10*3/uL (ref 150–400)
RBC: 3.51 MIL/uL — ABNORMAL LOW (ref 3.87–5.11)
RBC: 3.87 MIL/uL (ref 3.87–5.11)
RDW: 12.9 % (ref 11.5–15.5)
RDW: 13.1 % (ref 11.5–15.5)
WBC: 7.8 10*3/uL (ref 4.0–10.5)
WBC: 7.9 10*3/uL (ref 4.0–10.5)

## 2015-10-23 LAB — URINE MICROSCOPIC-ADD ON

## 2015-10-23 LAB — MRSA PCR SCREENING: MRSA by PCR: NEGATIVE

## 2015-10-23 LAB — URINE CULTURE

## 2015-10-23 LAB — I-STAT CG4 LACTIC ACID, ED: LACTIC ACID, VENOUS: 1.83 mmol/L (ref 0.5–2.0)

## 2015-10-23 MED ORDER — DIPHENHYDRAMINE HCL 25 MG PO CAPS
50.0000 mg | ORAL_CAPSULE | Freq: Four times a day (QID) | ORAL | Status: DC | PRN
  Administered 2015-10-24 – 2015-10-25 (×3): 50 mg via ORAL
  Filled 2015-10-23 (×5): qty 2

## 2015-10-23 MED ORDER — DEXTROSE 5 % IV SOLN
1.0000 g | INTRAVENOUS | Status: DC
  Administered 2015-10-24: 1 g via INTRAVENOUS
  Filled 2015-10-23 (×2): qty 10

## 2015-10-23 MED ORDER — PNEUMOCOCCAL VAC POLYVALENT 25 MCG/0.5ML IJ INJ
0.5000 mL | INJECTION | INTRAMUSCULAR | Status: AC
  Administered 2015-10-24: 0.5 mL via INTRAMUSCULAR
  Filled 2015-10-23 (×2): qty 0.5

## 2015-10-23 MED ORDER — INFLUENZA VAC SPLIT QUAD 0.5 ML IM SUSY
0.5000 mL | PREFILLED_SYRINGE | INTRAMUSCULAR | Status: AC
  Administered 2015-10-24: 0.5 mL via INTRAMUSCULAR
  Filled 2015-10-23: qty 0.5

## 2015-10-23 MED ORDER — METOPROLOL TARTRATE 1 MG/ML IV SOLN
5.0000 mg | Freq: Once | INTRAVENOUS | Status: DC
  Filled 2015-10-23: qty 5

## 2015-10-23 MED ORDER — ONDANSETRON HCL 4 MG/2ML IJ SOLN
4.0000 mg | Freq: Four times a day (QID) | INTRAMUSCULAR | Status: DC | PRN
  Administered 2015-10-24: 4 mg via INTRAVENOUS
  Filled 2015-10-23: qty 2

## 2015-10-23 MED ORDER — MIRTAZAPINE 15 MG PO TABS: 15.0000 mg | ORAL_TABLET | Freq: Every evening | ORAL | Status: DC | PRN

## 2015-10-23 MED ORDER — SODIUM CHLORIDE 0.9 % IV BOLUS (SEPSIS)
1000.0000 mL | Freq: Once | INTRAVENOUS | Status: AC
  Administered 2015-10-23: 1000 mL via INTRAVENOUS

## 2015-10-23 MED ORDER — RIVAROXABAN 15 MG PO TABS
15.0000 mg | ORAL_TABLET | ORAL | Status: AC
  Administered 2015-10-23: 15 mg via ORAL
  Filled 2015-10-23: qty 1

## 2015-10-23 MED ORDER — SODIUM CHLORIDE 0.9 % IV SOLN
INTRAVENOUS | Status: AC
  Administered 2015-10-23: 12:00:00 via INTRAVENOUS

## 2015-10-23 MED ORDER — METOPROLOL TARTRATE 25 MG PO TABS
25.0000 mg | ORAL_TABLET | Freq: Two times a day (BID) | ORAL | Status: DC
  Administered 2015-10-23 – 2015-10-25 (×4): 25 mg via ORAL
  Filled 2015-10-23 (×5): qty 1

## 2015-10-23 MED ORDER — KETOROLAC TROMETHAMINE 30 MG/ML IJ SOLN
30.0000 mg | Freq: Once | INTRAMUSCULAR | Status: AC
  Administered 2015-10-23: 30 mg via INTRAVENOUS
  Filled 2015-10-23: qty 1

## 2015-10-23 MED ORDER — ONDANSETRON HCL 4 MG PO TABS
4.0000 mg | ORAL_TABLET | Freq: Four times a day (QID) | ORAL | Status: DC | PRN
  Administered 2015-10-25: 4 mg via ORAL
  Filled 2015-10-23: qty 1

## 2015-10-23 MED ORDER — ALBUTEROL SULFATE HFA 108 (90 BASE) MCG/ACT IN AERS: 2.0000 | INHALATION_SPRAY | Freq: Four times a day (QID) | RESPIRATORY_TRACT | Status: DC | PRN

## 2015-10-23 MED ORDER — OXYCODONE-ACETAMINOPHEN 5-325 MG PO TABS
1.0000 | ORAL_TABLET | ORAL | Status: DC | PRN
  Administered 2015-10-23 – 2015-10-24 (×3): 1 via ORAL
  Filled 2015-10-23 (×3): qty 1

## 2015-10-23 MED ORDER — SERTRALINE HCL 100 MG PO TABS
200.0000 mg | ORAL_TABLET | Freq: Every day | ORAL | Status: DC
  Administered 2015-10-23 – 2015-10-24 (×2): 200 mg via ORAL
  Filled 2015-10-23 (×2): qty 2

## 2015-10-23 MED ORDER — SODIUM CHLORIDE 0.9 % IV SOLN
INTRAVENOUS | Status: DC
  Administered 2015-10-23: 05:00:00 via INTRAVENOUS

## 2015-10-23 MED ORDER — POTASSIUM CHLORIDE CRYS ER 20 MEQ PO TBCR
20.0000 meq | EXTENDED_RELEASE_TABLET | Freq: Every day | ORAL | Status: DC
Start: 2015-10-23 — End: 2015-10-25
  Administered 2015-10-23 – 2015-10-25 (×3): 20 meq via ORAL
  Filled 2015-10-23 (×3): qty 1

## 2015-10-23 MED ORDER — METOCLOPRAMIDE HCL 5 MG/ML IJ SOLN
10.0000 mg | Freq: Once | INTRAMUSCULAR | Status: AC
  Administered 2015-10-23: 10 mg via INTRAVENOUS
  Filled 2015-10-23: qty 2

## 2015-10-23 MED ORDER — DIAZEPAM 5 MG PO TABS
5.0000 mg | ORAL_TABLET | Freq: Three times a day (TID) | ORAL | Status: DC | PRN
  Administered 2015-10-23 – 2015-10-25 (×5): 5 mg via ORAL
  Filled 2015-10-23 (×5): qty 1

## 2015-10-23 MED ORDER — ACETAMINOPHEN 650 MG RE SUPP: 650.0000 mg | Freq: Four times a day (QID) | RECTAL | Status: DC | PRN

## 2015-10-23 MED ORDER — ACETAMINOPHEN 325 MG PO TABS
650.0000 mg | ORAL_TABLET | Freq: Four times a day (QID) | ORAL | Status: DC | PRN
  Administered 2015-10-23 – 2015-10-25 (×3): 650 mg via ORAL
  Filled 2015-10-23 (×4): qty 2

## 2015-10-23 MED ORDER — SIMVASTATIN 20 MG PO TABS
20.0000 mg | ORAL_TABLET | Freq: Every day | ORAL | Status: DC
  Administered 2015-10-23 – 2015-10-24 (×2): 20 mg via ORAL
  Filled 2015-10-23 (×2): qty 1

## 2015-10-23 MED ORDER — DEXTROSE 5 % IV SOLN
1.0000 g | Freq: Once | INTRAVENOUS | Status: AC
  Administered 2015-10-23: 1 g via INTRAVENOUS
  Filled 2015-10-23: qty 10

## 2015-10-23 MED ORDER — DIPHENHYDRAMINE HCL 50 MG/ML IJ SOLN
25.0000 mg | Freq: Once | INTRAMUSCULAR | Status: AC
  Administered 2015-10-23: 25 mg via INTRAVENOUS
  Filled 2015-10-23: qty 1

## 2015-10-23 MED ORDER — OXYCODONE HCL 5 MG PO TABS
5.0000 mg | ORAL_TABLET | Freq: Four times a day (QID) | ORAL | Status: DC | PRN
  Administered 2015-10-23: 5 mg via ORAL
  Filled 2015-10-23: qty 1

## 2015-10-23 MED ORDER — RIVAROXABAN 15 MG PO TABS
15.0000 mg | ORAL_TABLET | Freq: Every day | ORAL | Status: DC
  Administered 2015-10-23 – 2015-10-24 (×2): 15 mg via ORAL
  Filled 2015-10-23 (×2): qty 1

## 2015-10-23 MED ORDER — SALINE SPRAY 0.65 % NA SOLN
2.0000 | NASAL | Status: DC | PRN
  Filled 2015-10-23: qty 44

## 2015-10-23 NOTE — Progress Notes (Signed)
Pharmacy Antibiotic Note  [name] is a 65 y.o. female admitted on 10/22/2015 with UTI.  Pharmacy has been consulted for Rocephin dosing.  Plan: Will start Rocephin 1g IV Q24H and monitor CBC and Cx.  Height:  (162.6 cm) Weight: 171 lb 11.8 oz (77.9 kg) IBW/kg (Calculated) : 54.7  Temp (24hrs), Avg:98.2 F (36.8 C), Min:98.2 F (36.8 C), Max:98.2 F (36.8 C)   Recent Labs Lab 10/21/15 1542 10/21/15 1543 10/21/15 1754 10/23/15 0051 10/23/15 0118  WBC 6.6  --   --  7.8  --   CREATININE 1.67*  --   --  1.41*  --   LATICACIDVEN  --  1.38 0.75  --  1.83    Estimated Creatinine Clearance: 39.7 mL/min (by C-G formula based on Cr of 1.41).    Allergies  Allergen Reactions  . Azithromycin Other (See Comments)    Hypotension   . Ibuprofen Other (See Comments)    Hypotension. Takes ASA without problem   . Codeine Rash  . Demerol [Meperidine] Rash     Thank you for allowing pharmacy to be a part of this patient's care.  Vernard Gambles, PharmD, BCPS  10/23/2015 4:54 AM

## 2015-10-23 NOTE — Consult Note (Signed)
CH provided Advanced Directive literature for pt. To complete after t/x to 5W. Contact CH when completed.

## 2015-10-23 NOTE — Progress Notes (Signed)
Patient to transfer to 5W30 report given to receiving nurse, all questions answered at this time.  Pt. VSS with no s/s of distress noted.  Pt. Stable at transfer.

## 2015-10-23 NOTE — Progress Notes (Signed)
Patient seen and examined  65 y.o. female history of paroxysmal atrial fibrillation, COPD, long QT syndrome status post ICD placement presents to the ER because of weakness. Patient had come to the ER today to because of nausea vomiting and diarrhea. At the time patient was given IV fluids and also was found to have UTI for which patient was prescribed antibiotics. Patient states since going home patient has been having persistent weakness and feels weak when patient stands. Patient eventually called the EMS and patient was found to be in A. fib with RVR. Patient's blood pressure was in the low normal. In the ER patient was given fluid bolus and also metoprolol following which patient's heart rate improved. Repeat UA on 1/31 was negative  No imaging studies were obtained in the last several days Patient was admitted for atrial fibrillation with rapid ventricular response, heart rate controlled in the 90s this morning, on metoprolol, xarelto,, hemodynamically stable  Plan Continue with IV fluid hydration Continue Rocephin for UTI, follow urine culture Follow stool studies and culture, continue Lopressor for atrial fibrillation Acute kidney injury improving, creatinine has decreased to 1.6> 1.32 Cardiac enzymes negative, lactate normal, transfer to telemetry

## 2015-10-23 NOTE — ED Notes (Signed)
Held Lopressor due to blood pressure (95/66)  and heart rate (105), provider [place] notified and agrees.

## 2015-10-23 NOTE — H&P (Signed)
[facility] History and Physical  [name]:[id number] DOB: 1951-06-26 DOA: 10/22/2015  Referring physician: Ms.Westfall. PCP: Abbe Amsterdam, MD  Specialists: Dr.Arida. Cardiology.  Chief Complaint: Weakness.  HPI: [name] is a 65 y.o. female history of paroxysmal atrial fibrillation, COPD, long QT syndrome status post ICD placement presents to the ER because of weakness. Patient had come to the ER today to because of nausea vomiting and diarrhea. At the time patient was given IV fluids and also was found to have UTI for which patient was prescribed antibiotics. Patient states since going home patient has been having persistent weakness and feels weak when patient stands. Patient eventually called the EMS and patient was found to be in A. fib with RVR. Patient's blood pressure was in the low normal. In the ER patient was given fluid bolus and also metoprolol following which patient's heart rate improved. Patient has been admitted for further observation. In addition patient also has sinusitis. Denies any chest pain or shortness of breath.   Review of Systems: As presented in the history of presenting illness, rest negative.  Past Medical History  Diagnosis Date  . Coronary artery disease     non obstructive  . Seizures (HCC)   . Anxiety   . Depression   . Migraine   . Fibromuscular dysplasia (HCC)   . Tobacco abuse   . COPD (chronic obstructive pulmonary disease) (HCC)   . Arthritis of knee     Bilateral  . Tobacco abuse   . Migraine headache   . Anxiety disorder   . Depression   . Seizure disorder (HCC)   . Cerebrovascular disease   . CAD (coronary artery disease)   . Automatic implantable cardiac defibrillator in situ     [employer] ICD  . HTN (hypertension)   . HLD (hyperlipidemia)   . Ventricular fibrillation (HCC)   . Long QT syndrome   . Cardiac arrest (HCC)   . Atrial fibrillation (HCC)     a) 2D echo 09/11/12: LVEF 55-60%, mild  LVH, mld LA dilatation  . Allergy   . Stroke [facility])     no deficits   Past Surgical History  Procedure Laterality Date  . Pacemaker insertion  05/01/2003    ICD, implantation of a Guidant single chamber defibrillator, Doylene Canning. Ladona Ridgel MD  . Cholecystectomy    . Tubal ligation    . Breast lumpectomy    . Appendectomy    . Cosmetic surgery     Social History:  reports that she has been smoking Cigarettes.  She has a 10 pack-year smoking history. She has never used smokeless tobacco. She reports that she does not drink alcohol or use illicit drugs. Where does patient live home. Can patient participate in ADLs? Yes.  Allergies  Allergen Reactions  . Azithromycin Other (See Comments)    Hypotension   . Ibuprofen Other (See Comments)    Hypotension. Takes ASA without problem   . Codeine Rash  . Demerol [Meperidine] Rash    Family History:  Family History  Problem Relation Age of Onset  . Dementia Mother   . Stroke Mother     Multiple  . Aneurysm Sister     Brain  . Stroke Sister   . Uterine cancer Other     Grandmother  . Colon cancer Sister   . Cancer Sister       Prior to Admission medications   Medication Sig Start Date End Date Taking? Authorizing Provider  acetaminophen (  TYLENOL) 500 MG tablet Take 1,000 mg by mouth every 6 (six) hours as needed for headache (pain).    Yes Historical Provider, MD  albuterol (PROVENTIL HFA;VENTOLIN HFA) 108 (90 BASE) MCG/ACT inhaler Inhale 2 puffs into the lungs every 6 (six) hours as needed for wheezing.   Yes Historical Provider, MD  diazepam (VALIUM) 5 MG tablet Take 1 tablet (5 mg total) by mouth every 12 (twelve) hours as needed for anxiety. Patient taking differently: Take 5 mg by mouth 3 (three) times daily as needed for anxiety.  06/27/14  Yes Iran Ouch, MD  diphenhydrAMINE (BENADRYL) 25 MG tablet Take 50 mg by mouth every 6 (six) hours as needed for allergies or sleep.    Yes Historical Provider, MD  loperamide (IMODIUM  A-D) 2 MG tablet Take 2 mg by mouth 4 (four) times daily as needed for diarrhea or loose stools.   Yes Historical Provider, MD  metoprolol tartrate (LOPRESSOR) 25 MG tablet Take 1 tablet (25 mg total) by mouth 2 (two) times daily. Patient taking differently: Take 25 mg by mouth See admin instructions. Take 1 tablet (25 mg) by mouth twice daily, may take an additional tablet mid-day as needed for afib 06/06/15  Yes Iran Ouch, MD  mirtazapine (REMERON) 15 MG tablet Take 15-30 mg by mouth at bedtime as needed (sleep).  10/20/14  Yes Historical Provider, MD  oxymetazoline (AFRIN) 0.05 % nasal spray Place 2 sprays into both nostrils 2 (two) times daily as needed for congestion.   Yes Historical Provider, MD  Rivaroxaban (XARELTO) 15 MG TABS tablet Take 1 tablet (15 mg total) by mouth at bedtime. 07/25/15  Yes Iran Ouch, MD  sertraline (ZOLOFT) 100 MG tablet Take 200 mg by mouth at bedtime.    Yes Historical Provider, MD  simvastatin (ZOCOR) 20 MG tablet Take 1 tablet (20 mg total) by mouth at bedtime. 06/06/15  Yes Iran Ouch, MD  cephALEXin (KEFLEX) 500 MG capsule Take 1 capsule (500 mg total) by mouth 2 (two) times daily. 10/21/15 10/28/15  Silas Flood, MD  HYDROcodone-acetaminophen (NORCO) 5-325 MG tablet Take 1 tablet by mouth every 6 (six) hours as needed for severe pain. Patient not taking: Reported on 10/21/2015 08/01/15   Pricilla Loveless, MD  potassium chloride SA (K-DUR,KLOR-CON) 20 MEQ tablet Take 1 tablet (20 mEq total) by mouth daily. Patient not taking: Reported on 08/01/2015 05/31/14   Pricilla Loveless, MD  simvastatin (ZOCOR) 20 MG tablet TAKE ONE TABLET BY MOUTH AT BEDTIME Patient not taking: Reported on 10/21/2015 09/25/15   Iran Ouch, MD    Physical Exam: Filed Vitals:   10/23/15 0054 10/23/15 0130 10/23/15 0145 10/23/15 0247  BP: 108/76  Pulse: 89 79 79 77  Temp:    98.2 F (36.8 C)  TempSrc:    Oral  Resp: Height:     (1.626 m)   Weight:    77.9 kg (171 lb 11.8 oz)  SpO2: 96% 96% 96% 98%     General:  Moderately built and nourished.  Eyes: Anicteric no pallor.  ENT: No discharge from the ears eyes nose or mouth.  Neck: No mass felt. No JVD appreciated.  Cardiovascular: S1-S2 heard.  Respiratory: No rhonchi or crepitations.  Abdomen: Soft nontender bowel sounds present. No guarding or rigidity.  Skin: No rash.  Musculoskeletal: No edema.  Psychiatric: Appears normal.  Neurologic: Alert awake oriented to time place and person. Moves all extremities.  Labs on Admission:  Basic Metabolic Panel:  Recent Labs Lab 10/21/15 1542 10/23/15 0051  NA 139 141  K 4.1 3.8  CL 106 105  CO2 22 24  GLUCOSE 89 107*  BUN 25* 15  CREATININE 1.67* 1.41*  CALCIUM 9.3 8.5*   Liver Function Tests:  Recent Labs Lab 10/21/15 1542  AST 21  ALT 16  ALKPHOS 66  BILITOT 0.3  PROT 6.3*  ALBUMIN 3.4*   No results for input(s): LIPASE, AMYLASE in the last 168 hours. No results for input(s): AMMONIA in the last 168 hours. CBC:  Recent Labs Lab 10/21/15 1542 10/23/15 0051  WBC 6.6 7.8  NEUTROABS  --  4.6  HGB 14.0 12.4  HCT 42.6 37.7  MCV 96.4 97.4  PLT 240 201   Cardiac Enzymes: No results for input(s): CKTOTAL, CKMB, CKMBINDEX, TROPONINI in the last 168 hours.  BNP (last 3 results) No results for input(s): BNP in the last 8760 hours.  ProBNP (last 3 results) No results for input(s): PROBNP in the last 8760 hours.  CBG: No results for input(s): GLUCAP in the last 168 hours.  Radiological Exams on Admission: No results found.  EKG: Independently reviewed. A. fib with RVR.  Assessment/Plan Principal Problem:   Dehydration Active Problems:   Long QT syndrome   Atrial fibrillation with RVR (HCC)   UTI (lower urinary tract infection)   1. Dehydration - probably from nausea vomiting and diarrhea. Patient's nausea vomiting diarrhea has resolved and has not had any further episodes last  24 hours. Patient did receive fluid bolus in the ER and continue with normal saline infusion at 75 mL per hour. Check orthostatics in a.m. 2. A. fib with RVR - probably precipitated by dehydration and UTI and vomiting and diarrhea. Improved after fluid bolus and IV metoprolol. Continue Coreg. Patient is on xarelto.  3. Long QT syndrome status post ICD placement. 4. UTI - patient is on ceftriaxone. Follow urine cultures. 5. Sinusitis - patient on empiric antibiotics.   DVT Prophylaxis xarelto.  Code Status: Full code.  Family Communication: Discussed with patient.  Disposition Plan: Admit to inpatient.    Chiquita Heckert N. [facility] Pager [phone].  If 7PM-7AM, please contact night-coverage [email or web address] Password [email or web address] 10/23/2015, 4:53 AM

## 2015-10-23 NOTE — Progress Notes (Signed)
Utilization review completed. Dat Derksen, RN, BSN. 

## 2015-10-23 NOTE — Discharge Instructions (Signed)

## 2015-10-23 NOTE — Care Management Note (Signed)
Case Management Note  Patient Details  Name: [name] MRN: [id number] Date of Birth: 12/11/1950  Subjective/Objective:    Date: 10/23/15 Spoke with patient at the bedside.  Introduced self as Sports coach and explained role in discharge planning and how to be reached.  Verified patient lives in town, alone, pta indep. Expressed potential need for no other DME.  Verified patient anticipates to go home alone,  at time of discharge. Patient denied needing help with their medication, she has been on xarelto for 3 years and co pay is $45.  Patient drives to MD appointments.  Verified patient has PCP Warner Mccreedy.  Patient states she would like to talk to Chaplain about living will.  Consult put in for Chaplain.   Plan: CM will continue to follow for discharge planning and [facility] resources.                 Action/Plan:   Expected Discharge Date:                  Expected Discharge Plan:  Home/Self Care  In-House Referral:     Discharge planning Services  CM Consult  Post Acute Care Choice:    Choice offered to:     DME Arranged:    DME Agency:     HH Arranged:    HH Agency:     Status of Service:  In process, will continue to follow  Medicare Important Message Given:    Date Medicare IM Given:    Medicare IM give by:    Date Additional Medicare IM Given:    Additional Medicare Important Message give by:     If discussed at Long Length of Stay Meetings, dates discussed:    Additional Comments:  Leone Haven, RN 10/23/2015, 11:50 AM

## 2015-10-23 NOTE — Progress Notes (Signed)
Report received from [place], [place] for transfer to [phone]

## 2015-10-24 ENCOUNTER — Telehealth (HOSPITAL_BASED_OUTPATIENT_CLINIC_OR_DEPARTMENT_OTHER): Payer: Self-pay | Admitting: Emergency Medicine

## 2015-10-24 DIAGNOSIS — I4891 Unspecified atrial fibrillation: Secondary | ICD-10-CM

## 2015-10-24 DIAGNOSIS — N390 Urinary tract infection, site not specified: Secondary | ICD-10-CM

## 2015-10-24 DIAGNOSIS — N179 Acute kidney failure, unspecified: Secondary | ICD-10-CM | POA: Diagnosis not present

## 2015-10-24 DIAGNOSIS — E860 Dehydration: Secondary | ICD-10-CM

## 2015-10-24 DIAGNOSIS — R42 Dizziness and giddiness: Secondary | ICD-10-CM | POA: Insufficient documentation

## 2015-10-24 LAB — CBC
HEMATOCRIT: 35.4 % — AB (ref 36.0–46.0)
HEMOGLOBIN: 11.6 g/dL — AB (ref 12.0–15.0)
MCH: 32.5 pg (ref 26.0–34.0)
MCHC: 32.8 g/dL (ref 30.0–36.0)
MCV: 99.2 fL (ref 78.0–100.0)
Platelets: 209 10*3/uL (ref 150–400)
RBC: 3.57 MIL/uL — AB (ref 3.87–5.11)
RDW: 13.3 % (ref 11.5–15.5)
WBC: 7.3 10*3/uL (ref 4.0–10.5)

## 2015-10-24 LAB — COMPREHENSIVE METABOLIC PANEL
ALBUMIN: 3 g/dL — AB (ref 3.5–5.0)
ALK PHOS: 64 U/L (ref 38–126)
ALT: 11 U/L — AB (ref 14–54)
AST: 14 U/L — ABNORMAL LOW (ref 15–41)
Anion gap: 6 (ref 5–15)
BILIRUBIN TOTAL: 0.3 mg/dL (ref 0.3–1.2)
BUN: 10 mg/dL (ref 6–20)
CALCIUM: 8.7 mg/dL — AB (ref 8.9–10.3)
CO2: 25 mmol/L (ref 22–32)
CREATININE: 1.41 mg/dL — AB (ref 0.44–1.00)
Chloride: 108 mmol/L (ref 101–111)
GFR calc Af Amer: 44 mL/min — ABNORMAL LOW (ref >60)
GFR calc non Af Amer: 38 mL/min — ABNORMAL LOW (ref >60)
GLUCOSE: 86 mg/dL (ref 65–99)
Potassium: 4.2 mmol/L (ref 3.5–5.1)
SODIUM: 139 mmol/L (ref 135–145)
TOTAL PROTEIN: 5.8 g/dL — AB (ref 6.5–8.1)

## 2015-10-24 MED ORDER — GI COCKTAIL ~~LOC~~
30.0000 mL | Freq: Once | ORAL | Status: AC
  Administered 2015-10-24: 30 mL via ORAL
  Filled 2015-10-24: qty 30

## 2015-10-24 MED ORDER — CEPASTAT 14.5 MG MT LOZG
1.0000 | LOZENGE | OROMUCOSAL | Status: DC | PRN
  Filled 2015-10-24: qty 9

## 2015-10-24 MED ORDER — DEXTROSE 5 % IV SOLN
1.0000 g | INTRAVENOUS | Status: DC
  Filled 2015-10-24: qty 10

## 2015-10-24 MED ORDER — AMOXICILLIN 500 MG PO CAPS
500.0000 mg | ORAL_CAPSULE | Freq: Three times a day (TID) | ORAL | Status: DC
  Administered 2015-10-24 – 2015-10-25 (×3): 500 mg via ORAL
  Filled 2015-10-24 (×5): qty 1

## 2015-10-24 NOTE — Progress Notes (Signed)
Patient on cardiac monitoring running afib/aflutter rate controlled in 80s. Pt has history of afib. Dr. Arbutus Leas made aware.

## 2015-10-24 NOTE — Progress Notes (Addendum)
PROGRESS NOTE  [name] WUJ:[id number] DOB: [birth year] DOA: 10/22/2015 PCP: Abbe Amsterdam, MD Brief History 65 year old female with a history of paroxysmal fibrillation, COPD, for long QT syndrome status post ICD placement presented to the emergency department because of nausea, vomiting, and diarrhea with generalized weakness. The patient activated EMS, and she was found to have atrial fibrillation with RVR with blood pressure in the low normal range. Initial urinalysis showed pyuria, and the patient was started on ceftriaxone. The patient was given intravenous fluid with improvement of her blood pressure.  Assessment/Plan: Dehydration -Patient had elevated serum creatinine at the time of admission -Secondary to volume depletion from GI losses -Improved with IV fluids UTI -Urine culture shows Escherichia coli -Switched to amoxicillin Atrial fibrillation with RVR -Precipitated by dehydration and UTI -Improved -Rate controlled -Continue metoprolol tartrate -CHADSVASc = 3 (age, female, CAD) -Continue rivaroxaban Prolonged QT syndrome -Status post ICD placement Tobacco abuse -Tobacco cessation discussed Generalized weakness -Multifactorial due to UTI, dehydration, and nature fibrillation with RVR -PT eval Viral gastroenteritis -Vomiting and diarrhea have resolved Anxiety/depression -Continue Zoloft -Continue Valium -Continue mirtazapine when necessary sleep CKD stage 3 -baseline creatinine 1.2-1.4  Family Communication:   Pt at beside Disposition Plan:   Home when medically stable       Procedures/Studies: Dg Chest 2 View  10/23/2015  CLINICAL DATA:  Shortness of breath, low-grade fever, cough, and dizziness; patient experienced nausea vomiting and diarrhea last week history of COPD and long-term smoker Ing. History of cardiac arrest with defibrillator placement. EXAM: CHEST  2 VIEW COMPARISON:  Portable chest x-ray of July 20, 2014 FINDINGS: The  lungs are better inflated today. There is no focal infiltrate. The pulmonary vascularity is prominent bilaterally. There is no pulmonary interstitial edema. The cardiac silhouette is top-normal in size. The permanent pacemaker defibrillator is in normal position. There is no pleural effusion. The bony thorax exhibits no acute abnormality. There is multilevel degenerative disc disease of the thoracic spine. IMPRESSION: There is no evidence of pneumonia. There are chronic stable changes of COPD. Mild central pulmonary vascular prominence may reflect low-grade compensated CHF. Electronically Signed   By: Eliab Closson  [place] M.D.   On: 10/23/2015 14:18         Subjective: Patient complains of sore throat. Denies any dysphagia or odynophagia. Denies any fevers, chills, chest pain, stress breath, vomiting, diarrhea, abdominal pain, dysuria, hematuria.   Objective: Filed Vitals:   10/24/15 0500 10/24/15 0532 10/24/15 0930 10/24/15 1501  BP:  154/92 118/78 107/61  Pulse:  76 65 88  Temp:  97.7 F (36.5 C)    TempSrc:  Oral    Resp:  20  19  Height:      Weight: 80.105 kg (176 lb 9.6 oz)     SpO2:  95%  97%    Intake/Output Summary (Last 24 hours) at 10/24/15 1935 Last data filed at 10/24/15 1350  Gross per 24 hour  Intake   1110 ml  Output      0 ml  Net   1110 ml   Weight change: 2.205 kg (4 lb 13.8 oz) Exam:   General:  Pt is alert, follows commands appropriately, not in acute distress  HEENT: No icterus, No thrush, No neck mass, Walker/AT  Cardiovascular: RRR, S1/S2, no rubs, no gallops  Respiratory: CTA bilaterally, no wheezing, no crackles, no rhonchi  Abdomen: Soft/+BS, non tender, non distended, no guarding  Extremities: No edema, No lymphangitis, No petechiae,  No rashes, no synovitis  Data Reviewed: Basic Metabolic Panel:  Recent Labs Lab 10/21/15 1542 10/23/15 0051 10/23/15 0727 10/24/15 0647  NA 139 141 140 139  K 4.1 3.8 4.0 4.2  CL 106 105 109 108  CO2 GLUCOSE 89 107* 88 86  BUN 25* CREATININE 1.67* 1.41* 1.32* 1.41*  CALCIUM 9.3 8.5* 8.1* 8.7*   Liver Function Tests:  Recent Labs Lab 10/21/15 1542 10/23/15 0727 10/24/15 0647  AST 21 17 14*  ALT 16 12* 11*  ALKPHOS 66 58 64  BILITOT 0.3 0.2* 0.3  PROT 6.3* 5.4* 5.8*  ALBUMIN 3.4* 3.0* 3.0*   No results for input(s): LIPASE, AMYLASE in the last 168 hours. No results for input(s): AMMONIA in the last 168 hours. CBC:  Recent Labs Lab 10/21/15 1542 10/23/15 0051 10/23/15 0727 10/24/15 0647  WBC 6.6 7.8 7.9 7.3  NEUTROABS  --  4.6 4.4  --   HGB 14.0 12.4 11.4* 11.6*  HCT 42.6 37.7 34.2* 35.4*  MCV 96.4 97.4 97.4 99.2  PLT 240 201 199 209   Cardiac Enzymes: No results for input(s): CKTOTAL, CKMB, CKMBINDEX, TROPONINI in the last 168 hours. BNP: Invalid input(s): POCBNP CBG: No results for input(s): GLUCAP in the last 168 hours.  Recent Results (from the past 240 hour(s))  Urine culture     Status: None   Collection Time: 10/21/15  4:30 PM  Result Value Ref Range Status   Specimen Description URINE, CLEAN CATCH  Final   Special Requests ADDED 2136  Final   Culture >=100,000 COLONIES/mL ESCHERICHIA COLI  Final   Report Status 10/23/2015 FINAL  Final   Organism ID, Bacteria ESCHERICHIA COLI  Final      Susceptibility   Escherichia coli - MIC*    AMPICILLIN 8 SENSITIVE Sensitive     CEFAZOLIN <=4 SENSITIVE Sensitive     CEFTRIAXONE <=1 SENSITIVE Sensitive     CIPROFLOXACIN <=0.25 SENSITIVE Sensitive     GENTAMICIN <=1 SENSITIVE Sensitive     IMIPENEM <=0.25 SENSITIVE Sensitive     NITROFURANTOIN <=16 SENSITIVE Sensitive     TRIMETH/SULFA <=20 SENSITIVE Sensitive     AMPICILLIN/SULBACTAM 4 SENSITIVE Sensitive     PIP/TAZO <=4 SENSITIVE Sensitive     * >=100,000 COLONIES/mL ESCHERICHIA COLI  MRSA PCR Screening     Status: None   Collection Time: 10/23/15  3:35 AM  Result Value Ref Range Status   MRSA by PCR NEGATIVE NEGATIVE Final    Comment:         The GeneXpert MRSA Assay (FDA approved for NASAL specimens only), is one component of a comprehensive MRSA colonization surveillance program. It is not intended to diagnose MRSA infection nor to guide or monitor treatment for MRSA infections.      Scheduled Meds: . cefTRIAXone (ROCEPHIN)  IV  1 g Intravenous Q24H  . metoprolol tartrate  25 mg Oral BID  . potassium chloride SA  20 mEq Oral Daily  . Rivaroxaban  15 mg Oral QHS  . sertraline  200 mg Oral QHS  . simvastatin  20 mg Oral QHS   Continuous Infusions:    Kasondra Junod, DO  [facility] Pager [phone]  If 7PM-7AM, please contact night-coverage [email or web address] Password [email or web address] 10/24/2015, 7:35 PM   LOS: 1 day

## 2015-10-24 NOTE — Progress Notes (Signed)
Patient complains of heartburn and requests for medications to help with it. Merdis Delay MD paged and notified.

## 2015-10-24 NOTE — Telephone Encounter (Signed)
Post ED Visit - Positive Culture Follow-up  Culture report reviewed by antimicrobial stewardship pharmacist:   Enzo Bi, Pharm.D.  Celedonio Miyamoto, Pharm.D., BCPS  Garvin Fila, Pharm.D.  Georgina Pillion, Pharm.D., BCPS  [place], [place].D., BCPS, AAHIVP  Estella Husk, Pharm.D., BCPS, AAHIVP  Tennis Must, [address].D.  Sherle Poe, [address].D.  Positive urine culture E. coli Treated with cephalexin, organism sensitive to the same and no further patient follow-up is required at this time.  Berle Mull 10/24/2015, 10:54 AM

## 2015-10-25 ENCOUNTER — Other Ambulatory Visit: Payer: Self-pay | Admitting: *Deleted

## 2015-10-25 DIAGNOSIS — I4581 Long QT syndrome: Secondary | ICD-10-CM

## 2015-10-25 LAB — BASIC METABOLIC PANEL
Anion gap: 9 (ref 5–15)
BUN: 11 mg/dL (ref 6–20)
CHLORIDE: 102 mmol/L (ref 101–111)
CO2: 27 mmol/L (ref 22–32)
CREATININE: 1.3 mg/dL — AB (ref 0.44–1.00)
Calcium: 8.8 mg/dL — ABNORMAL LOW (ref 8.9–10.3)
GFR calc Af Amer: 48 mL/min — ABNORMAL LOW (ref >60)
GFR, EST NON AFRICAN AMERICAN: 42 mL/min — AB (ref >60)
Glucose, Bld: 92 mg/dL (ref 65–99)
Potassium: 3.9 mmol/L (ref 3.5–5.1)
SODIUM: 138 mmol/L (ref 135–145)

## 2015-10-25 MED ORDER — AMOXICILLIN 500 MG PO CAPS: 500.0000 mg | ORAL_CAPSULE | Freq: Three times a day (TID) | ORAL | Status: DC

## 2015-10-25 NOTE — Evaluation (Signed)
Physical Therapy Evaluation/Discharge  Patient Details Name: [name] MRN: [id number] DOB: [birth year] Today's Date: 10/25/2015   History of Present Illness  65 year old female with a history of paroxysmal fibrillation, COPD, for long QT syndrome status post ICD placement presented to the emergency department because of nausea, vomiting, and diarrhea with generalized weakness.   Clinical Impression  Patient evaluated by Physical Therapy with no further acute PT needs identified. All education has been completed and the patient has no further questions.  See below for any follow-up Physical Therapy or equipment needs. Recommending rw for use in morning due to instability reported with ambulation and fall risk.  PT is signing off, recommending HHPT for continued strengthening and mobility training for improved mobility and safety at home. Patient in agreement. Thank you for this referral.     Follow Up Recommendations Home health PT;Supervision - Intermittent    Equipment Recommendations  Rolling walker with 5" wheels, shower chair    Recommendations for Other Services       Precautions / Restrictions Precautions Precautions: None Restrictions Weight Bearing Restrictions: No      Mobility  Bed Mobility Overal bed mobility: Independent             General bed mobility comments: supine to sit and sit to supine  Transfers Overall transfer level: Independent Equipment used: None             General transfer comment: no instability with standing  Ambulation/Gait Ambulation/Gait assistance: Independent Ambulation Distance (Feet): 150 Feet Assistive device: None Gait Pattern/deviations: Step-through pattern Gait velocity: WFL   General Gait Details: no instability noted during ambulation. Patient reports that she is unstable with standing and walking in the morning and is concerns with falling. Noted increased fatigue upon returning from ambulation.    Stairs Stairs:  (declined attempting, reports feeling confident. )          Wheelchair Mobility    Modified Rankin (Stroke Patients Only)       Balance Overall balance assessment: Independent                                           Pertinent Vitals/Pain Pain Assessment: No/denies pain    Home Living Family/patient expects to be discharged to:: Private residence Living Arrangements: Alone Available Help at Discharge: Friend(s);Available PRN/intermittently;Family Type of Home: House Home Access: Stairs to enter Entrance Stairs-Rails: Right Entrance Stairs-Number of Steps: 4-5 Home Layout: One level Home Equipment: None Additional Comments: States that she has to take "bird baths" because she can't stand in the shower and she does not have a shower chair.     Prior Function Level of Independence: Independent               Hand Dominance        Extremity/Trunk Assessment   Upper Extremity Assessment: Overall WFL for tasks assessed           Lower Extremity Assessment: Generalized weakness         Communication   Communication: No difficulties  Cognition Arousal/Alertness: Awake/alert Behavior During Therapy: WFL for tasks assessed/performed Overall Cognitive Status: Within Functional Limits for tasks assessed                      General Comments      Exercises        Assessment/Plan  PT Assessment Patent does not need any further PT services  PT Diagnosis     PT Problem List    PT Treatment Interventions     PT Goals (Current goals can be found in the Care Plan section) Acute Rehab PT Goals Patient Stated Goal: go back home PT Goal Formulation: All assessment and education complete, DC therapy Potential to Achieve Goals: Good    Frequency     Barriers to discharge        Co-evaluation               End of Session Equipment Utilized During Treatment: Gait belt Activity Tolerance:  Patient tolerated treatment well;Other (comment) (mild fatigue at end of ambulation) Patient left: in bed;with call bell/phone within reach Nurse Communication: Mobility status    Functional Assessment Tool Used: clinical judgment Functional Limitation: Mobility: Walking and moving around Mobility: Walking and Moving Around Current Status (Z6109): At least 1 percent but less than 20 percent impaired, limited or restricted Mobility: Walking and Moving Around Goal Status [phone]): At least 1 percent but less than 20 percent impaired, limited or restricted Mobility: Walking and Moving Around Discharge Status [phone]): At least 1 percent but less than 20 percent impaired, limited or restricted    Time: 9147-8295 PT Time Calculation (min) (ACUTE ONLY): 14 min   Charges:   PT Evaluation $PT Eval Low Complexity: 1 Procedure     PT G Codes:   PT G-Codes **NOT FOR INPATIENT CLASS** Functional Assessment Tool Used: clinical judgment Functional Limitation: Mobility: Walking and moving around Mobility: Walking and Moving Around Current Status (A2130): At least 1 percent but less than 20 percent impaired, limited or restricted Mobility: Walking and Moving Around Goal Status [phone]): At least 1 percent but less than 20 percent impaired, limited or restricted Mobility: Walking and Moving Around Discharge Status [phone]): At least 1 percent but less than 20 percent impaired, limited or restricted    Christiane Ha, PT, CSCS Pager [phone] Office [phone]  10/25/2015, 4:26 PM

## 2015-10-25 NOTE — Progress Notes (Signed)
Patient discharge teaching given, including activity, diet, follow-up appoints, and medications. Patient verbalized understanding of all discharge instructions. IV access was d/c'd. Vitals are stable. Skin is intact except as charted in most recent assessments. Pt to be escorted out by NT, to be driven home by family. 

## 2015-10-25 NOTE — Consult Note (Signed)
   [facility] CM Inpatient Consult   10/25/2015  [name] 1950/11/29 [id number]   Patient screened for [facility] Care Management services. Went to bedside to speak with patient about [facility] Care Management program. Written consent obtained. She reports she lives alone, denies having issues with transportation or with affording medications. States she still smokes infrequently but plans on quitting smoking after discharge. She states she does not eat well at home and that she would like to have meals on wheels.  She also mentioned that she would eventually, not now, look into ALFs as " I do not want my kids to have to take care of me". Discussed that Clinical research associate can also make referral to [facility] Licensed CSW for assistance with community resources. Confirmed best contact number for patient as [phone] and Primary Care MD as Dr. Patsy Lager.  Explained that she will receive post hospital transition of care calls and will be evaluated for monthly home visits. Will make inpatient RNCM aware that [facility] to follow. Left [facility] Care Management packet and contact information at bedside. Appreciative of visit.   Will request for [facility] RNCM and for [facility] Licensed CSW follow up.   Raiford Noble, MSN-Ed, RN,BSN [facility] Liaison [phone]

## 2015-10-25 NOTE — Discharge Summary (Signed)
Physician Discharge Summary  [name] ZOX:[id number] DOB: 07/27/1951 DOA: 10/22/2015  PCP: Abbe Amsterdam, MD  Admit date: 10/22/2015 Discharge date: 10/25/2015  Recommendations for Outpatient Follow-up:  1. Pt will need to follow up with PCP in 2 weeks post discharge 2. Please obtain BMP in 1-2 weeks  Discharge Diagnoses:  Brief History 65 year old female with a history of paroxysmal fibrillation, COPD, for long QT syndrome status post ICD placement presented to the emergency department because of nausea, vomiting, and diarrhea with generalized weakness. The patient activated EMS, and she was found to have atrial fibrillation with RVR with blood pressure in the low normal range. She was given intravenous Lopressor in the emergency department and restarted back on her home dose metoprolol tartrate. Initial urinalysis showed pyuria, and the patient was started on ceftriaxone. The patient was given intravenous fluid with improvement of her blood pressure.  Dehydration -Patient had elevated serum creatinine at the time of admission -Secondary to volume depletion from GI losses -Improved with IV fluids UTI -Urine culture shows Escherichia coli -initially placed on ceftriaxone -Switched to amoxicillin--4 more days after d/c to finish 1 week to therapy Atrial fibrillation with RVR -Precipitated by dehydration and UTI -Improved -Rate controlled -Continue metoprolol tartrate -reverted back to SR on day of d/c -CHADSVASc = 3 (age, female, CAD) -Continue rivaroxaban Prolonged QT syndrome -Status post ICD placement Tobacco abuse -Tobacco cessation discussed Generalized weakness -Multifactorial due to UTI, dehydration, and nature fibrillation with RVR -PT eval Viral gastroenteritis -Vomiting and diarrhea have resolved -tolerating diet Anxiety/depression -Continue Zoloft -Continue Valium -Continue mirtazapine when necessary sleep CKD stage 3 -baseline creatinine  1.2-1.4 -Serum creatinine 1.67 on the day of admission -Serum creatinine 1.30 on the day of discharge  Discharge Condition: stable  Disposition: home  Diet:heart healthy Wt Readings from Last 3 Encounters:  10/25/15 77.973 kg (171 lb 14.4 oz)  10/21/15 72.576 kg (160 lb)  08/01/15 72.576 kg (160 lb)    History of present illness:  65 year old female with a history of paroxysmal fibrillation, COPD, for long QT syndrome status post ICD placement presented to the emergency department because of nausea, vomiting, and diarrhea with generalized weakness. The patient activated EMS, and she was found to have atrial fibrillation with RVR with blood pressure in the low normal range. Initial urinalysis showed pyuria, and the patient was started on ceftriaxone. The patient was given intravenous fluid with improvement of her blood pressure.  Discharge Exam: Filed Vitals:   10/24/15 2102 10/25/15 0931  BP: 112/77 138/79  Pulse: 81 82  Temp: 98 F (36.7 C)   Resp: 18    Filed Vitals:   10/24/15 1501 10/24/15 2102 10/25/15 0700 10/25/15 0931  BP: 107/61 112/77  138/79  Pulse: 88 81  82  Temp:  98 F (36.7 C)    TempSrc:  Oral    Resp: 19 18    Height:      Weight:   77.973 kg (171 lb 14.4 oz)   SpO2: 97% 99%     General: A&O x 3, NAD, pleasant, cooperative Cardiovascular: RRR, no rub, no gallop, no S3 Respiratory: CTAB, no wheeze, no rhonchi Abdomen:soft, nontender, nondistended, positive bowel sounds Extremities: 1 +LE nonpitting edema, No lymphangitis, no petechiae  Discharge Instructions      Discharge Instructions    Diet - low sodium heart healthy    Complete by:  As directed      Increase activity slowly    Complete by:  As directed  Medication List    STOP taking these medications        cephALEXin 500 MG capsule  Commonly known as:  KEFLEX     HYDROcodone-acetaminophen 5-325 MG tablet  Commonly known as:  NORCO     loperamide 2 MG tablet   Commonly known as:  IMODIUM A-D     potassium chloride SA 20 MEQ tablet  Commonly known as:  K-DUR,KLOR-CON      TAKE these medications        acetaminophen 500 MG tablet  Commonly known as:  TYLENOL  Take 1,000 mg by mouth every 6 (six) hours as needed for headache (pain).     albuterol 108 (90 Base) MCG/ACT inhaler  Commonly known as:  PROVENTIL HFA;VENTOLIN HFA  Inhale 2 puffs into the lungs every 6 (six) hours as needed for wheezing.     amoxicillin 500 MG capsule  Commonly known as:  AMOXIL  Take 1 capsule (500 mg total) by mouth every 8 (eight) hours.     diazepam 5 MG tablet  Commonly known as:  VALIUM  Take 1 tablet (5 mg total) by mouth every 12 (twelve) hours as needed for anxiety.     diphenhydrAMINE 25 MG tablet  Commonly known as:  BENADRYL  Take 50 mg by mouth every 6 (six) hours as needed for allergies or sleep.     metoprolol tartrate 25 MG tablet  Commonly known as:  LOPRESSOR  Take 1 tablet (25 mg total) by mouth 2 (two) times daily.     mirtazapine 15 MG tablet  Commonly known as:  REMERON  Take 15-30 mg by mouth at bedtime as needed (sleep).     oxymetazoline 0.05 % nasal spray  Commonly known as:  AFRIN  Place 2 sprays into both nostrils 2 (two) times daily as needed for congestion.     Rivaroxaban 15 MG Tabs tablet  Commonly known as:  XARELTO  Take 1 tablet (15 mg total) by mouth at bedtime.     sertraline 100 MG tablet  Commonly known as:  ZOLOFT  Take 200 mg by mouth at bedtime.     simvastatin 20 MG tablet  Commonly known as:  ZOCOR  Take 1 tablet (20 mg total) by mouth at bedtime.         The results of significant diagnostics from this hospitalization (including imaging, microbiology, ancillary and laboratory) are listed below for reference.    Significant Diagnostic Studies: Dg Chest 2 View  10/23/2015  CLINICAL DATA:  Shortness of breath, low-grade fever, cough, and dizziness; patient experienced nausea vomiting and diarrhea  last week history of COPD and long-term smoker Ing. History of cardiac arrest with defibrillator placement. EXAM: CHEST  2 VIEW COMPARISON:  Portable chest x-ray of July 20, 2014 FINDINGS: The lungs are better inflated today. There is no focal infiltrate. The pulmonary vascularity is prominent bilaterally. There is no pulmonary interstitial edema. The cardiac silhouette is top-normal in size. The permanent pacemaker defibrillator is in normal position. There is no pleural effusion. The bony thorax exhibits no acute abnormality. There is multilevel degenerative disc disease of the thoracic spine. IMPRESSION: There is no evidence of pneumonia. There are chronic stable changes of COPD. Mild central pulmonary vascular prominence may reflect low-grade compensated CHF. Electronically Signed   By: Olia Hinderliter  [place] M.D.   On: 10/23/2015 14:18     Microbiology: Recent Results (from the past 240 hour(s))  Urine culture     Status: None   Collection  Time: 10/21/15  4:30 PM  Result Value Ref Range Status   Specimen Description URINE, CLEAN CATCH  Final   Special Requests ADDED 2136  Final   Culture >=100,000 COLONIES/mL ESCHERICHIA COLI  Final   Report Status 10/23/2015 FINAL  Final   Organism ID, Bacteria ESCHERICHIA COLI  Final      Susceptibility   Escherichia coli - MIC*    AMPICILLIN 8 SENSITIVE Sensitive     CEFAZOLIN <=4 SENSITIVE Sensitive     CEFTRIAXONE <=1 SENSITIVE Sensitive     CIPROFLOXACIN <=0.25 SENSITIVE Sensitive     GENTAMICIN <=1 SENSITIVE Sensitive     IMIPENEM <=0.25 SENSITIVE Sensitive     NITROFURANTOIN <=16 SENSITIVE Sensitive     TRIMETH/SULFA <=20 SENSITIVE Sensitive     AMPICILLIN/SULBACTAM 4 SENSITIVE Sensitive     PIP/TAZO <=4 SENSITIVE Sensitive     * >=100,000 COLONIES/mL ESCHERICHIA COLI  MRSA PCR Screening     Status: None   Collection Time: 10/23/15  3:35 AM  Result Value Ref Range Status   MRSA by PCR NEGATIVE NEGATIVE Final    Comment:        The GeneXpert  MRSA Assay (FDA approved for NASAL specimens only), is one component of a comprehensive MRSA colonization surveillance program. It is not intended to diagnose MRSA infection nor to guide or monitor treatment for MRSA infections.      Labs: Basic Metabolic Panel:  Recent Labs Lab 10/21/15 1542 10/23/15 0051 10/23/15 0727 10/24/15 0647 10/25/15 0527  NA 139 141 140 139 138  K 4.1 3.8 4.0 4.2 3.9  CL 106 105 109 108 102  CO2 GLUCOSE 89 107* 88 86 92  BUN 25* CREATININE 1.67* 1.41* 1.32* 1.41* 1.30*  CALCIUM 9.3 8.5* 8.1* 8.7* 8.8*   Liver Function Tests:  Recent Labs Lab 10/21/15 1542 10/23/15 0727 10/24/15 0647  AST 21 17 14*  ALT 16 12* 11*  ALKPHOS 66 58 64  BILITOT 0.3 0.2* 0.3  PROT 6.3* 5.4* 5.8*  ALBUMIN 3.4* 3.0* 3.0*   No results for input(s): LIPASE, AMYLASE in the last 168 hours. No results for input(s): AMMONIA in the last 168 hours. CBC:  Recent Labs Lab 10/21/15 1542 10/23/15 0051 10/23/15 0727 10/24/15 0647  WBC 6.6 7.8 7.9 7.3  NEUTROABS  --  4.6 4.4  --   HGB 14.0 12.4 11.4* 11.6*  HCT 42.6 37.7 34.2* 35.4*  MCV 96.4 97.4 97.4 99.2  PLT 240 201 199 209   Cardiac Enzymes: No results for input(s): CKTOTAL, CKMB, CKMBINDEX, TROPONINI in the last 168 hours. BNP: Invalid input(s): POCBNP CBG: No results for input(s): GLUCAP in the last 168 hours.  Time coordinating discharge:  Greater than 30 minutes  Signed:  Teron Blais, DO [facility] Pager: [phone] 10/25/2015, 12:07 PM

## 2015-10-26 ENCOUNTER — Encounter: Payer: Self-pay | Admitting: Licensed Clinical Social Worker

## 2015-10-26 ENCOUNTER — Other Ambulatory Visit: Payer: Self-pay | Admitting: Licensed Clinical Social Worker

## 2015-10-26 ENCOUNTER — Other Ambulatory Visit: Payer: Self-pay | Admitting: *Deleted

## 2015-10-26 ENCOUNTER — Encounter: Payer: Self-pay | Admitting: Internal Medicine

## 2015-10-26 ENCOUNTER — Encounter: Payer: Self-pay | Admitting: *Deleted

## 2015-10-26 NOTE — Patient Outreach (Signed)
10/26/15- Telephone call to patient for transition of care week 1, spoke with son (permission given per consent form) [name] who reports pt is with him at present and they are not at home, they are out picking up medications from pharmacy, etc.  [name] states patient stayed with him last night and her discharge summary and other things would be at patient's home.  Pt has transportation provided by family, [name] will make hospital follow up MD appointment today.  [name] states home health PT was briefly discussed during hospital stay but home health not ordered according to discharge instructions, not sure about patient's homebound status and unable to talk further with son due to poor cell phone connection, RN CM informed son that pt will be contacted by primary RN [name] next week and can talk further about whether pt is in need of PT and if she qualifies, son agreeable to this plan.   RN CM faxed today's transition of care note to primary MD Dr. Patsy Lager, sent barrier letter as well.  RN CM sent In basket message with report to Brittney Lovely NP.  Outpatient Encounter Prescriptions as of 10/26/2015  Medication Sig  . acetaminophen (TYLENOL) 500 MG tablet Take 1,000 mg by mouth every 6 (six) hours as needed for headache (pain).   Marland Kitchen albuterol (PROVENTIL HFA;VENTOLIN HFA) 108 (90 BASE) MCG/ACT inhaler Inhale 2 puffs into the lungs every 6 (six) hours as needed for wheezing.  Marland Kitchen amoxicillin (AMOXIL) 500 MG capsule Take 1 capsule (500 mg total) by mouth every 8 (eight) hours.  . diazepam (VALIUM) 5 MG tablet Take 1 tablet (5 mg total) by mouth every 12 (twelve) hours as needed for anxiety. (Patient taking differently: Take 5 mg by mouth 3 (three) times daily as needed for anxiety. )  . metoprolol tartrate (LOPRESSOR) 25 MG tablet Take 1 tablet (25 mg total) by mouth 2 (two) times daily. (Patient taking differently: Take 25 mg by mouth See admin instructions. Take 1 tablet (25 mg) by mouth twice daily, may  take an additional tablet mid-day as needed for afib)  . mirtazapine (REMERON) 15 MG tablet Take 15-30 mg by mouth at bedtime as needed (sleep).   Marland Kitchen oxymetazoline (AFRIN) 0.05 % nasal spray Place 2 sprays into both nostrils 2 (two) times daily as needed for congestion.  . Rivaroxaban (XARELTO) 15 MG TABS tablet Take 1 tablet (15 mg total) by mouth at bedtime.  . sertraline (ZOLOFT) 100 MG tablet Take 200 mg by mouth at bedtime.   . simvastatin (ZOCOR) 20 MG tablet Take 1 tablet (20 mg total) by mouth at bedtime.  . diphenhydrAMINE (BENADRYL) 25 MG tablet Take 50 mg by mouth every 6 (six) hours as needed for allergies or sleep.    No facility-administered encounter medications on file as of 10/26/2015.    THN CM Care Plan Problem Two        Most Recent Value   Care Plan Problem Two  Paitent at risk for hospital readmission related to disease process   Role Documenting the Problem Two  Care Management Coordinator   Care Plan for Problem Two  Active   Interventions for Problem Two Long Term Goal   RN CM reviewed importance of calling early for change in health status, symptoms, taking all medications as prescribed and attending all follow up appointments..   THN Long Term Goal (31-90) days  Patient will have no hospital admissions within 90 days.   THN Long Term Goal Start Date  10/26/15   THN CM Short Term Goal #1 (0-30 days)  Patient (or son) will call today to schedule hospital follow up appointment to see primary MD within 10 days.   THN CM Short Term Goal #1 Start Date  10/26/15   Interventions for Short Term Goal #2   RN CM talked with son about importance of hospital follow up appointment within 10 days, son is to call today and make appointment, verbalizes he does not need assistance with this.      PLAN Continue weekly transition of care calls  Irving Shows [facility], BSN [facility] Care Coordinator [phone]

## 2015-10-26 NOTE — Patient Outreach (Signed)
Assessment:  CSW received referral on [facility]heal[facility] M. Haubner. CSW completed chart review on client on 10/26/15. CSW called home and cell numbers for client on 10/26/15 but was not able to communicate with client. CSW called phone numbers of son of client on 10/26/15 and was not able to communicate with son of client. CSW then called Al Corpus, daughter of client on 10/26/15 and spoke via phone with Al Corpus. CSW verified identity of Al Corpus. Marylu Lund said that client may not have a working phone at present. She said client is planning to pay her phone bill soon and hopefully client phone will be working in near future.  CSW asked for  permission of Marylu Lund to speak with her verbally about client needs. Al Corpus gave CSW verbal permission on 10/26/15 to speak with her about client needs.  Marylu Lund said that client had been to the emergency room several times lately. She said that her brother lives close to client and often transports client to and from client scheduled medical appointments.  CSW and Al Corpus completed needed [facility] assessments on client on 10/26/15. Client has already completed a [facility] consent form.  Marylu Lund said client uses a cane to ambulate.  Marylu Lund and CSW spoke of client care plan. Marylu Lund said that she thought a good care plan goal for client would be for client to attend all scheduled client medical appointments in the next 30 days. CSW encouraged that Marylu Lund and her brother work with client to help her attend scheduled medical appointments in next 30 days. CSW gave Marylu Lund [facility] CSW number of [phone]. CSW asked if Marylu Lund could give CSW name and phone number to client and to her brother.  Marylu Lund said she would give CSW name and phone number to client and to her brother.  CSW encouraged Marylu Lund, her brother or client to call CSW at [phone] as needed for social work support for client. CSW also mentioned to [place] that [name], nurse practitioner, had received referral for nursing support  for [employer]. Marylu Lund was appreciative of this information and appreciative of phone call from CSW on 10/26/15.    Plan:  Client to attend scheduled client medical appointments in next 30 days. CSW to collaborate with [name] nurse practitioner, in monitoring client needs. CSW to call client/Janet Whetzel in 2 weeks to assess client needs at that time.   Kelton Pillar.Myrna Vonseggern MSW, LCSW Licensed Clinical Social Worker [facility] Care Management [phone]

## 2015-10-29 ENCOUNTER — Telehealth: Payer: Self-pay | Admitting: *Deleted

## 2015-10-29 ENCOUNTER — Other Ambulatory Visit: Payer: Self-pay | Admitting: *Deleted

## 2015-10-29 NOTE — Telephone Encounter (Signed)
Merlyn Albert from [facility] needs a verbal order for a Child psychotherapist.  [phone]

## 2015-10-30 NOTE — Telephone Encounter (Signed)
Called and gave VO 

## 2015-10-31 ENCOUNTER — Encounter: Payer: Self-pay | Admitting: *Deleted

## 2015-10-31 ENCOUNTER — Other Ambulatory Visit: Payer: Self-pay | Admitting: *Deleted

## 2015-10-31 DIAGNOSIS — I480 Paroxysmal atrial fibrillation: Secondary | ICD-10-CM

## 2015-10-31 NOTE — Patient Outreach (Signed)
Spoke to pt son after I could not reach pt at either number listed for her personally. He states she has a new cell phone and he will let her know I am trying to call her.  Pt called back when I was on another call, she did not leave a complete phone number and her caller ID did not show up on my phone.  I will try to reach her on Wednesday.  [name] [facility] Care Manager [phone]

## 2015-10-31 NOTE — Patient Outreach (Addendum)
Transition of care call follow up from #1: Pt is home alone, doing fair. She is receiving PT but no nursing. She says she was admitted with sinusitus and UTI that went septic. She does have a hx of AFIB and has a defibrillator in place. She says this has never fired. She has completed her antibiotic course. She doesn't have an appt with Dr. Dallas Schimke yet for follow up.  She asks what can she do about getting her Xarelto. The [facility] pharmacy was going to charge her $248.00.  I asked her to call and get and appt with Dr. Dallas Schimke and call me back as she has agreed to a home visit with me for disease management. She has COPD and does not have any respiritory meds, although she is supposed to have albuterol!  A:  Financial challenges - needs pharmacy assist to get Xarelto      COPD - no albuterol!   P:  Refer for pharmacy assist - Xarelto      See pt Monday, Feb. 13 for initial home visit.  [facility] CM Care Plan Problem One        Most Recent Value   Care Plan Problem One  Client needs to attend scheduled medical appointments   Role Documenting the Problem One  Clinical Social Worker   Care Plan for Problem One  Active   THN CM Short Term Goal #1 (0-30 days)  Client will attend scheduled medical appointmetns for client in next 30 days   THN CM Short Term Goal #1 Start Date  10/26/15   Interventions for Short Term Goal #1  Talked with pt and she will go to her MDs walk in clinic on Saturday. [facility] NP to see her on Monday 11/05/15.    THN CM Care Plan Problem Two        Most Recent Value   Care Plan Problem Two  Paitent at risk for hospital readmission related to disease process   Role Documenting the Problem Two  Care Management Coordinator   Care Plan for Problem Two  Active   Interventions for Problem Two Long Term Goal   RN CM reviewed importance of calling early for change in health status, symptoms, taking all medications as prescribed and attending all follow up appointments..   THN Long Term  Goal (31-90) days  Patient will have no hospital admissions within 90 days.   THN Long Term Goal Start Date  10/26/15   THN CM Short Term Goal #1 (0-30 days)  Patient (or son) will call today to schedule hospital follow up appointment to see primary MD within 10 days.   THN CM Short Term Goal #1 Start Date  10/26/15   Interventions for Short Term Goal #2   RN CM talked with son about importance of hospital follow up appointment within 10 days, son is to call today and make appointment, verbalizes he does not need assistance with this.      [name] [facility] Care Manager [phone]

## 2015-11-04 ENCOUNTER — Encounter (HOSPITAL_COMMUNITY): Payer: Self-pay | Admitting: Emergency Medicine

## 2015-11-04 ENCOUNTER — Emergency Department (HOSPITAL_COMMUNITY)
Admission: EM | Admit: 2015-11-04 | Discharge: 2015-11-04 | Disposition: A | Discharge status: Home / Self Care | Payer: Medicare Other | Attending: Emergency Medicine | Admitting: Emergency Medicine

## 2015-11-04 DIAGNOSIS — J449 Chronic obstructive pulmonary disease, unspecified: Secondary | ICD-10-CM | POA: Insufficient documentation

## 2015-11-04 DIAGNOSIS — Z8674 Personal history of sudden cardiac arrest: Secondary | ICD-10-CM | POA: Diagnosis not present

## 2015-11-04 DIAGNOSIS — M170 Bilateral primary osteoarthritis of knee: Secondary | ICD-10-CM | POA: Diagnosis not present

## 2015-11-04 DIAGNOSIS — E785 Hyperlipidemia, unspecified: Secondary | ICD-10-CM | POA: Insufficient documentation

## 2015-11-04 DIAGNOSIS — F329 Major depressive disorder, single episode, unspecified: Secondary | ICD-10-CM | POA: Diagnosis not present

## 2015-11-04 DIAGNOSIS — I2510 Atherosclerotic heart disease of native coronary artery without angina pectoris: Secondary | ICD-10-CM | POA: Diagnosis not present

## 2015-11-04 DIAGNOSIS — I4891 Unspecified atrial fibrillation: Secondary | ICD-10-CM | POA: Diagnosis not present

## 2015-11-04 DIAGNOSIS — E162 Hypoglycemia, unspecified: Secondary | ICD-10-CM | POA: Diagnosis not present

## 2015-11-04 DIAGNOSIS — Z7901 Long term (current) use of anticoagulants: Secondary | ICD-10-CM | POA: Diagnosis not present

## 2015-11-04 DIAGNOSIS — Z8673 Personal history of transient ischemic attack (TIA), and cerebral infarction without residual deficits: Secondary | ICD-10-CM | POA: Diagnosis not present

## 2015-11-04 DIAGNOSIS — N390 Urinary tract infection, site not specified: Secondary | ICD-10-CM | POA: Diagnosis not present

## 2015-11-04 DIAGNOSIS — R531 Weakness: Secondary | ICD-10-CM | POA: Diagnosis present

## 2015-11-04 DIAGNOSIS — I10 Essential (primary) hypertension: Secondary | ICD-10-CM | POA: Diagnosis not present

## 2015-11-04 DIAGNOSIS — Z79899 Other long term (current) drug therapy: Secondary | ICD-10-CM | POA: Insufficient documentation

## 2015-11-04 DIAGNOSIS — F419 Anxiety disorder, unspecified: Secondary | ICD-10-CM | POA: Insufficient documentation

## 2015-11-04 DIAGNOSIS — Z95810 Presence of automatic (implantable) cardiac defibrillator: Secondary | ICD-10-CM | POA: Insufficient documentation

## 2015-11-04 LAB — URINALYSIS, ROUTINE W REFLEX MICROSCOPIC
BILIRUBIN URINE: NEGATIVE
Glucose, UA: NEGATIVE mg/dL
HGB URINE DIPSTICK: NEGATIVE
KETONES UR: NEGATIVE mg/dL
Nitrite: NEGATIVE
PROTEIN: NEGATIVE mg/dL
Specific Gravity, Urine: 1.02 (ref 1.005–1.030)
pH: 6 (ref 5.0–8.0)

## 2015-11-04 LAB — CBC WITH DIFFERENTIAL/PLATELET
BASOS ABS: 0.1 10*3/uL (ref 0.0–0.1)
BASOS PCT: 1 %
Eosinophils Absolute: 0.4 10*3/uL (ref 0.0–0.7)
Eosinophils Relative: 5 %
HEMATOCRIT: 38.2 % (ref 36.0–46.0)
Hemoglobin: 12.5 g/dL (ref 12.0–15.0)
LYMPHS PCT: 33 %
Lymphs Abs: 2.6 10*3/uL (ref 0.7–4.0)
MCH: 31.3 pg (ref 26.0–34.0)
MCHC: 32.7 g/dL (ref 30.0–36.0)
MCV: 95.7 fL (ref 78.0–100.0)
Monocytes Absolute: 0.8 10*3/uL (ref 0.1–1.0)
Monocytes Relative: 11 %
NEUTROS ABS: 3.9 10*3/uL (ref 1.7–7.7)
Neutrophils Relative %: 50 %
Platelets: 231 10*3/uL (ref 150–400)
RBC: 3.99 MIL/uL (ref 3.87–5.11)
RDW: 12.5 % (ref 11.5–15.5)
WBC: 7.7 10*3/uL (ref 4.0–10.5)

## 2015-11-04 LAB — CBG MONITORING, ED
GLUCOSE-CAPILLARY: 131 mg/dL — AB (ref 65–99)
GLUCOSE-CAPILLARY: 94 mg/dL (ref 65–99)
Glucose-Capillary: 88 mg/dL (ref 65–99)

## 2015-11-04 LAB — BASIC METABOLIC PANEL
Anion gap: 11 (ref 5–15)
BUN: 23 mg/dL — AB (ref 6–20)
CALCIUM: 9.4 mg/dL (ref 8.9–10.3)
CO2: 24 mmol/L (ref 22–32)
Chloride: 105 mmol/L (ref 101–111)
Creatinine, Ser: 1.42 mg/dL — ABNORMAL HIGH (ref 0.44–1.00)
GFR calc Af Amer: 44 mL/min — ABNORMAL LOW (ref >60)
GFR, EST NON AFRICAN AMERICAN: 38 mL/min — AB (ref >60)
Glucose, Bld: 91 mg/dL (ref 65–99)
POTASSIUM: 3.7 mmol/L (ref 3.5–5.1)
Sodium: 140 mmol/L (ref 135–145)

## 2015-11-04 LAB — URINE MICROSCOPIC-ADD ON

## 2015-11-04 MED ORDER — CEPHALEXIN 500 MG PO CAPS: 500.0000 mg | ORAL_CAPSULE | Freq: Four times a day (QID) | ORAL | Status: DC

## 2015-11-04 MED ORDER — CEPHALEXIN 250 MG PO CAPS
500.0000 mg | ORAL_CAPSULE | Freq: Once | ORAL | Status: AC
  Administered 2015-11-04: 500 mg via ORAL
  Filled 2015-11-04: qty 2

## 2015-11-04 NOTE — ED Notes (Signed)
  CBG 131  

## 2015-11-04 NOTE — ED Notes (Signed)
Pt lying on stretcher, alert, looking at videos on cell phone. Asked pt who she can call for ride from ED. Advised her daughter is out of town and her son had back surgery recently and takes pain meds at night. Advised she will attempt to call him in 1 hour. RN advised pt will order her breakfast and she may stay in room for 1 hour or so then may wait in lobby. Pt voiced understanding and agreement.

## 2015-11-04 NOTE — ED Notes (Signed)
Brought in from home via ems for feeling of weakness.  On arrival CBG noted to be 40.  No history of DM.  Does report hx of afib and reports being in afib earlier tonight.  Instant Glucose given.  Repeat CBG-96.  Hx of copd.  Per pt was discharged a couple days ago after having sepsis r/t UTI.  Denies having any urinary issues at this time.

## 2015-11-04 NOTE — ED Notes (Signed)
Drinking OJ at this time.  Breakfast tray ordered.  Moved to pod C until a ride home can be found.

## 2015-11-04 NOTE — ED Provider Notes (Signed)
By signing my name below, I, Arlan Organ, attest that this documentation has been prepared under the direction and in the presence of Naftoli Penny N Mischa Pollard, DO.  Electronically Signed: Arlan Organ, ED Scribe. 11/04/2015. 3:55 AM.   TIME SEEN: 3:51 AM   CHIEF COMPLAINT:  Chief Complaint  Patient presents with  . Weakness  . Hypoglycemia     HPI:  HPI Comments: Analyah ETHELENE CLOSSER brought in by EMS is a 65 y.o. female with a PMHx of CAD, COPD, HLD, ICD placement for prolonged QTC, A-Fib, and stroke who presents to the Emergency Department here for possible hypoglycemia this evening. Pt reports ongoing, constant generalized weakness and started tonight and "feeling poorly". Patient called EMS and she was found to have a CBG of 40. Oral Glucose given en route to department with improvement. She denies any prior history of DM. She is not on any medication for diabetes.  No h/o insulinoma.  She has not taken anyone else's medications.  Reports she has been eating and drinking well. States she did eat dinner tonight. No recent fever, chills, nausea, vomiting, diarrhea, abdominal pain, chest pain, or shortness of breath, cough. Pt was recently evaluated to the emergency department on 1/30 for N/V/D. At that time, pt was started on a antibiotic which she completed yesterday for urinary tract infection. Pt takes Xarelto daily for her atrial fibrillation.  PCP: Abbe Amsterdam, MD    ROS: See HPI Constitutional: no fever  Eyes: no drainage  ENT: no runny nose   Cardiovascular:  no chest pain  Resp: no SOB  GI: no vomiting GU: no dysuria Integumentary: no rash  Allergy: no hives  Musculoskeletal: no leg swelling  Neurological: no slurred speech. Positive weakness  ROS otherwise negative  PAST MEDICAL HISTORY/PAST SURGICAL HISTORY:  Past Medical History  Diagnosis Date  . Coronary artery disease     non obstructive  . Seizures (HCC)   . Anxiety   . Depression   . Migraine   . Fibromuscular  dysplasia (HCC)   . Tobacco abuse   . COPD (chronic obstructive pulmonary disease) (HCC)   . Arthritis of knee     Bilateral  . Tobacco abuse   . Migraine headache   . Anxiety disorder   . Depression   . Seizure disorder (HCC)   . Cerebrovascular disease   . CAD (coronary artery disease)   . Automatic implantable cardiac defibrillator in situ     [employer] ICD  . HTN (hypertension)   . HLD (hyperlipidemia)   . Ventricular fibrillation (HCC)   . Long QT syndrome   . Cardiac arrest (HCC)   . Atrial fibrillation (HCC)     a) 2D echo 09/11/12: LVEF 55-60%, mild LVH, mld LA dilatation  . Allergy   . Stroke [facility])     no deficits    MEDICATIONS:  Prior to Admission medications   Medication Sig Start Date End Date Taking? Authorizing Provider  acetaminophen (TYLENOL) 500 MG tablet Take 1,000 mg by mouth every 6 (six) hours as needed for headache (pain).     Historical Provider, MD  albuterol (PROVENTIL HFA;VENTOLIN HFA) 108 (90 BASE) MCG/ACT inhaler Inhale 2 puffs into the lungs every 6 (six) hours as needed for wheezing. Reported on 10/31/2015    Historical Provider, MD  diazepam (VALIUM) 5 MG tablet Take 1 tablet (5 mg total) by mouth every 12 (twelve) hours as needed for anxiety. Patient taking differently: Take 5 mg by mouth 3 (three) times daily as  needed for anxiety.  06/27/14   Iran Ouch, MD  diphenhydrAMINE (BENADRYL) 25 MG tablet Take 50 mg by mouth every 6 (six) hours as needed for allergies or sleep.     Historical Provider, MD  metoprolol tartrate (LOPRESSOR) 25 MG tablet Take 1 tablet (25 mg total) by mouth 2 (two) times daily. Patient taking differently: Take 25 mg by mouth See admin instructions. Take 1 tablet (25 mg) by mouth twice daily, may take an additional tablet mid-day as needed for afib 06/06/15   Iran Ouch, MD  mirtazapine (REMERON) 15 MG tablet Take 15-30 mg by mouth at bedtime as needed (sleep). Reported on 10/31/2015 10/20/14   Historical  Provider, MD  oxymetazoline (AFRIN) 0.05 % nasal spray Place 2 sprays into both nostrils 2 (two) times daily as needed for congestion. Reported on 10/31/2015    Historical Provider, MD  Rivaroxaban (XARELTO) 15 MG TABS tablet Take 1 tablet (15 mg total) by mouth at bedtime. 07/25/15   Iran Ouch, MD  sertraline (ZOLOFT) 100 MG tablet Take 200 mg by mouth at bedtime.     Historical Provider, MD  simvastatin (ZOCOR) 20 MG tablet Take 1 tablet (20 mg total) by mouth at bedtime. 06/06/15   Iran Ouch, MD    ALLERGIES:  Allergies  Allergen Reactions  . Azithromycin Other (See Comments)    Hypotension   . Ibuprofen Other (See Comments)    Hypotension. Takes ASA without problem   . Codeine Rash  . Demerol [Meperidine] Rash    SOCIAL HISTORY:  Social History  Substance Use Topics  . Smoking status: Current Every Day Smoker -- 0.25 packs/day for 40 years    Types: Cigarettes  . Smokeless tobacco: Never Used     Comment: almost a pack a week  . Alcohol Use: No    FAMILY HISTORY: Family History  Problem Relation Age of Onset  . Dementia Mother   . Stroke Mother     Multiple  . Aneurysm Sister     Brain  . Stroke Sister   . Uterine cancer Other     Grandmother  . Colon cancer Sister   . Cancer Sister     EXAM: BP 126/83 mmHg  Pulse 63  Temp(Src) 97.7 F (36.5 C) (Oral)  Resp 17  Ht  (1.626 m)  Wt 171 lb (77.565 kg)  BMI 29.34 kg/m2  SpO2 99% CONSTITUTIONAL: Alert and oriented and responds appropriately to questions. Well-appearing; well-nourished, elderly and in no distress HEAD: Normocephalic EYES: Conjunctivae clear, PERRL ENT: normal nose; no rhinorrhea; moist mucous membranes; pharynx without lesions noted NECK: Supple, no meningismus, no LAD  CARD: RRR; S1 and S2 appreciated; no murmurs, no clicks, no rubs, no gallops RESP: Normal chest excursion without splinting or tachypnea; breath sounds clear and equal bilaterally; no wheezes, no rhonchi, no  rales, no hypoxia or respiratory distress, speaking full sentences ABD/GI: Normal bowel sounds; non-distended; soft, non-tender, no rebound, no guarding, no peritoneal signs BACK:  The back appears normal and is non-tender to palpation, there is no CVA tenderness EXT: Normal ROM in all joints; non-tender to palpation; no edema; normal capillary refill; no cyanosis, no calf tenderness or swelling    SKIN: Normal color for age and race; warm NEURO: Moves all extremities equally, sensation to light touch intact diffusely, cranial nerves II through XII intact PSYCH: The patient's mood and manner are appropriate. Grooming and personal hygiene are appropriate.  MEDICAL DECISION MAKING: Patient here with an  episode of hypoglycemia. Unclear etiology and she is not on medications for diabetes. Reports eating and drinking normally. Was recently admitted to the hospital for urinary tract infection, atrial fibrillation with RVR. Recently finished antibiotic for UTI yesterday. Culture grew Escherichia coli that was pansensitive. She has no infectious symptoms currently. She is hemodynamically stable. Blood glucoses in the 130s. We'll continue to monitor her blood glucose. We'll obtain labs, urinalysis. Denies fever, chest pain, shortness of breath or cough.  ED PROGRESS: Patient's labs unremarkable. She does have chronic kidney disease which is unchanged. Urine does show leukocytes and bacteria. Will repeat urine culture and dc on Keflex.  Her blood sugar has remained stable. I feel she is safe to be discharged home. Discussed return precautions and recommended that she obtain a glucometer to check her blood glucose at home. She has a PCP for follow-up. She verbalized understanding and is comfortable with this plan.   EKG Interpretation  Date/Time:  Sunday November 04 2015 03:10:00 EST Ventricular Rate:  63 PR Interval:  263 QRS Duration: 126 QT Interval:  455 QTC Calculation: 466 R Axis:   26 Text  Interpretation:  Sinus rhythm Prolonged PR interval Nonspecific intraventricular conduction delay Probable anterior infarct, age indeterminate No significant change since last tracing Confirmed by Latronda Spink,  DO, Navah Grondin ([id number]) on 11/04/2015 3:52:15 AM        I personally performed the services described in this documentation, which was scribed in my presence. The recorded information has been reviewed and is accurate.   Layla Maw Titilayo Hagans, DO 11/04/15 [id number]

## 2015-11-04 NOTE — Discharge Instructions (Signed)
Hypoglycemia Hypoglycemia occurs when the glucose in your blood is too low. Glucose is a type of sugar that is your body's main energy source. Hormones, such as insulin and glucagon, control the level of glucose in the blood. Insulin lowers blood glucose and glucagon increases blood glucose. Having too much insulin in your blood stream, or not eating enough food containing sugar, can result in hypoglycemia. Hypoglycemia can happen to people with or without diabetes. It can develop quickly and can be a medical emergency.  CAUSES   Missing or delaying meals.  Not eating enough carbohydrates at meals.  Taking too much diabetes medicine.  Not timing your oral diabetes medicine or insulin doses with meals, snacks, and exercise.  Nausea and vomiting.  Certain medicines.  Severe illnesses, such as hepatitis, kidney disorders, and certain eating disorders.  Increased activity or exercise without eating something extra or adjusting medicines.  Drinking too much alcohol.  A nerve disorder that affects body functions like your heart rate, blood pressure, and digestion (autonomic neuropathy).  A condition where the stomach muscles do not function properly (gastroparesis). Therefore, medicines and food may not absorb properly.  Rarely, a tumor of the pancreas can produce too much insulin. SYMPTOMS   Hunger.  Sweating (diaphoresis).  Change in body temperature.  Shakiness.  Headache.  Anxiety.  Lightheadedness.  Irritability.  Difficulty concentrating.  Dry mouth.  Tingling or numbness in the hands or feet.  Restless sleep or sleep disturbances.  Altered speech and coordination.  Change in mental status.  Seizures or prolonged convulsions.  Combativeness.  Drowsiness (lethargic).  Weakness.  Increased heart rate or palpitations.  Confusion.  Pale, gray skin color.  Blurred or double vision.  Fainting. DIAGNOSIS  A physical exam and medical history will be  performed. Your caregiver may make a diagnosis based on your symptoms. Blood tests and other lab tests may be performed to confirm a diagnosis. Once the diagnosis is made, your caregiver will see if your signs and symptoms go away once your blood glucose is raised.  TREATMENT  Usually, you can easily treat your hypoglycemia when you notice symptoms.  Check your blood glucose. If it is less than 70 mg/dl, take one of the following:   3-4 glucose tablets.    cup juice.    cup regular soda.   1 cup skim milk.   -1 tube of glucose gel.   5-6 hard candies.   Avoid high-fat drinks or food that may delay a rise in blood glucose levels.  Do not take more than the recommended amount of sugary foods, drinks, gel, or tablets. Doing so will cause your blood glucose to go too high.   Wait 10-15 minutes and recheck your blood glucose. If it is still less than 70 mg/dl or below your target range, repeat treatment.   Eat a snack if it is more than 1 hour until your next meal.  There may be a time when your blood glucose may go so low that you are unable to treat yourself at home when you start to notice symptoms. You may need someone to help you. You may even faint or be unable to swallow. If you cannot treat yourself, someone will need to bring you to the hospital.  [place]  If you have diabetes, follow your diabetes management plan by:  Taking your medicines as directed.  Following your exercise plan.  Following your meal plan. Do not skip meals. Eat on time.  Testing your blood  glucose regularly. Check your blood glucose before and after exercise. If you exercise longer or different than usual, be sure to check blood glucose more frequently.  Wearing your medical alert jewelry that says you have diabetes.  Identify the cause of your hypoglycemia. Then, develop ways to prevent the recurrence of hypoglycemia.  Do not take a hot bath or shower right after an  insulin shot.  Always carry treatment with you. Glucose tablets are the easiest to carry.  If you are going to drink alcohol, drink it only with meals.  Tell friends or family members ways to keep you safe during a seizure. This may include removing hard or sharp objects from the area or turning you on your side.  Maintain a healthy weight. SEEK MEDICAL CARE IF:   You are having problems keeping your blood glucose in your target range.  You are having frequent episodes of hypoglycemia.  You feel you might be having side effects from your medicines.  You are not sure why your blood glucose is dropping so low.  You notice a change in vision or a new problem with your vision. SEEK IMMEDIATE MEDICAL CARE IF:   Confusion develops.  A change in mental status occurs.  The inability to swallow develops.  Fainting occurs.   This information is not intended to replace advice given to you by your health care provider. Make sure you discuss any questions you have with your health care provider.   Document Released: 09/08/2005 Document Revised: 09/13/2013 Document Reviewed: 05/15/2015 Elsevier Interactive Patient Education 2016 Elsevier Inc.  Urinary Tract Infection Urinary tract infections (UTIs) can develop anywhere along your urinary tract. Your urinary tract is your body's drainage system for removing wastes and extra water. Your urinary tract includes two kidneys, two ureters, a bladder, and a urethra. Your kidneys are a pair of bean-shaped organs. Each kidney is about the size of your fist. They are located below your ribs, one on each side of your spine. CAUSES Infections are caused by microbes, which are microscopic organisms, including fungi, viruses, and bacteria. These organisms are so small that they can only be seen through a microscope. Bacteria are the microbes that most commonly cause UTIs. SYMPTOMS  Symptoms of UTIs may vary by age and gender of the patient and by the  location of the infection. Symptoms in young women typically include a frequent and intense urge to urinate and a painful, burning feeling in the bladder or urethra during urination. Older women and men are more likely to be tired, shaky, and weak and have muscle aches and abdominal pain. A fever may mean the infection is in your kidneys. Other symptoms of a kidney infection include pain in your back or sides below the ribs, nausea, and vomiting. DIAGNOSIS To diagnose a UTI, your caregiver will ask you about your symptoms. Your caregiver will also ask you to provide a urine sample. The urine sample will be tested for bacteria and white blood cells. White blood cells are made by your body to help fight infection. TREATMENT  Typically, UTIs can be treated with medication. Because most UTIs are caused by a bacterial infection, they usually can be treated with the use of antibiotics. The choice of antibiotic and length of treatment depend on your symptoms and the type of bacteria causing your infection. HOME CARE INSTRUCTIONS  If you were prescribed antibiotics, take them exactly as your caregiver instructs you. Finish the medication even if you feel better after you have only  taken some of the medication.  Drink enough water and fluids to keep your urine clear or pale yellow.  Avoid caffeine, tea, and carbonated beverages. They tend to irritate your bladder.  Empty your bladder often. Avoid holding urine for long periods of time.  Empty your bladder before and after sexual intercourse.  After a bowel movement, women should cleanse from front to back. Use each tissue only once. SEEK MEDICAL CARE IF:   You have back pain.  You develop a fever.  Your symptoms do not begin to resolve within 3 days. SEEK IMMEDIATE MEDICAL CARE IF:   You have severe back pain or lower abdominal pain.  You develop chills.  You have nausea or vomiting.  You have continued burning or discomfort with  urination. MAKE SURE YOU:   Understand these instructions.  Will watch your condition.  Will get help right away if you are not doing well or get worse.   This information is not intended to replace advice given to you by your health care provider. Make sure you discuss any questions you have with your health care provider.   Document Released: 06/18/2005 Document Revised: 05/30/2015 Document Reviewed: 10/17/2011 Elsevier Interactive Patient Education [employer]

## 2015-11-04 NOTE — ED Notes (Signed)
Discharge instructions explained and signed.  Moved to pod C at this time.

## 2015-11-05 ENCOUNTER — Encounter: Payer: Self-pay | Admitting: *Deleted

## 2015-11-05 ENCOUNTER — Other Ambulatory Visit: Payer: Self-pay | Admitting: *Deleted

## 2015-11-05 LAB — URINE CULTURE

## 2015-11-05 NOTE — Patient Outreach (Addendum)
S:  Initial home visit with pt after admission and subsequent ED visit. Pt admitted and found to have UTI with sepsis, treated and released and then became weak calling 911, glucose found to be 40, taken back to ED and antibiotic treatment given, different medication. Pt to pick up her medication today.  Pt has hx COPD, continues to smoke, but is really trying to cut down. She smokes approximately 5 per day. She does not have an albuterol inhaler at present.  Hx AFIB -pt has pacemaker in place.  Current Outpatient Prescriptions on File Prior to Visit  Medication Sig Dispense Refill  . acetaminophen (TYLENOL) 500 MG tablet Take 1,000 mg by mouth every 6 (six) hours as needed for headache (pain).     Marland Kitchen albuterol (PROVENTIL HFA;VENTOLIN HFA) 108 (90 BASE) MCG/ACT inhaler Inhale 2 puffs into the lungs every 6 (six) hours as needed for wheezing. Reported on 10/31/2015    . cephALEXin (KEFLEX) 500 MG capsule Take 1 capsule (500 mg total) by mouth 4 (four) times daily. 28 capsule 0  . diphenhydrAMINE (BENADRYL) 25 MG tablet Take 50 mg by mouth every 6 (six) hours as needed for allergies or sleep.     . mirtazapine (REMERON) 15 MG tablet Take 15-30 mg by mouth at bedtime as needed (sleep). Reported on 10/31/2015    . oxymetazoline (AFRIN) 0.05 % nasal spray Place 2 sprays into both nostrils 2 (two) times daily as needed for congestion. Reported on 10/31/2015    . Rivaroxaban (XARELTO) 15 MG TABS tablet Take 1 tablet (15 mg total) by mouth at bedtime. 30 tablet 11  . sertraline (ZOLOFT) 100 MG tablet Take 200 mg by mouth at bedtime.     . simvastatin (ZOCOR) 20 MG tablet Take 1 tablet (20 mg total) by mouth at bedtime. 90 tablet 3  . [DISCONTINUED] metoprolol tartrate (LOPRESSOR) 25 MG tablet Take 1 tablet (25 mg total) by mouth 2 (two) times daily. (Patient taking differently: Take 25 mg by mouth See admin instructions. Take 1 tablet (25 mg) by mouth twice daily, may take an additional tablet mid-day as needed  for afib) 180 tablet 3   No current facility-administered medications on file prior to visit.   PT DID NOT DISCONTINUE HER METOPROLOL OR DIAZEPAM!!! FYI AND I AM IN AGREEMENT WITH HER CHOICE.  O:  BP 130/70 mmHg  Pulse 70  Resp 18  SpO2 96%  NFBS 138       RRR       Lungs are clear       Abdomen - Bowel sounds are present throughout.       No edema       Pt gain altered due to severe OA of R knee  A:  Resolving UTI       COPD/ Smoker       AFIB - stable       Chronic pain  P:  Provided pt education on COPD and Smoking Cessation      Discussed Advanced Directives & encouraged completion.      I will see her again in 2 weeks.  Fall Risk  11/05/2015 10/26/2015  Falls in the past year? - Yes  Number falls in past yr: - 2 or more  Injury with Fall? - Yes  Risk Factor Category  - High Fall Risk  Risk for fall due to : History of fall(s);Impaired balance/gait;Impaired mobility History of fall(s);Impaired balance/gait;Impaired mobility  Follow up Falls evaluation completed;Education provided;Falls prevention discussed Falls  prevention discussed   Depression screen [facility] 2/9 11/05/2015 10/26/2015  Decreased Interest 1 1  Down, Depressed, Hopeless 1 1  PHQ - 2 Score 2 2  Altered sleeping 1 1  Tired, decreased energy 1 1  Change in appetite 0 0  Feeling bad or failure about yourself  1 1  Trouble concentrating 0 0  Moving slowly or fidgety/restless 0 1  Suicidal thoughts 0 0  PHQ-9 Score 5 6  Difficult doing work/chores Somewhat difficult Somewhat difficult   [facility] CM Care Plan Problem One        Most Recent Value   Care Plan Problem One  Client needs to attend scheduled medical appointments   Role Documenting the Problem One  Clinical Social Worker   Care Plan for Problem One  Active   [facility] CM Short Term Goal #1 (0-30 days)  Client will attend scheduled medical appointmetns for client in next 30 days   [facility] CM Short Term Goal #1 Start Date  10/26/15   Interventions for Short Term Goal  #1  Talked with pt and she will go to her MDs walk in clinic on Saturday. [facility] NP to see her on Monday 11/05/15.    [facility] CM Care Plan Problem Two        Most Recent Value   Care Plan Problem Two  Paitent at risk for hospital readmission related to disease process   Role Documenting the Problem Two  Care Management Coordinator   Care Plan for Problem Two  Active   Interventions for Problem Two Long Term Goal   RN CM reviewed importance of calling early for change in health status, symptoms, taking all medications as prescribed and attending all follow up appointments..   [facility] Long Term Goal (31-90) days  Patient will have no hospital admissions within 90 days.   [facility] Long Term Goal Start Date  10/26/15   [facility] CM Short Term Goal #1 (0-30 days)  Patient (or son) will call today to schedule hospital follow up appointment to see primary MD within 10 days.   [facility] CM Short Term Goal #1 Start Date  10/26/15     [name] [facility] Care Manager [phone]

## 2015-11-06 ENCOUNTER — Other Ambulatory Visit: Payer: Self-pay

## 2015-11-06 NOTE — Patient Outreach (Signed)
I tried calling the patient to discuss her Xarelto and the need for a prior authorization for it to be filled.   I was not able to leave a message.  I will try again tomorrow.   Steve Rattler, PharmD, [employer] consultant [phone]

## 2015-11-07 ENCOUNTER — Telehealth: Payer: Self-pay

## 2015-11-07 NOTE — Telephone Encounter (Signed)
Spoke with patient today. States she took her last Xarelto last pm. Samples of xarelto  2 bottles provided. Prior auth sent to [facility] for this as well. Patient has [employer] now.

## 2015-11-08 ENCOUNTER — Other Ambulatory Visit: Payer: Self-pay | Admitting: Licensed Clinical Social Worker

## 2015-11-08 ENCOUNTER — Encounter: Payer: Self-pay | Admitting: Licensed Clinical Social Worker

## 2015-11-08 NOTE — Patient Outreach (Signed)
Assessment:   CSW called client phone contact number on 11/08/15. CSW left phone message for client on 11/08/15 asking client to return call to CSW at [phone] to discuss client needs. Client does have new contact number of [phone]. CSW did not receive return call from client on 11/08/15.      Plan: Client to attend all scheduled client medical appointments in next 30 days. CSW to call client in two weeks to assess client needs at that time.Kelton Pillar.Kandee Escalante MSW, LCSW Licensed Clinical Social Worker [facility] Care Management [phone]

## 2015-11-09 ENCOUNTER — Telehealth: Payer: Self-pay

## 2015-11-09 ENCOUNTER — Telehealth: Payer: Self-pay | Admitting: *Deleted

## 2015-11-09 NOTE — Telephone Encounter (Signed)
Xarelto approved through 09/21/2016. [id number].

## 2015-11-09 NOTE — Telephone Encounter (Signed)
Pt requiring PA for Xarelto 15 mg. PA has been faxed Awaiting for approval.

## 2015-11-12 NOTE — Telephone Encounter (Signed)
Pt has been approved for Xarelto 15 mg. 

## 2015-11-16 ENCOUNTER — Emergency Department (HOSPITAL_COMMUNITY)
Admission: EM | Admit: 2015-11-16 | Discharge: 2015-11-16 | Disposition: A | Discharge status: Home / Self Care | Payer: Medicare Other | Attending: Emergency Medicine | Admitting: Emergency Medicine

## 2015-11-16 ENCOUNTER — Encounter (HOSPITAL_COMMUNITY): Payer: Self-pay

## 2015-11-16 ENCOUNTER — Emergency Department (HOSPITAL_COMMUNITY)
Admission: EM | Admit: 2015-11-16 | Discharge: 2015-11-16 | Disposition: A | Discharge status: Home / Self Care | Payer: Medicare Other | Source: Home / Self Care

## 2015-11-16 ENCOUNTER — Emergency Department (HOSPITAL_COMMUNITY): Payer: Medicare Other

## 2015-11-16 ENCOUNTER — Encounter (HOSPITAL_COMMUNITY): Payer: Self-pay | Admitting: Oncology

## 2015-11-16 DIAGNOSIS — R531 Weakness: Secondary | ICD-10-CM | POA: Diagnosis present

## 2015-11-16 DIAGNOSIS — Z79899 Other long term (current) drug therapy: Secondary | ICD-10-CM | POA: Insufficient documentation

## 2015-11-16 DIAGNOSIS — N390 Urinary tract infection, site not specified: Secondary | ICD-10-CM

## 2015-11-16 DIAGNOSIS — F329 Major depressive disorder, single episode, unspecified: Secondary | ICD-10-CM | POA: Insufficient documentation

## 2015-11-16 DIAGNOSIS — I951 Orthostatic hypotension: Secondary | ICD-10-CM | POA: Diagnosis not present

## 2015-11-16 DIAGNOSIS — Z95810 Presence of automatic (implantable) cardiac defibrillator: Secondary | ICD-10-CM | POA: Diagnosis not present

## 2015-11-16 DIAGNOSIS — Z8673 Personal history of transient ischemic attack (TIA), and cerebral infarction without residual deficits: Secondary | ICD-10-CM | POA: Insufficient documentation

## 2015-11-16 DIAGNOSIS — F419 Anxiety disorder, unspecified: Secondary | ICD-10-CM | POA: Diagnosis not present

## 2015-11-16 DIAGNOSIS — I2510 Atherosclerotic heart disease of native coronary artery without angina pectoris: Secondary | ICD-10-CM | POA: Insufficient documentation

## 2015-11-16 DIAGNOSIS — F17210 Nicotine dependence, cigarettes, uncomplicated: Secondary | ICD-10-CM | POA: Insufficient documentation

## 2015-11-16 DIAGNOSIS — G40909 Epilepsy, unspecified, not intractable, without status epilepticus: Secondary | ICD-10-CM | POA: Diagnosis not present

## 2015-11-16 DIAGNOSIS — Z8739 Personal history of other diseases of the musculoskeletal system and connective tissue: Secondary | ICD-10-CM | POA: Diagnosis not present

## 2015-11-16 DIAGNOSIS — I10 Essential (primary) hypertension: Secondary | ICD-10-CM | POA: Insufficient documentation

## 2015-11-16 DIAGNOSIS — Z8674 Personal history of sudden cardiac arrest: Secondary | ICD-10-CM | POA: Insufficient documentation

## 2015-11-16 DIAGNOSIS — Z7901 Long term (current) use of anticoagulants: Secondary | ICD-10-CM | POA: Insufficient documentation

## 2015-11-16 DIAGNOSIS — I4891 Unspecified atrial fibrillation: Secondary | ICD-10-CM | POA: Diagnosis not present

## 2015-11-16 DIAGNOSIS — E785 Hyperlipidemia, unspecified: Secondary | ICD-10-CM | POA: Diagnosis not present

## 2015-11-16 DIAGNOSIS — J449 Chronic obstructive pulmonary disease, unspecified: Secondary | ICD-10-CM | POA: Diagnosis not present

## 2015-11-16 LAB — CBC WITH DIFFERENTIAL/PLATELET
BASOS ABS: 0.1 10*3/uL (ref 0.0–0.1)
Basophils Relative: 1 %
Eosinophils Absolute: 0.3 10*3/uL (ref 0.0–0.7)
Eosinophils Relative: 5 %
HEMATOCRIT: 40 % (ref 36.0–46.0)
HEMOGLOBIN: 12.9 g/dL (ref 12.0–15.0)
LYMPHS ABS: 2.8 10*3/uL (ref 0.7–4.0)
LYMPHS PCT: 37 %
MCH: 31.1 pg (ref 26.0–34.0)
MCHC: 32.3 g/dL (ref 30.0–36.0)
MCV: 96.4 fL (ref 78.0–100.0)
Monocytes Absolute: 0.4 10*3/uL (ref 0.1–1.0)
Monocytes Relative: 6 %
NEUTROS ABS: 3.9 10*3/uL (ref 1.7–7.7)
Neutrophils Relative %: 51 %
Platelets: 240 10*3/uL (ref 150–400)
RBC: 4.15 MIL/uL (ref 3.87–5.11)
RDW: 13 % (ref 11.5–15.5)
WBC: 7.4 10*3/uL (ref 4.0–10.5)

## 2015-11-16 LAB — URINE MICROSCOPIC-ADD ON

## 2015-11-16 LAB — COMPREHENSIVE METABOLIC PANEL
ALK PHOS: 77 U/L (ref 38–126)
ALT: 12 U/L — AB (ref 14–54)
AST: 18 U/L (ref 15–41)
Albumin: 3.4 g/dL — ABNORMAL LOW (ref 3.5–5.0)
Anion gap: 9 (ref 5–15)
BILIRUBIN TOTAL: 0.4 mg/dL (ref 0.3–1.2)
BUN: 27 mg/dL — AB (ref 6–20)
CALCIUM: 9.2 mg/dL (ref 8.9–10.3)
CHLORIDE: 106 mmol/L (ref 101–111)
CO2: 25 mmol/L (ref 22–32)
CREATININE: 1.6 mg/dL — AB (ref 0.44–1.00)
GFR, EST AFRICAN AMERICAN: 38 mL/min — AB (ref >60)
GFR, EST NON AFRICAN AMERICAN: 33 mL/min — AB (ref >60)
Glucose, Bld: 100 mg/dL — ABNORMAL HIGH (ref 65–99)
Potassium: 4.2 mmol/L (ref 3.5–5.1)
Sodium: 140 mmol/L (ref 135–145)
TOTAL PROTEIN: 6.2 g/dL — AB (ref 6.5–8.1)

## 2015-11-16 LAB — URINALYSIS, ROUTINE W REFLEX MICROSCOPIC
BILIRUBIN URINE: NEGATIVE
Glucose, UA: NEGATIVE mg/dL
KETONES UR: NEGATIVE mg/dL
Nitrite: NEGATIVE
Protein, ur: NEGATIVE mg/dL
SPECIFIC GRAVITY, URINE: 1.024 (ref 1.005–1.030)
pH: 5.5 (ref 5.0–8.0)

## 2015-11-16 LAB — I-STAT TROPONIN, ED: TROPONIN I, POC: 0 ng/mL (ref 0.00–0.08)

## 2015-11-16 LAB — I-STAT CG4 LACTIC ACID, ED: Lactic Acid, Venous: 1.14 mmol/L (ref 0.5–2.0)

## 2015-11-16 MED ORDER — SODIUM CHLORIDE 0.9 % IV BOLUS (SEPSIS)
1000.0000 mL | Freq: Once | INTRAVENOUS | Status: AC
  Administered 2015-11-16: 1000 mL via INTRAVENOUS

## 2015-11-16 MED ORDER — CIPROFLOXACIN HCL 500 MG PO TABS
500.0000 mg | ORAL_TABLET | Freq: Once | ORAL | Status: AC
  Administered 2015-11-16: 500 mg via ORAL
  Filled 2015-11-16: qty 1

## 2015-11-16 MED ORDER — CIPROFLOXACIN HCL 500 MG PO TABS: 500.0000 mg | ORAL_TABLET | Freq: Two times a day (BID) | ORAL | Status: DC

## 2015-11-16 NOTE — ED Notes (Signed)
Per EMS pt recently dx w/ UTI.  Pt has called EMS twice today and once yesterday.  D/c'd from [facility] this AM.  Pt is A&O x 4.  C/o lower back pain and nausea.

## 2015-11-16 NOTE — ED Notes (Signed)
Pt verbalized understanding of d/c instructions and has no further questions. Pt stable and NAD. Pt educated on new antibiotic for UTI.

## 2015-11-16 NOTE — ED Provider Notes (Addendum)
CSN: [id number]     Arrival date & time 11/16/15  0101 History  By signing my name below, I, [name], attest that this documentation has been prepared under the direction and in the presence of Gwyneth Sprout, MD.   Electronically Signed: Iona Fowler, ED Scribe. 11/16/2015. 3:56 AM     Chief Complaint  Patient presents with  . Weakness    The history is provided by the patient. No language interpreter was used.   HPI Comments: [name] is a 65 y.o. female with PMHx of CAD, stroke, HTN, and HLD who presents to the Emergency Department via EMS complaining of gradual onset, constant, weakness, onset earlier today around 3 pm, worsening tonight prompting her to call an ambulance. Pt blood pressure was 90/40 on the way to the hospital, via EMS. She reports associated dizziness, mild headache and says she feels as if she is off balance. Pt states that she was recently had several bouts of low blood pressure and was recently admitted to the hospital where doctors noted that she was experiencing hypotension. No worsening or alleviating factors were noted. Pt denies chest pain, abdominal pain, shortness of breath, swelling or pain in legs, or any other pertinent symptoms.   Past Medical History  Diagnosis Date  . Coronary artery disease     non obstructive  . Seizures (HCC)   . Anxiety   . Depression   . Migraine   . Fibromuscular dysplasia (HCC)   . Tobacco abuse   . COPD (chronic obstructive pulmonary disease) (HCC)   . Arthritis of knee     Bilateral  . Tobacco abuse   . Migraine headache   . Anxiety disorder   . Depression   . Seizure disorder (HCC)   . Cerebrovascular disease   . CAD (coronary artery disease)   . Automatic implantable cardiac defibrillator in situ     [employer] ICD  . HTN (hypertension)   . HLD (hyperlipidemia)   . Ventricular fibrillation (HCC)   . Long QT syndrome   . Cardiac arrest (HCC)   . Atrial fibrillation (HCC)    a) 2D echo 09/11/12: LVEF 55-60%, mild LVH, mld LA dilatation  . Allergy   . Stroke [facility])     no deficits   Past Surgical History  Procedure Laterality Date  . Pacemaker insertion  05/01/2003    ICD, implantation of a Guidant single chamber defibrillator, Doylene Canning. Ladona Ridgel MD  . Cholecystectomy    . Tubal ligation    . Breast lumpectomy    . Appendectomy    . Cosmetic surgery    . Cardiac defibrillator placement     Family History  Problem Relation Age of Onset  . Dementia Mother   . Stroke Mother     Multiple  . Aneurysm Sister     Brain  . Stroke Sister   . Uterine cancer Other     Grandmother  . Colon cancer Sister   . Cancer Sister    Social History  Substance Use Topics  . Smoking status: Current Every Day Smoker -- 0.25 packs/day for 40 years    Types: Cigarettes  . Smokeless tobacco: Never Used     Comment: almost a pack a week  . Alcohol Use: No   OB History    No data available     Review of Systems A complete 10 system review of systems was obtained and all systems are negative except as noted in the HPI and  PMH.    Allergies  Azithromycin; Ibuprofen; Codeine; and Demerol  Home Medications   Prior to Admission medications   Medication Sig Start Date End Date Taking? Authorizing Provider  acetaminophen (TYLENOL) 500 MG tablet Take 1,000 mg by mouth every 6 (six) hours as needed for headache (pain).    Yes Historical Provider, MD  albuterol (PROVENTIL HFA;VENTOLIN HFA) 108 (90 BASE) MCG/ACT inhaler Inhale 2 puffs into the lungs every 6 (six) hours as needed for wheezing. Reported on 10/31/2015   Yes Historical Provider, MD  diazepam (VALIUM) 5 MG tablet Take 5 mg by mouth 3 (three) times daily. 10/25/15  Yes Historical Provider, MD  diphenhydrAMINE (BENADRYL) 25 MG tablet Take 50 mg by mouth every 6 (six) hours as needed for allergies or sleep.    Yes Historical Provider, MD  metoprolol tartrate (LOPRESSOR) 25 MG tablet Take 25 mg by mouth 2 (two) times  daily.   Yes Historical Provider, MD  mirtazapine (REMERON) 15 MG tablet Take 15-30 mg by mouth at bedtime as needed (sleep). Reported on 10/31/2015 10/20/14  Yes Historical Provider, MD  oxymetazoline (AFRIN) 0.05 % nasal spray Place 2 sprays into both nostrils 2 (two) times daily as needed for congestion. Reported on 10/31/2015   Yes Historical Provider, MD  Rivaroxaban (XARELTO) 15 MG TABS tablet Take 1 tablet (15 mg total) by mouth at bedtime. 07/25/15  Yes Iran Ouch, MD  sertraline (ZOLOFT) 100 MG tablet Take 200 mg by mouth daily.    Yes Historical Provider, MD  simvastatin (ZOCOR) 20 MG tablet Take 1 tablet (20 mg total) by mouth at bedtime. 06/06/15  Yes Iran Ouch, MD  cephALEXin (KEFLEX) 500 MG capsule Take 1 capsule (500 mg total) by mouth 4 (four) times daily. Patient not taking: Reported on 11/16/2015 11/04/15   Kristen N Ward, DO   BP 115/75 mmHg  Pulse 66  Temp(Src) 97.8 F (36.6 C) (Oral)  Resp 16  Ht  (1.626 m)  Wt 174 lb (78.926 kg)  BMI 29.85 kg/m2  SpO2 100% Physical Exam  Constitutional: She is oriented to person, place, and time. She appears well-developed and well-nourished. No distress.  HENT:  Head: Normocephalic and atraumatic.  Eyes: EOM are normal.  Neck: Normal range of motion.  Cardiovascular: Normal rate and normal heart sounds.  An irregularly irregular rhythm present.  Pulmonary/Chest: Effort normal and breath sounds normal.  Abdominal: Soft. She exhibits no distension. There is no tenderness.  Musculoskeletal: Normal range of motion.  Neurological: She is alert and oriented to person, place, and time.  Skin: Skin is warm and dry.  Psychiatric: She has a normal mood and affect. Judgment normal.  Nursing note and vitals reviewed.   ED Course  Procedures (including critical care time) DIAGNOSTIC STUDIES: Oxygen Saturation is 100% on RA, normal by my interpretation.    COORDINATION OF CARE: 1:42 AM-Discussed treatment plan which includes  DG chest, CBC, CMP, EKG, and urinalysis with pt at bedside and pt agreed to plan.     Labs Review Labs Reviewed  COMPREHENSIVE METABOLIC PANEL - Abnormal; Notable for the following:    Glucose, Bld 100 (*)    BUN 27 (*)    Creatinine, Ser 1.60 (*)    Total Protein 6.2 (*)    Albumin 3.4 (*)    ALT 12 (*)    GFR calc non Af Amer 33 (*)    GFR calc Af Amer 38 (*)    All other components within normal limits  URINALYSIS, ROUTINE W REFLEX MICROSCOPIC (NOT AT [facility]) - Abnormal; Notable for the following:    Hgb urine dipstick TRACE (*)    Leukocytes, UA MODERATE (*)    All other components within normal limits  URINE MICROSCOPIC-ADD ON - Abnormal; Notable for the following:    Squamous Epithelial / LPF 0-5 (*)    Bacteria, UA RARE (*)    All other components within normal limits  URINE CULTURE  CBC WITH DIFFERENTIAL/PLATELET  I-STAT TROPOININ, ED  I-STAT CG4 LACTIC ACID, ED    Imaging Review Dg Chest 2 View  11/16/2015  CLINICAL DATA:  Weakness today. EXAM: CHEST  2 VIEW COMPARISON:  10/23/2015 FINDINGS: Single lead left-sided pacemaker remains in place. Cardiomediastinal contours are unchanged with atherosclerosis. Chronic bronchitic change is stable. No consolidation, pleural effusion or pneumothorax. No pulmonary edema. Stable osseous structures. IMPRESSION: No acute pulmonary process. Electronically Signed   By: Rubye Oaks M.D.   On: 11/16/2015 02:57   I have personally reviewed and evaluated these images and lab results as part of my medical decision-making.   EKG Interpretation   Date/Time:  Friday November 16 2015 02:16:14 EST Ventricular Rate:  61 PR Interval:  242 QRS Duration: 112 QT Interval:  443 QTC Calculation: 446 R Axis:   47 Text Interpretation:  Sinus rhythm Prolonged PR interval Borderline  intraventricular conduction delay Borderline low voltage, extremity leads  Nonspecific T abnormalities, anterior leads No significant change since  last tracing  Confirmed by Anitra Lauth  MD, Alphonzo Lemmings ([id number]) on 11/16/2015  3:23:51 AM      MDM   Final diagnoses:  Generalized weakness  Orthostatic hypotension  UTI (lower urinary tract infection)   Patient presents today complaining of generalized weakness that started around 3 PM today and worsened. She feels worse with getting up and walking around but denies any nausea or vomiting currently. She was given Zofran in the ambulance which helped with nausea. Patient is sitting in no acute distress text staying on her phone. She recently had been hospitalized for hypoglycemia and UTI. She was then seen approximately 1-1/2 weeks ago for similar symptoms but at that time was found to be hypoglycemic. When EMS arrived tonight she was hypotensive at 90/40 which improved with lying down. Here patient's blood pressure has been within normal limits.  She recently finished an antibiotic for UTI.   Here patient appears slightly dehydrated and states she does not drink enough fluid at home but has been eating. She has no abdominal pain and low suspicion that patient's symptoms are related to ACS. Her EKG is unchanged and given her symptoms of been going on since 3 PM would expect an elevated troponin if it were ACS and her troponin is normal. UA is concerning for recurrent UTI with 6-30 white blood cells and moderate leukocytes. A culture was sent. Lactic acid, troponin, CBC are all within normal limits. CMP with slight bump in her creatinine and chest x-ray within normal limits. Orthostatics show an increase in heart rate with standing by 20 beats but no significant blood pressure changes. After IV fluids patient was feeling better. She has had no strokelike symptoms and she was able to ambulate in the hallway without difficulty. At this time feel the patient will be safe for discharge home but will start her on Cipro. Encouraged her to increase her fluid intake and follow-up with her PCP.  I personally performed the services  described in this documentation, which was scribed in my presence.  The recorded  information has been reviewed and considered.      Gwyneth Sprout, MD 11/16/15 1610  Gwyneth Sprout, MD 11/16/15 [phone]

## 2015-11-16 NOTE — ED Notes (Addendum)
Per EMS, pt from home. Pt recently admitted for UTI and hypoglycemia. Pt just finished antibiotics. Pt has been feeling weakness since 1500 this afternoon with some slight abd pain. Pt had some dizziness and some nausea but resolved with zofran given by EMS. Initially on scene pt's BP was 90/40, then BP came up by itself without fluid administration. EMS VS BP 110/70, HR 68(12 lead showed a-fib which is normal for pt), SPO2 98%, CBG 127. Pt alert and oriented x 4.

## 2015-11-16 NOTE — ED Notes (Signed)
Ambulated Pt. Pt felt fine.

## 2015-11-17 LAB — URINE CULTURE

## 2015-11-20 ENCOUNTER — Other Ambulatory Visit: Payer: Self-pay | Admitting: *Deleted

## 2015-11-20 ENCOUNTER — Telehealth: Payer: Self-pay | Admitting: Cardiovascular Disease

## 2015-11-20 NOTE — Telephone Encounter (Signed)
Dr Kirke Corin is the pt's cardiologist.  I will forward this message to Dr Kirke Corin to review and give his response in regards to authorizing a prescription for Diazepam  three times a day.

## 2015-11-20 NOTE — Telephone Encounter (Signed)
°*  STAT* If patient is at the pharmacy, call can be transferred to refill team.   1. Which medications need to be refilled? (please list name of each medication and dose if known) Diazepam 5 mg-her doctor who prescribed this is out of town until 12-11-15-new prescription-She needs Dr Excell Seltzer to do this until her doctor gets back 2. Which pharmacy/location (including street and city if local pharmacy) is medication to be sent to?[phone]  3. Do they need a 30 day or 90 day supply?90

## 2015-11-20 NOTE — Patient Outreach (Signed)
S:  Pt had an ED visit on Friday, generalized weakness, not able to get up or complete ADLs. Tested + for UTI. Pt given Cipro Rx. Today she feels pretty good. She is able to get up and down, walk around the house.   Pt has poor support system. She has been telling me her son will take her to Dr. Dallas Schimke for her post hospitalization visit. She has been talking about moving to an ALF or group home. She says she is ready to make this move.  O:  BP 138/70 mmHg  Pulse 66  Resp 18  SpO2 98%       RRR       Clear       No edema  A:  Resolving UTI with change of antibiotic      Poor support needs placement  P:  Will let Lorin Picket Forrest know pt is ready for placement. I will do the FL2 form. We will work together to make this happen. Will also schedule an appt and see if [facility] can transport her.  I will remain in contact with pt during the process of placement.  [name] [facility] Care Manager [phone]

## 2015-11-21 ENCOUNTER — Other Ambulatory Visit: Payer: Self-pay | Admitting: Licensed Clinical Social Worker

## 2015-11-21 NOTE — Patient Outreach (Signed)
Assessment:  CSW called home phone number of client on 11/21/15.  CSW left phone message for client on 11/21/15 requesting that client please return call to CSW at [phone] to discuss current client needs.  This represents second time CSW has called client requesting return call from client. CSW also spoke with [name], geriatric nurse practitioner, on 11/21/15 about communication challenges with client and CSW and about client care needs.  CSW and Noralyn Pick spoke of level of care needs of client.  Client does have some family support. Son of client lives locally and has been helping client with transport needs of client to go to and from client medical appointments. Noralyn Pick said she will prepare FL-2 for client. CSW gave her the name of [facility] in [place] and also gave Noralyn Pick the phone contact number of Oneal Grout, owner of [facility]. Noralyn Pick said she would call Oneal Grout to talk with Oneal Grout about placement options for client at [facility] or to determine if there is a waiting list for placement at [facility].  Client does have a set amount of income each month and Oneal Grout would need to determine if client's income monthly was sufficient to cover cost of care for client at [facility]. Client did not return call to CSW on 11/21/15 as requested from CSW. This represents second time CSW has called client and requested return call to CSW by client and has not received return call from client.  Plan: Client to attend scheduled client medical appointments in next 30 days. CSW to call client in 2 weeks to assess client needs and to attempt again to communicate with client.  Kelton Pillar.Anabelen Kaminsky MSW, LCSW Licensed Clinical Social Worker [facility] Care Management [phone]

## 2015-11-21 NOTE — Telephone Encounter (Signed)
Tried to call patient, got a voicemail, but it was not the patient. Will try again tomorrow.

## 2015-11-21 NOTE — Telephone Encounter (Signed)
I can't refill that. We went though this before with her.

## 2015-11-22 NOTE — Telephone Encounter (Signed)
Tried to contact patient, no answer and voicemail states that you have reached [employer]. I did not leave a message.

## 2015-11-23 NOTE — Telephone Encounter (Signed)
I have attempted to reach patient again today with no success.

## 2015-11-27 ENCOUNTER — Other Ambulatory Visit: Payer: Self-pay | Admitting: *Deleted

## 2015-11-27 NOTE — Patient Outreach (Signed)
Pt has agreed to ALF placement, daughter [name] would [facility] [place]. They are going for a visit tomorrow. I have completed the FL2, faxing a copy to Dr. Dallas Schimkeopeland for her signature. I will ask Lorna FewScott Forrest, LCSW, to complete the PASSAR. I will deliver the preliminary FL2 this evening to the facility.  Brittney LovelyCarroll Marge Vandermeulen [facility] Hospital[facility]NP-BC [name] [phone]

## 2015-11-30 ENCOUNTER — Other Ambulatory Visit: Payer: Self-pay | Admitting: Cardiovascular Disease

## 2015-11-30 ENCOUNTER — Telehealth: Payer: Self-pay | Admitting: *Deleted

## 2015-11-30 NOTE — Telephone Encounter (Signed)
[facility] Patient Outreach      Brittney Lovelyarroll Spinks, NP at 11/27/2015 1:32 PM     Status: Signed       Expand All Collapse All   Pt has agreed to ALF placement, daughter [name] would like her to [facility] [place]. They are going for a visit tomorrow. I have completed the FL2, faxing a copy to Dr. Dallas Schimkeopeland for her signature. I will ask Lorna FewScott Forrest, LCSW, to complete the PASSAR. I will deliver the preliminary FL2 this evening to the facility.  Brittney LovelyCarroll Spinks [facility]NP-BC [facility] Care Manager [phone]       Received FL-2 Form [copy & pasted note RE: fax received on 11/29/15]; will forwarded to provider's folder to be signed upon her return to office on Mon, 13, 2017/SLS 03/10

## 2015-11-30 NOTE — Telephone Encounter (Signed)
°*  STAT* If patient is at the pharmacy, call can be transferred to refill team.   1. Which medications need to be refilled? (please list name of each medication and dose if known) Metoprolol 25 mg-Changed insurance-please call today,she wont have enough for the weekend please  2. Which pharmacy/location (including street and city if local pharmacy) is medication to be sent to?[phone]  3. Do they need a 30 day or 90 day supply? 90 and refills

## 2015-11-30 NOTE — Telephone Encounter (Signed)
Tried to call patient about rx. Rx was sent to pharmacy with 1 year of refills Last August. When I asked [facility] they stated patient transferred rx out and has not received rx from them.

## 2015-12-01 ENCOUNTER — Telehealth: Payer: Self-pay | Admitting: Physician Assistant

## 2015-12-01 DIAGNOSIS — I4891 Unspecified atrial fibrillation: Secondary | ICD-10-CM

## 2015-12-01 MED ORDER — METOPROLOL TARTRATE 25 MG PO TABS: 25.0000 mg | ORAL_TABLET | Freq: Two times a day (BID) | ORAL | Status: DC

## 2015-12-01 NOTE — Telephone Encounter (Signed)
Pt out of metoprolol, all of her refills are on the mail order prescription which she does not have time for. [facility]  Leanna BattlesBarrett, Rhonda, PA-C 12/01/2015 1:45 PM Beeper [phone]

## 2015-12-03 ENCOUNTER — Other Ambulatory Visit: Payer: Self-pay | Admitting: *Deleted

## 2015-12-03 NOTE — Patient Outreach (Signed)
Called pt to follow up on plans for [facility]. She says she has put down a deposit and plans on moving over there one day this week. I asked her if I could schedule and appointment with Dr. Dallas Schimkeopeland for her for the next week and she consented. I also asked if I could visit her the next week and she agrees.  She reports she is doing well no exacerbated problems and no new problems.  Called [facility] and scheduled pt for an appointment with Dr. Dallas Schimkeopeland for Wednesday March 22 at 11:30 am. Advised pt of this appointment.  I will see pt the following Wednesday at 10:00 am at there new residence.  Almetta LovelyCarroll Jacklynn Fowler [facility]NP-BC [facility] Care Manager [phone]

## 2015-12-04 ENCOUNTER — Other Ambulatory Visit: Payer: Self-pay | Admitting: *Deleted

## 2015-12-04 NOTE — Patient Outreach (Addendum)
S:  Pt called me this morning complaining of abdominal pain and "feeling like she has a block in it." I noted that she also sounded like she had sinus congestion. She also reported that her pulse felt thready.  I came to pt home and she is now feeling much better. The abdominal pain is gone and she does not feel like her sinuses are stopped up.  O:  BP 144/100 mmHg  Pulse 72  Resp 16  SpO2 96%       RRR       Lungs clear       Abdomen with very sluggish bowel sounds. Soft and nontender.  A:  Resolved abdominal pain and sinus congestion      HTN      COPD and continues to smoke  P:  Ensure a BM every 3 days.       May take plain mucinex generic for congestion - avoid decongestant meds related to HTN.       Suggest pt stop smoking when she moves! (She says she cannot smoke inside there.)       Pt to advise me when she moves to new location-supposedly this week.       Pt to see Dr. Edilia Bo next week.  [facility] CM Care Plan Problem One        Most Recent Value   Care Plan Problem One  Client needs to attend scheduled medical appointments   Role Documenting the Problem One  Clinical Social Worker   Care Plan for Problem One  Active   THN CM Short Term Goal #1 (0-30 days)  Client will attend scheduled medical appointmetns for client in next 30 days   THN CM Short Term Goal #1 Start Date  10/26/15   [facility] CM Short Term Goal #1 Met Date  -- [name] is not met.]   Interventions for Short Term Goal #1  Scheduled appt for 3/22 at 11:30(MD has changed practices)   THN CM Short Term Goal #2 (0-30 days)  Client wil attend scheduled medical appointments for client in next 30 days   THN CM Short Term Goal #2 Start Date  11/21/15   Interventions for Short Term Goal #2  CSW has encouraged son, daughter of client to help client attend scheduled client medical appointments in next 30 days.     THN CM Care Plan Problem Two        Most Recent Value   Care Plan Problem Two  Pt needs more personal assistance.    Role Documenting the Problem Two  Care Management Coordinator   Care Plan for Problem Two  Active   THN CM Short Term Goal #1 Start Date  12/04/15   Interventions for Short Term Goal #2   completed FL2 form and sent to MD for signature to forward to complex.    THN CM Care Plan Problem Three        Most Recent Value   Care Plan Problem Three  High risk for falls.   Role Documenting the Problem Three  Care Management Coordinator   Care Plan for Problem Three  Active   THN Long Term Goal (31-90) days  Pt will not sustain injury from fall in next 90 days.   Interventions for Problem Three Long Term Goal  Discussed fall prevention and provided safety information.      Deloria Lair [facility] [phone]

## 2015-12-05 ENCOUNTER — Other Ambulatory Visit: Payer: Self-pay | Admitting: Licensed Clinical Social Worker

## 2015-12-05 NOTE — Patient Outreach (Signed)
Assessment:  CSW called client home phone number on 12/05/15.  CSW left phone message for [name] on 12/05/15 requesting that [name] please return call to CSW at [phone] to discuss client needs. [name], geriatric nurse practitioner, has been working with client related to FL-2 completion of client.  [name] has also been speaking with client about placement options for client. CSW has had difficulty communicating with client. CSW has called client number numerous times with no answer. Client has called CSW on a restricted line but CSW was not able to answer call when client made call.  Client return number was not listed on call.  CSW has talked with [name] about CSW difficulty in communicating with client. [name] gave CSW home number of client. CSW is using that home number and trying to leave client messages on that home phone number. CSW spoke on 12/05/15 with [name] regarding client needs. [name] reported to CSW on 12/05/15 that client was in the process of preparing to [facility] [place], [place]. Client is hoping to be able to move to those apartments within the next weeks (per [name]).  Client family is in agreement to client move to those apartments. [name] said client would be able to receive two meals daily as part of living at that apartment complex.  CSW has not received return call from client on 12/05/15. CSW will continue to communicate with [name] regarding client status.  Plan: Client to attend all scheduled client medical appointments in next 30 days. CSW to call client in two weeks to assess client status   [name] MSW, LCSW Licensed Clinical Social Worker [facility] Care Management [phone]

## 2015-12-10 ENCOUNTER — Other Ambulatory Visit: Payer: Self-pay | Admitting: *Deleted

## 2015-12-10 ENCOUNTER — Ambulatory Visit: Payer: Self-pay | Admitting: *Deleted

## 2015-12-10 NOTE — Patient Outreach (Signed)
Pt returned my call. She has not moved yet. She is waiting on her son to move her. She still thinks he will do so. I asked her how she is getting to her appointments this week and she says her son or a friend of hers. I emphasized how important it is for her to make these appointments. She agrees. I will follow up with her at the end of the week to see if she has moved and went to her appointments.  [name] [facility]NP-BC [facility] Care Manager [phone]

## 2015-12-12 ENCOUNTER — Ambulatory Visit: Payer: Medicare Other | Admitting: Family Medicine

## 2015-12-12 ENCOUNTER — Ambulatory Visit (INDEPENDENT_AMBULATORY_CARE_PROVIDER_SITE_OTHER): Payer: Medicare Other | Admitting: Family Medicine

## 2015-12-12 VITALS — BP 110/66 | HR 72 | Temp 98.7°F | Resp 18 | Ht 64.0 in | Wt 168.8 lb

## 2015-12-12 DIAGNOSIS — I4891 Unspecified atrial fibrillation: Secondary | ICD-10-CM | POA: Diagnosis not present

## 2015-12-12 DIAGNOSIS — F17200 Nicotine dependence, unspecified, uncomplicated: Secondary | ICD-10-CM | POA: Diagnosis not present

## 2015-12-12 DIAGNOSIS — R748 Abnormal levels of other serum enzymes: Secondary | ICD-10-CM | POA: Diagnosis not present

## 2015-12-12 DIAGNOSIS — F329 Major depressive disorder, single episode, unspecified: Secondary | ICD-10-CM

## 2015-12-12 DIAGNOSIS — M25561 Pain in right knee: Secondary | ICD-10-CM | POA: Diagnosis not present

## 2015-12-12 DIAGNOSIS — N3090 Cystitis, unspecified without hematuria: Secondary | ICD-10-CM | POA: Diagnosis not present

## 2015-12-12 DIAGNOSIS — F32A Depression, unspecified: Secondary | ICD-10-CM

## 2015-12-12 DIAGNOSIS — M25562 Pain in left knee: Secondary | ICD-10-CM | POA: Diagnosis not present

## 2015-12-12 LAB — COMPREHENSIVE METABOLIC PANEL
ALBUMIN: 4.3 g/dL (ref 3.6–5.1)
ALK PHOS: 79 U/L (ref 33–130)
ALT: 10 U/L (ref 6–29)
AST: 15 U/L (ref 10–35)
BILIRUBIN TOTAL: 0.3 mg/dL (ref 0.2–1.2)
BUN: 20 mg/dL (ref 7–25)
CALCIUM: 9.8 mg/dL (ref 8.6–10.4)
CO2: 25 mmol/L (ref 20–31)
Chloride: 100 mmol/L (ref 98–110)
Creat: 1.44 mg/dL — ABNORMAL HIGH (ref 0.50–0.99)
Glucose, Bld: 93 mg/dL (ref 65–99)
POTASSIUM: 3.9 mmol/L (ref 3.5–5.3)
Sodium: 136 mmol/L (ref 135–146)
Total Protein: 7.5 g/dL (ref 6.1–8.1)

## 2015-12-12 LAB — POCT URINALYSIS DIP (MANUAL ENTRY)
Bilirubin, UA: NEGATIVE
Glucose, UA: NEGATIVE
Ketones, POC UA: NEGATIVE
Leukocytes, UA: NEGATIVE
NITRITE UA: NEGATIVE
PH UA: 5.5
RBC UA: NEGATIVE
Spec Grav, UA: 1.02
UROBILINOGEN UA: 0.2

## 2015-12-12 LAB — POC MICROSCOPIC URINALYSIS (UMFC): Mucus: ABSENT

## 2015-12-12 NOTE — Progress Notes (Signed)
Subjective:    Patient ID: [name], female    DOB: 12/06/1950, 65 y.o.   MRN: [id number]  HPI This is a 65 yo female who presents today for follow up of hospitalization for UTI/sinus infection/dehydration last month. She was treated with fluids and antibiotics.   She has bilateral knee pain and is interested in having a knee replacement. She is requesting a referral. She saw someone at [facility] many years ago (20-30) and was unhappy with her care. She continues to have pain, does not take any medications. She is limited by her mobility and has difficulty walking long periods of time.   She will be moving at the end of the week. She will be moving into a retirement apartment. She is able to take her dog. She sees Dr. Shawnee KnappShugart for her depression. She has an appointment for follow up tomorrow. She has been weaning herself off her cigarettes and is down to 4 a day.   Afib on Xarelto, metoprolol. Rarely feels palpitatons. Has an AED for past sudden death episode. Sees Dr. Kirke CorinArida.   She sees psychiatry monthly and cardiology every 3 months. She gets all of her meds from them.   Past Medical History  Diagnosis Date  . Coronary artery disease     non obstructive  . Seizures (HCC)   . Anxiety   . Depression   . Migraine   . Fibromuscular dysplasia (HCC)   . Tobacco abuse   . COPD (chronic obstructive pulmonary disease) (HCC)   . Arthritis of knee     Bilateral  . Tobacco abuse   . Migraine headache   . Anxiety disorder   . Depression   . Seizure disorder (HCC)   . Cerebrovascular disease   . CAD (coronary artery disease)   . Automatic implantable cardiac defibrillator in situ     [employer] ICD  . HTN (hypertension)   . HLD (hyperlipidemia)   . Ventricular fibrillation (HCC)   . Long QT syndrome   . Cardiac arrest (HCC)   . Atrial fibrillation (HCC)     a) 2D echo 09/11/12: LVEF 55-60%, mild LVH, mld LA dilatation  . Allergy   . Stroke [facility](HCC)     no  deficits   Past Surgical History  Procedure Laterality Date  . Pacemaker insertion  05/01/2003    ICD, implantation of a Guidant single chamber defibrillator, Doylene CanningGregg W. Ladona Ridgelaylor MD  . Cholecystectomy    . Tubal ligation    . Breast lumpectomy    . Appendectomy    . Cosmetic surgery    . Cardiac defibrillator placement     Family History  Problem Relation Age of Onset  . Dementia Mother   . Stroke Mother     Multiple  . Aneurysm Sister     Brain  . Stroke Sister   . Uterine cancer Other     Grandmother  . Colon cancer Sister   . Cancer Sister    Social History  Substance Use Topics  . Smoking status: Current Every Day Smoker -- 0.25 packs/day for 40 years    Types: Cigarettes  . Smokeless tobacco: Never Used     Comment: almost a pack a week  . Alcohol Use: No      Review of Systems  Constitutional: Positive for fatigue. Negative for fever and appetite change.  HENT: Positive for congestion and sinus pressure.   Respiratory: Positive for shortness of breath (with over exertion). Negative for cough.  Cardiovascular: Negative for chest pain and leg swelling.  Gastrointestinal: Positive for diarrhea (loose stool, irritable bowel, takes immodium).  Allergic/Immunologic: Positive for environmental allergies (uses benadryl, saline nasal spray).  Neurological: Positive for headaches (chronic).       Objective:   Physical Exam  Constitutional: She appears well-developed and well-nourished. No distress.  HENT:  Head: Normocephalic and atraumatic.  Eyes: Conjunctivae are normal.  Neck: Normal range of motion. Neck supple.  Cardiovascular: Normal rate, regular rhythm and normal heart sounds.   Pulmonary/Chest: Effort normal and breath sounds normal.  Musculoskeletal: Normal range of motion.  Skin: She is not diaphoretic.  Vitals reviewed.     BP 110/66 mmHg  Pulse 72  Temp(Src) 98.7 F (37.1 C) (Oral)  Resp 18  Ht  (1.626 m)  Wt 168 lb 12.8 oz (76.567 kg)   BMI 28.96 kg/m2  SpO2 94% Wt Readings from Last 3 Encounters:  12/12/15 168 lb 12.8 oz (76.567 kg)  11/16/15 174 lb (78.926 kg)  11/04/15 171 lb (77.565 kg)       Assessment & Plan:  1. Cystitis - POCT urinalysis dipstick - POCT Microscopic Urinalysis ([facility])  2. Depression - continued follow up with psychiatry.  - discussed upcoming stress with moving, pros/cons, self care  3. Tobacco use disorder - encouraged her to continue to decrease cigarette use, consider picking stop smoking date  4. Atrial fibrillation, unspecified - rate controlled, continue cardiology follow up  5. Elevated creatine kinase - Comprehensive metabolic panel  6. Bilateral knee pain - encouraged ROM, walking as tolerated - Ambulatory referral to Orthopedic Surgery  - f/u 3 months Olean Ree, FNP-BC  [facility]  12/12/2015 1:30 PM

## 2015-12-12 NOTE — Patient Instructions (Addendum)
Can try long acting antihistamine like zyrtec or claritin (generic is fine) Please return in 3 months Please schedule a mammogram after you move  We recommend that you schedule a mammogram for breast cancer screening. Typically, you do not need a referral to do this. Please contact a local imaging center to schedule your mammogram.  [facility] - [phone] or [phone]  [facility] - [phone] - [phone] [facility] - [phone]  *ask for the Radiology Department [facility] - [phone]  *ask for the Radiology Department [facility] - [phone]  *ask for the Mammography Department [facility] - [phone]

## 2015-12-14 ENCOUNTER — Other Ambulatory Visit: Payer: Self-pay | Admitting: *Deleted

## 2015-12-18 ENCOUNTER — Other Ambulatory Visit: Payer: Self-pay | Admitting: *Deleted

## 2015-12-19 ENCOUNTER — Other Ambulatory Visit: Payer: Self-pay | Admitting: Licensed Clinical Social Worker

## 2015-12-19 ENCOUNTER — Other Ambulatory Visit: Payer: Self-pay | Admitting: *Deleted

## 2015-12-19 ENCOUNTER — Encounter: Payer: Self-pay | Admitting: Licensed Clinical Social Worker

## 2015-12-19 NOTE — Patient Outreach (Addendum)
[facility]) Care Management  12/19/2015  Brittney ChessKatrina M Fowler 12/14/1950 [id number]   Routine home visit.  S:  Pt is now moved to her new apartment at [address] [facility] in [place]. She is quite happy and so is her dog! She is looking forward to participating in activities, making new friends, dog friends for Bre[place]. She says she will probably change primary providers to a practice near this location for convenience.  Pt does not have an albuterol inhaler, she says she will get it when she gets paid. She is continuing to smoke but she says only about a pack per week.  She cannot smoke in her apartment.  O:  BP 136/96 mmHg  Pulse 74  Resp 18  Wt 164 lb (74.39 kg)  SpO2 96%        RRR       Lungs clear  A:  COPD       Tobacco Use        OA of knees   P:  I will plan on graduating pt on my next home visit in April.      Encouraged her to keep her meds up to date and in supply.      Provided the name of Sherryl MangesSteven Klein, Internist, in [place].  Almetta LovelyCarroll Jorden Mahl [facility]-BC [facility] Care Manager [phone]

## 2015-12-19 NOTE — Patient Outreach (Signed)
Assessment:  CSW received phone call from [name], geriatric nurse practitioner, on 12/19/15. [name] reported to CSW that client had just moved to new residence location in [place], [place].  Client is now residing at [address], in [place].  [name] informed CSW that client had a new phone number of [phone].  [name] said she had visited with client for a routine home visit with client on 12/19/15. [name] reported that client was pleased with new residence. [name] reported that client was looking forward to meeting new neighbors/ new friends at apartment complex.  CSW called phone contact number for client on 12/19/15. CSW spoke via phone with client on 12/19/15.  CSW verified client identity. CSW and client spoke of client needs. CSW updated client assessment information.  Client said she has difficulty in walking.  She uses cane to help her walk. She has pain in right knee.  She was hospitalized in February of 2017 for bladder infection. She said she has been taking prescribed antibiotics for bladder infection. She said she is feeling better.  She said she has family support from her son and daughter.  CSW and client spoke of client care plan. CSW encouraged client to attend all scheduled medical appointments in the next 30 days.  She said she has been going to [facility] in [place], [place] for about 3 years for mental health support.  She said she meets as scheduled with psychiatrist at [facility]. She said psychiatrist ( Dr. Francee Fowler) helps in managing client's prescribed medications for mental health needs. Client said she is scheduled for another mental health appointment with Dr. Francee Fowler in April of 2017.  Client also said she has some challenges with anxiety and has talked with Dr. Francee Fowler about anxiety symptoms of client..  Client also has some financial challenges. She is talking with her adult children  about planning for monthly bill payments for client.  She said she has her prescribed medications and is taking medications as prescribed. Client and CSW spoke of client medical appointments. Client is considering her medical care options since she is now in a new residence. She said that [facility] a facility [place] and she hopes to be able to get transport help to and from her appointments via this van.   CSW thanked [name] for phone conversation with CSW on 12/19/15.  CSW encouraged [name] to call CSW at [phone] as needed to discuss social work needs of client.    Plan: Client will attend all scheduled client medical appointments in the next 30 days. CSW to collaborate with [name], geriatric nurse practitioner, in monitoring needs of client.  CSW to call client in two weeks to assess client needs.  [name] MSW, LCSW Licensed Clinical Social Worker [facility] Care Management [phone]

## 2016-01-01 ENCOUNTER — Other Ambulatory Visit: Payer: Self-pay | Admitting: Licensed Clinical Social Worker

## 2016-01-01 DIAGNOSIS — Z7901 Long term (current) use of anticoagulants: Secondary | ICD-10-CM | POA: Diagnosis not present

## 2016-01-01 DIAGNOSIS — Z79899 Other long term (current) drug therapy: Secondary | ICD-10-CM | POA: Diagnosis not present

## 2016-01-01 DIAGNOSIS — R9431 Abnormal electrocardiogram [ECG] [EKG]: Secondary | ICD-10-CM | POA: Diagnosis not present

## 2016-01-01 DIAGNOSIS — Z885 Allergy status to narcotic agent status: Secondary | ICD-10-CM | POA: Diagnosis not present

## 2016-01-01 DIAGNOSIS — I10 Essential (primary) hypertension: Secondary | ICD-10-CM | POA: Diagnosis not present

## 2016-01-01 DIAGNOSIS — E785 Hyperlipidemia, unspecified: Secondary | ICD-10-CM | POA: Diagnosis not present

## 2016-01-01 DIAGNOSIS — Z95810 Presence of automatic (implantable) cardiac defibrillator: Secondary | ICD-10-CM | POA: Diagnosis not present

## 2016-01-01 DIAGNOSIS — I482 Chronic atrial fibrillation: Secondary | ICD-10-CM | POA: Diagnosis not present

## 2016-01-01 DIAGNOSIS — R110 Nausea: Secondary | ICD-10-CM | POA: Diagnosis not present

## 2016-01-01 DIAGNOSIS — R0602 Shortness of breath: Secondary | ICD-10-CM | POA: Diagnosis not present

## 2016-01-01 DIAGNOSIS — Z881 Allergy status to other antibiotic agents status: Secondary | ICD-10-CM | POA: Diagnosis not present

## 2016-01-01 DIAGNOSIS — Z7951 Long term (current) use of inhaled steroids: Secondary | ICD-10-CM | POA: Diagnosis not present

## 2016-01-01 DIAGNOSIS — Z886 Allergy status to analgesic agent status: Secondary | ICD-10-CM | POA: Diagnosis not present

## 2016-01-01 NOTE — Patient Outreach (Signed)
Assessment:  CSW called phone contact number for client on 01/01/16 and spoke via phone with client. CSW verified client identity. CSW and client spoke of client needs. Client is now residing at [address] [facility] [address] [place], [place]. Client is enjoying her new residence and is trying to make new friends at her new residence location. She continues to go to scheduled appointments with Dr. Francee GentileShubert, psychiatrist, at [facility] in [place], [place]. She has her prescribed medications and is taking medications as prescribed.  She has some difficulty walking and uses a cane to help her walk.  [facility] support with Almetta Lovelyarroll Spinks, geriatric nurse practitioner. Client has family support from her son and from her daughter. Client is eating adequately and is sleeping adequately. Client sees Dr. Shanda BumpsJessica Copland as her primary care physician. CSW and client spoke of client care plan. CSW encouraged client to attend all scheduled client medical appointments in the next 30 days.  CSW encouraged Kamryn to call CSW at [phone] as needed to address social work needs of client. CSW thanked Gradie for phone conversation with CSW on 01/01/16.   Plan:  Client to attend all scheduled client medical appointments in the next 30 days. CSW to call client in two weeks to assess needs of client.  Kelton PillarMichael S.Adler Chartrand MSW, LCSW Licensed Clinical Social Worker [facility] Care Management [phone]

## 2016-01-08 ENCOUNTER — Other Ambulatory Visit: Payer: Self-pay | Admitting: *Deleted

## 2016-01-08 NOTE — Patient Outreach (Addendum)
[facility]) Care Management  01/08/2016  [name] 1950-10-05 [id number]   S:  Today is pt's discharge visit. She does report she had an evening where she went into AFIB and had to call 911. The squad was able to medically treat her and stop the irregularity. [name] is enjoying her new apartment and neighborhood. She does not have her inhaler which she told me she would get at the beginning of the month. Now she says she'll get it at the beginning of May.  O:  BP 140/90 mmHg  Pulse 78  Resp 18  Wt 164 lb (74.39 kg)  SpO2 96%       RRR - at present       Lungs are clear  A:  COPD (still smoking)      AFIB  P:  Encouraged pt to cont to try to reduce and or abstain from smoking.      Avoid caffeine.      Encouraged pt to take her meds as ordered.      Provided some names of local cardiologists and internal medicine specialist.      Will see if pharmcist can possible help pt with obtaining Dulera.      CSW to continue involvement, pt wants to apply for Medicaid. Advised pt she will have to gather             her financial information and go to Dept of Social Services in [place].  [facility] CM Care Plan Problem One        Most Recent Value   Care Plan Problem One  Client needs to attend scheduled medical appointments   Role Documenting the Problem One  Clinical Social Worker   Care Plan for Problem One  Active   [facility] CM Short Term Goal #1 (0-30 days)  Client will attend scheduled medical appointmetns for client in next 30 days   THN CM Short Term Goal #1 Start Date  10/26/15   [facility] CM Short Term Goal #1 Met Date  12/12/15 [name] is not met]   THN CM Short Term Goal #2 (0-30 days)  Client wil attend scheduled medical appointments for client in next 30 days   THN CM Short Term Goal #2 Start Date  11/21/15   [facility] CM Short Term Goal #2 Met Date  12/19/15   Interventions for Short Term Goal #2  Goal met. Encouraged pt to talk to staff about providers nearby  that have good reputation.   THN CM Short Term Goal #3 (0-30 days)  Client will attend all scheduled client medical appointments in the next 30 days   THN CM Short Term Goal #3 Start Date  12/19/15   Interventions for Short Tern Goal #3  Provided names, addresses and phone numbers for providers very close by.    [facility] CM Care Plan Problem Two        Most Recent Value   THN CM Short Term Goal #1 Start Date  12/04/15   [facility] CM Short Term Goal #1 Met Date   12/19/15    [facility] CM Care Plan Problem Three        Most Recent Value   Care Plan Problem Three  High risk for falls.   Role Documenting the Problem Three  Care Management Coordinator   Care Plan for Problem Three  Active   THN Long Term Goal (31-90) days  Pt will not sustain injury from fall in next 90  days.   THN Long Term Goal Start Date  12/05/15   Interventions for Problem Three Long Term Goal  Community case manager closing complex care managment case, CSW continues with pt. Encouraged pt to use safety precautions. Encouraged her to make orthopedic appt for knee evaluation.       [name] [phone]

## 2016-01-09 ENCOUNTER — Encounter: Payer: Self-pay | Admitting: *Deleted

## 2016-01-13 DIAGNOSIS — Z79899 Other long term (current) drug therapy: Secondary | ICD-10-CM | POA: Diagnosis not present

## 2016-01-13 DIAGNOSIS — I480 Paroxysmal atrial fibrillation: Secondary | ICD-10-CM | POA: Diagnosis not present

## 2016-01-13 DIAGNOSIS — Z883 Allergy status to other anti-infective agents status: Secondary | ICD-10-CM | POA: Diagnosis not present

## 2016-01-13 DIAGNOSIS — Z95810 Presence of automatic (implantable) cardiac defibrillator: Secondary | ICD-10-CM | POA: Diagnosis not present

## 2016-01-13 DIAGNOSIS — Z7951 Long term (current) use of inhaled steroids: Secondary | ICD-10-CM | POA: Diagnosis not present

## 2016-01-13 DIAGNOSIS — F17200 Nicotine dependence, unspecified, uncomplicated: Secondary | ICD-10-CM | POA: Diagnosis not present

## 2016-01-13 DIAGNOSIS — Z885 Allergy status to narcotic agent status: Secondary | ICD-10-CM | POA: Diagnosis not present

## 2016-01-13 DIAGNOSIS — E785 Hyperlipidemia, unspecified: Secondary | ICD-10-CM | POA: Diagnosis not present

## 2016-01-13 DIAGNOSIS — R112 Nausea with vomiting, unspecified: Secondary | ICD-10-CM | POA: Diagnosis not present

## 2016-01-13 DIAGNOSIS — R9431 Abnormal electrocardiogram [ECG] [EKG]: Secondary | ICD-10-CM | POA: Diagnosis not present

## 2016-01-13 DIAGNOSIS — I10 Essential (primary) hypertension: Secondary | ICD-10-CM | POA: Diagnosis not present

## 2016-01-13 DIAGNOSIS — Z886 Allergy status to analgesic agent status: Secondary | ICD-10-CM | POA: Diagnosis not present

## 2016-01-13 DIAGNOSIS — Z7901 Long term (current) use of anticoagulants: Secondary | ICD-10-CM | POA: Diagnosis not present

## 2016-01-13 DIAGNOSIS — R110 Nausea: Secondary | ICD-10-CM | POA: Diagnosis not present

## 2016-01-13 DIAGNOSIS — F419 Anxiety disorder, unspecified: Secondary | ICD-10-CM | POA: Diagnosis not present

## 2016-01-13 DIAGNOSIS — R0602 Shortness of breath: Secondary | ICD-10-CM | POA: Diagnosis not present

## 2016-01-14 ENCOUNTER — Other Ambulatory Visit: Payer: Self-pay | Admitting: Licensed Clinical Social Worker

## 2016-01-14 NOTE — Patient Outreach (Signed)
Assessment:  CSW spoke via phone with client on 01/14/16. CSW verified client identity. CSW and client spoke of client needs.  CSW and client spoke of client care plan. CSW encouraged client to attend all scheduled client medical appointments in the next 30 days.  CSW asked client about her mental health appointments at [facility] in [place], [place]. Client said she sees Dr. Shawnee KnappShugart, psychiatrist, with that agency.  She said she plans to see Dr. Shawnee KnappShugart this month for an appointment. Client said she has family support from her son and from her daughter. CSW and client spoke of process of applying for Medicaid. CSW reviewed with client financial documents she would need to collect to complete a Medicaid application at local Department of Social Services in [place], [place].  Client said she will talk more with her daughter about Medicaid application process for client. Client said she is eating well and sleeping well. She has family support. She said she enjoys her new apartment. She said she is making friends at [address] [place].  She said she had her medications and is taking medications as prescribed. She said she is considering changing some of her medical providers at this time. She continues, at present, to see Dr. Shanda BumpsJessica Copland as her primary doctor. She sees Dr. Shawnee KnappShugart as her psychiatrist.  Client asked for phone number for Almetta Lovelyarroll Spinks, geriatric nurse practitioner. Client said she had that phone number for Noralyn PickCarroll but had lost phone number. CSW provided Alison with [facility] number for Almetta LovelyCarroll Spinks [phone]). CSW thanked Illene for phone conversation with CSW on 01/14/16. Jaleeyah was appreciative of phone call from CSW on 01/14/16.   Plan:  Client to attend all scheduled client medical appointments in the next 30 days. CSW to call client in 3 weeks to assess needs of client at that time.   Kelton PillarMichael S.Wendel Homeyer MSW, LCSW Licensed Clinical Social Worker [facility] Care  Management [phone]

## 2016-01-29 ENCOUNTER — Other Ambulatory Visit: Payer: Self-pay | Admitting: *Deleted

## 2016-01-29 MED ORDER — RIVAROXABAN 15 MG PO TABS: 15.0000 mg | ORAL_TABLET | Freq: Every day | ORAL | Status: DC

## 2016-02-04 ENCOUNTER — Other Ambulatory Visit: Payer: Self-pay | Admitting: Licensed Clinical Social Worker

## 2016-02-04 NOTE — Patient Outreach (Signed)
Assessment:  CSW called client phone number several times on 02/04/16 but was not able to speak with client. CSW left two phone messages for client on 02/04/16 requesting client please return call to CSW at [phone] to discuss needs of client.  CSW is awaiting return call from client.  Plan:  If client does not call CSW in next 48 hours, then CSW will call client again next week. Client to attend all scheduled client medical appointments in next 30 days.  Kelton PillarMichael S.Erby Sanderson MSW, LCSW Licensed Clinical Social Worker [facility] Care Management [phone]

## 2016-02-07 NOTE — Telephone Encounter (Signed)
error 

## 2016-02-10 DIAGNOSIS — I4891 Unspecified atrial fibrillation: Secondary | ICD-10-CM | POA: Diagnosis not present

## 2016-02-10 DIAGNOSIS — Z886 Allergy status to analgesic agent status: Secondary | ICD-10-CM | POA: Diagnosis not present

## 2016-02-10 DIAGNOSIS — Z885 Allergy status to narcotic agent status: Secondary | ICD-10-CM | POA: Diagnosis not present

## 2016-02-10 DIAGNOSIS — Z7901 Long term (current) use of anticoagulants: Secondary | ICD-10-CM | POA: Diagnosis not present

## 2016-02-10 DIAGNOSIS — Z95810 Presence of automatic (implantable) cardiac defibrillator: Secondary | ICD-10-CM | POA: Diagnosis not present

## 2016-02-10 DIAGNOSIS — W57XXXA Bitten or stung by nonvenomous insect and other nonvenomous arthropods, initial encounter: Secondary | ICD-10-CM | POA: Diagnosis not present

## 2016-02-10 DIAGNOSIS — S0006XA Insect bite (nonvenomous) of scalp, initial encounter: Secondary | ICD-10-CM | POA: Diagnosis not present

## 2016-02-10 DIAGNOSIS — I10 Essential (primary) hypertension: Secondary | ICD-10-CM | POA: Diagnosis not present

## 2016-02-10 DIAGNOSIS — M179 Osteoarthritis of knee, unspecified: Secondary | ICD-10-CM | POA: Diagnosis not present

## 2016-02-10 DIAGNOSIS — Z79899 Other long term (current) drug therapy: Secondary | ICD-10-CM | POA: Diagnosis not present

## 2016-02-10 DIAGNOSIS — R404 Transient alteration of awareness: Secondary | ICD-10-CM | POA: Diagnosis not present

## 2016-02-10 DIAGNOSIS — Z7951 Long term (current) use of inhaled steroids: Secondary | ICD-10-CM | POA: Diagnosis not present

## 2016-02-10 DIAGNOSIS — Z881 Allergy status to other antibiotic agents status: Secondary | ICD-10-CM | POA: Diagnosis not present

## 2016-02-10 DIAGNOSIS — R531 Weakness: Secondary | ICD-10-CM | POA: Diagnosis not present

## 2016-02-10 DIAGNOSIS — E785 Hyperlipidemia, unspecified: Secondary | ICD-10-CM | POA: Diagnosis not present

## 2016-02-11 ENCOUNTER — Encounter: Payer: Self-pay | Admitting: Licensed Clinical Social Worker

## 2016-02-11 ENCOUNTER — Other Ambulatory Visit: Payer: Self-pay | Admitting: Licensed Clinical Social Worker

## 2016-02-11 NOTE — Patient Outreach (Signed)
Assessment:  CSW called phone contact number of client on 02/11/16. CSW spoke via phone with [name], daughter of client on 02/11/16. CSW verified identity of [name]. [name] and CSW spoke of client needs.  CSW informed [name] that [name] and client had spoken in recent phone calls about client's attending scheduled medical and mental health appointments for client. [name] said that client enjoyed residing at [facility] [address] in [place].  Client had previously been residing at a residence in [place], [place].  [place] informed [name] that client had met her care plan goal with CSW services. CSW thus informed [name] that [facility] would discharge client from [place] on 02/11/16 since client had met her care plan goal. [name], daughter of client, agreed to this plan.  CSW thanked [name] and client for client's participating in [facility] program support.  [name] was appreciative of [facility] program support for client in recent months.   Plan:  CSW is discharging [name] from [facility] CSW services on 02/11/16 since client has met her care plan goal with CSW services. CSW to inform Josepha Pigg that [facility] discharged client on 02/11/16. CSW to fax physician case closure letter to Dr. Lorelei Pont on 02/11/16 informing Dr. Lorelei Pont that [facility] discharged client from [facility] CSW services on 02/11/16.   Norva Riffle.Amrita Radu MSW, LCSW Licensed Clinical Social Worker [facility] Care Management [phone]

## 2016-02-14 DIAGNOSIS — Z888 Allergy status to other drugs, medicaments and biological substances status: Secondary | ICD-10-CM | POA: Diagnosis not present

## 2016-02-14 DIAGNOSIS — R1084 Generalized abdominal pain: Secondary | ICD-10-CM | POA: Diagnosis not present

## 2016-02-14 DIAGNOSIS — Z7901 Long term (current) use of anticoagulants: Secondary | ICD-10-CM | POA: Diagnosis not present

## 2016-02-14 DIAGNOSIS — Z883 Allergy status to other anti-infective agents status: Secondary | ICD-10-CM | POA: Diagnosis not present

## 2016-02-14 DIAGNOSIS — R079 Chest pain, unspecified: Secondary | ICD-10-CM | POA: Diagnosis not present

## 2016-02-14 DIAGNOSIS — Z886 Allergy status to analgesic agent status: Secondary | ICD-10-CM | POA: Diagnosis not present

## 2016-02-14 DIAGNOSIS — I480 Paroxysmal atrial fibrillation: Secondary | ICD-10-CM | POA: Diagnosis not present

## 2016-02-14 DIAGNOSIS — I10 Essential (primary) hypertension: Secondary | ICD-10-CM | POA: Diagnosis not present

## 2016-02-14 DIAGNOSIS — R300 Dysuria: Secondary | ICD-10-CM | POA: Diagnosis not present

## 2016-02-14 DIAGNOSIS — R1013 Epigastric pain: Secondary | ICD-10-CM | POA: Diagnosis not present

## 2016-02-14 DIAGNOSIS — E785 Hyperlipidemia, unspecified: Secondary | ICD-10-CM | POA: Diagnosis not present

## 2016-02-14 DIAGNOSIS — Z95810 Presence of automatic (implantable) cardiac defibrillator: Secondary | ICD-10-CM | POA: Diagnosis not present

## 2016-02-14 DIAGNOSIS — R1012 Left upper quadrant pain: Secondary | ICD-10-CM | POA: Diagnosis not present

## 2016-02-19 DIAGNOSIS — Z79899 Other long term (current) drug therapy: Secondary | ICD-10-CM | POA: Diagnosis not present

## 2016-02-19 DIAGNOSIS — R0789 Other chest pain: Secondary | ICD-10-CM | POA: Diagnosis not present

## 2016-02-19 DIAGNOSIS — R531 Weakness: Secondary | ICD-10-CM | POA: Diagnosis not present

## 2016-02-19 DIAGNOSIS — Z95810 Presence of automatic (implantable) cardiac defibrillator: Secondary | ICD-10-CM | POA: Diagnosis not present

## 2016-02-19 DIAGNOSIS — Z950 Presence of cardiac pacemaker: Secondary | ICD-10-CM | POA: Diagnosis not present

## 2016-02-19 DIAGNOSIS — K589 Irritable bowel syndrome without diarrhea: Secondary | ICD-10-CM | POA: Diagnosis not present

## 2016-02-19 DIAGNOSIS — F419 Anxiety disorder, unspecified: Secondary | ICD-10-CM | POA: Diagnosis not present

## 2016-02-19 DIAGNOSIS — R000 Tachycardia, unspecified: Secondary | ICD-10-CM | POA: Diagnosis not present

## 2016-02-19 DIAGNOSIS — M1710 Unilateral primary osteoarthritis, unspecified knee: Secondary | ICD-10-CM | POA: Diagnosis not present

## 2016-02-19 DIAGNOSIS — F1110 Opioid abuse, uncomplicated: Secondary | ICD-10-CM | POA: Diagnosis not present

## 2016-02-19 DIAGNOSIS — Z7901 Long term (current) use of anticoagulants: Secondary | ICD-10-CM | POA: Diagnosis not present

## 2016-02-19 DIAGNOSIS — I480 Paroxysmal atrial fibrillation: Secondary | ICD-10-CM | POA: Diagnosis not present

## 2016-02-19 DIAGNOSIS — I4891 Unspecified atrial fibrillation: Secondary | ICD-10-CM | POA: Diagnosis not present

## 2016-02-19 DIAGNOSIS — R079 Chest pain, unspecified: Secondary | ICD-10-CM | POA: Diagnosis not present

## 2016-02-19 DIAGNOSIS — R110 Nausea: Secondary | ICD-10-CM | POA: Diagnosis not present

## 2016-02-19 DIAGNOSIS — Z8674 Personal history of sudden cardiac arrest: Secondary | ICD-10-CM | POA: Diagnosis not present

## 2016-02-19 DIAGNOSIS — J449 Chronic obstructive pulmonary disease, unspecified: Secondary | ICD-10-CM | POA: Diagnosis not present

## 2016-02-20 DIAGNOSIS — R110 Nausea: Secondary | ICD-10-CM | POA: Diagnosis not present

## 2016-02-20 DIAGNOSIS — R079 Chest pain, unspecified: Secondary | ICD-10-CM | POA: Diagnosis not present

## 2016-02-20 DIAGNOSIS — J449 Chronic obstructive pulmonary disease, unspecified: Secondary | ICD-10-CM | POA: Diagnosis not present

## 2016-02-20 DIAGNOSIS — I480 Paroxysmal atrial fibrillation: Secondary | ICD-10-CM | POA: Diagnosis not present

## 2016-02-20 DIAGNOSIS — R531 Weakness: Secondary | ICD-10-CM | POA: Diagnosis not present

## 2016-02-20 DIAGNOSIS — Z7901 Long term (current) use of anticoagulants: Secondary | ICD-10-CM | POA: Diagnosis not present

## 2016-02-20 DIAGNOSIS — I4891 Unspecified atrial fibrillation: Secondary | ICD-10-CM | POA: Diagnosis not present

## 2016-03-14 DIAGNOSIS — N281 Cyst of kidney, acquired: Secondary | ICD-10-CM | POA: Diagnosis not present

## 2016-03-14 DIAGNOSIS — Z7901 Long term (current) use of anticoagulants: Secondary | ICD-10-CM | POA: Diagnosis not present

## 2016-03-14 DIAGNOSIS — N200 Calculus of kidney: Secondary | ICD-10-CM | POA: Diagnosis not present

## 2016-03-14 DIAGNOSIS — I10 Essential (primary) hypertension: Secondary | ICD-10-CM | POA: Diagnosis not present

## 2016-03-14 DIAGNOSIS — Z79899 Other long term (current) drug therapy: Secondary | ICD-10-CM | POA: Diagnosis not present

## 2016-03-14 DIAGNOSIS — E785 Hyperlipidemia, unspecified: Secondary | ICD-10-CM | POA: Diagnosis not present

## 2016-03-14 DIAGNOSIS — I4891 Unspecified atrial fibrillation: Secondary | ICD-10-CM | POA: Diagnosis not present

## 2016-03-14 DIAGNOSIS — R531 Weakness: Secondary | ICD-10-CM | POA: Diagnosis not present

## 2016-03-14 DIAGNOSIS — Z886 Allergy status to analgesic agent status: Secondary | ICD-10-CM | POA: Diagnosis not present

## 2016-03-14 DIAGNOSIS — R404 Transient alteration of awareness: Secondary | ICD-10-CM | POA: Diagnosis not present

## 2016-03-14 DIAGNOSIS — R1111 Vomiting without nausea: Secondary | ICD-10-CM | POA: Diagnosis not present

## 2016-03-14 DIAGNOSIS — Z7951 Long term (current) use of inhaled steroids: Secondary | ICD-10-CM | POA: Diagnosis not present

## 2016-03-14 DIAGNOSIS — Z885 Allergy status to narcotic agent status: Secondary | ICD-10-CM | POA: Diagnosis not present

## 2016-03-14 DIAGNOSIS — R197 Diarrhea, unspecified: Secondary | ICD-10-CM | POA: Diagnosis not present

## 2016-03-14 DIAGNOSIS — Z881 Allergy status to other antibiotic agents status: Secondary | ICD-10-CM | POA: Diagnosis not present

## 2016-03-14 DIAGNOSIS — R8271 Bacteriuria: Secondary | ICD-10-CM | POA: Diagnosis not present

## 2016-03-14 DIAGNOSIS — R112 Nausea with vomiting, unspecified: Secondary | ICD-10-CM | POA: Diagnosis not present

## 2016-03-14 DIAGNOSIS — R1110 Vomiting, unspecified: Secondary | ICD-10-CM | POA: Diagnosis not present

## 2016-04-06 DIAGNOSIS — Z79899 Other long term (current) drug therapy: Secondary | ICD-10-CM | POA: Diagnosis not present

## 2016-04-06 DIAGNOSIS — Z7901 Long term (current) use of anticoagulants: Secondary | ICD-10-CM | POA: Diagnosis not present

## 2016-04-06 DIAGNOSIS — Z881 Allergy status to other antibiotic agents status: Secondary | ICD-10-CM | POA: Diagnosis not present

## 2016-04-06 DIAGNOSIS — E785 Hyperlipidemia, unspecified: Secondary | ICD-10-CM | POA: Diagnosis not present

## 2016-04-06 DIAGNOSIS — I10 Essential (primary) hypertension: Secondary | ICD-10-CM | POA: Diagnosis not present

## 2016-04-06 DIAGNOSIS — Z886 Allergy status to analgesic agent status: Secondary | ICD-10-CM | POA: Diagnosis not present

## 2016-04-06 DIAGNOSIS — Z95810 Presence of automatic (implantable) cardiac defibrillator: Secondary | ICD-10-CM | POA: Diagnosis not present

## 2016-04-06 DIAGNOSIS — Z791 Long term (current) use of non-steroidal anti-inflammatories (NSAID): Secondary | ICD-10-CM | POA: Diagnosis not present

## 2016-04-06 DIAGNOSIS — R079 Chest pain, unspecified: Secondary | ICD-10-CM | POA: Diagnosis not present

## 2016-04-06 DIAGNOSIS — M179 Osteoarthritis of knee, unspecified: Secondary | ICD-10-CM | POA: Diagnosis not present

## 2016-04-06 DIAGNOSIS — Z885 Allergy status to narcotic agent status: Secondary | ICD-10-CM | POA: Diagnosis not present

## 2016-04-06 DIAGNOSIS — R0600 Dyspnea, unspecified: Secondary | ICD-10-CM | POA: Diagnosis not present

## 2016-04-06 DIAGNOSIS — R0789 Other chest pain: Secondary | ICD-10-CM | POA: Diagnosis not present

## 2016-04-06 DIAGNOSIS — I4891 Unspecified atrial fibrillation: Secondary | ICD-10-CM | POA: Diagnosis not present

## 2016-04-06 DIAGNOSIS — R51 Headache: Secondary | ICD-10-CM | POA: Diagnosis not present

## 2016-04-06 DIAGNOSIS — R0602 Shortness of breath: Secondary | ICD-10-CM | POA: Diagnosis not present

## 2016-04-08 ENCOUNTER — Encounter: Payer: Self-pay | Admitting: Family Medicine

## 2016-04-08 ENCOUNTER — Ambulatory Visit (INDEPENDENT_AMBULATORY_CARE_PROVIDER_SITE_OTHER): Payer: Medicare Other | Admitting: Family Medicine

## 2016-04-08 VITALS — BP 135/84 | HR 84 | Temp 98.0°F | Ht 64.0 in | Wt 172.0 lb

## 2016-04-08 DIAGNOSIS — F17200 Nicotine dependence, unspecified, uncomplicated: Secondary | ICD-10-CM

## 2016-04-08 DIAGNOSIS — K551 Chronic vascular disorders of intestine: Secondary | ICD-10-CM

## 2016-04-08 DIAGNOSIS — F332 Major depressive disorder, recurrent severe without psychotic features: Secondary | ICD-10-CM

## 2016-04-08 DIAGNOSIS — M170 Bilateral primary osteoarthritis of knee: Secondary | ICD-10-CM

## 2016-04-08 DIAGNOSIS — F411 Generalized anxiety disorder: Secondary | ICD-10-CM

## 2016-04-08 DIAGNOSIS — I723 Aneurysm of iliac artery: Secondary | ICD-10-CM

## 2016-04-08 DIAGNOSIS — Z1159 Encounter for screening for other viral diseases: Secondary | ICD-10-CM

## 2016-04-08 DIAGNOSIS — I679 Cerebrovascular disease, unspecified: Secondary | ICD-10-CM

## 2016-04-08 DIAGNOSIS — J0101 Acute recurrent maxillary sinusitis: Secondary | ICD-10-CM

## 2016-04-08 DIAGNOSIS — I771 Stricture of artery: Secondary | ICD-10-CM

## 2016-04-08 DIAGNOSIS — I779 Disorder of arteries and arterioles, unspecified: Secondary | ICD-10-CM

## 2016-04-08 DIAGNOSIS — I480 Paroxysmal atrial fibrillation: Secondary | ICD-10-CM

## 2016-04-08 DIAGNOSIS — I4581 Long QT syndrome: Secondary | ICD-10-CM

## 2016-04-08 DIAGNOSIS — I4891 Unspecified atrial fibrillation: Secondary | ICD-10-CM

## 2016-04-08 DIAGNOSIS — I739 Peripheral vascular disease, unspecified: Secondary | ICD-10-CM

## 2016-04-08 DIAGNOSIS — Z780 Asymptomatic menopausal state: Secondary | ICD-10-CM

## 2016-04-08 DIAGNOSIS — Z1239 Encounter for other screening for malignant neoplasm of breast: Secondary | ICD-10-CM

## 2016-04-08 MED ORDER — BUSPIRONE HCL 5 MG PO TABS: 5.0000 mg | ORAL_TABLET | Freq: Three times a day (TID) | ORAL | Status: DC

## 2016-04-08 MED ORDER — AMOXICILLIN 500 MG PO CAPS: 500.0000 mg | ORAL_CAPSULE | Freq: Three times a day (TID) | ORAL | Status: DC

## 2016-04-08 NOTE — Progress Notes (Signed)
[name] is a [age] y.o. female who presents [facility] [name]: Primary Care Sports Medicine today for establish care and discuss current sinusitis, current anxiety and depression, and cardiovascular history.  1) sinusitis: Ongoing for a few weeks now. Patient has bilateral frontal and maxillary sinus pressure associated with nasal discharge and congestion. She has tried some over-the-counter medicines that have helped a bit. She denies any fevers or chills nausea vomiting or diarrhea.  2) depression and anxiety: Patient recently moved [place]. Previously [facility] where she received Valium, Zoloft, and mirtazapine. She notes she has been weaning off of Valium last took it about 3 weeks ago. She notes continued anxiety and depression symptoms that are not very well controlled. She would like a referral to a psychiatrist in this area.  3) cardiovascular history: Patient has an extensive cardiovascular history. She has a history of long QT syndrome with a cardiac arrest and subsequent defibrillation. She has an implantable defibrillator. Additionally she has a history of atrial fibrillation that is currently rate controlled with metoprolol. She sees a cardiologist in [place] for these issues and was last seen about a year ago. She denies any current chest pains palpitations or shortness of breath.  4) vascular disease: Patient has extensive vascular disease involving or coronary arteries, carotid arteries, superior mesenteric artery, and iliac arteries seen on various imaging studies. She currently is asymptomatic with no stroke symptoms, abdominal pain, exercise limiting claudication. She continues to smoke about 5 cigarettes a day and is currently attempting to quit smoking.  5) COPD: Patient has been diagnosed with COPD in the past. She uses albuterol only intermittently.  She denies any exercise limiting shortness of breath. She denies any wheezing or cough.     Past Medical History  Diagnosis Date  . Coronary artery disease     non obstructive  . Seizures (HCC)   . Anxiety   . Depression   . Migraine   . Fibromuscular dysplasia (HCC)   . Tobacco abuse   . COPD (chronic obstructive pulmonary disease) (HCC)   . Arthritis of knee     Bilateral  . Tobacco abuse   . Migraine headache   . Anxiety disorder   . Depression   . Seizure disorder (HCC)   . Cerebrovascular disease   . CAD (coronary artery disease)   . Automatic implantable cardiac defibrillator in situ     [employer] ICD  . HTN (hypertension)   . HLD (hyperlipidemia)   . Ventricular fibrillation (HCC)   . Long QT syndrome   . Cardiac arrest (HCC)   . Atrial fibrillation (HCC)     a) 2D echo 09/11/12: LVEF 55-60%, mild LVH, mld LA dilatation  . Allergy   . Stroke [facility](HCC)     no deficits   Past Surgical History  Procedure Laterality Date  . Pacemaker insertion  05/01/2003    ICD, implantation of a Guidant single chamber defibrillator, Doylene CanningGregg W. Ladona Ridgelaylor MD  . Cholecystectomy    . Tubal ligation    . Breast lumpectomy    . Appendectomy    . Cosmetic surgery    . Cardiac defibrillator placement     Social History  Substance Use Topics  . Smoking status: Current Every Day Smoker -- 0.25 packs/day for 40 years    Types: Cigarettes  . Smokeless tobacco: Never Used     Comment: almost a pack a week  . Alcohol Use:  No   family history includes Aneurysm in her sister; Cancer in her sister; Colon cancer in her sister; Dementia in her mother; Stroke in her mother and sister; Uterine cancer in her other.  ROS as above: No headache, visual changes, nausea, vomiting, diarrhea, constipation, dizziness, abdominal pain, skin rash, fevers, chills, night sweats, weight loss, swollen lymph nodes, body aches, joint swelling, muscle aches, chest pain, shortness of breath, mood changes, visual  or auditory hallucinations.   Medications: Current Outpatient Prescriptions  Medication Sig Dispense Refill  . acetaminophen (TYLENOL) 500 MG tablet Take 1,000 mg by mouth every 6 (six) hours as needed for headache (pain).     Marland Kitchen albuterol (PROVENTIL HFA;VENTOLIN HFA) 108 (90 BASE) MCG/ACT inhaler Inhale 2 puffs into the lungs every 6 (six) hours as needed for wheezing. Reported on 01/09/2016    . diazepam (VALIUM) 5 MG tablet Take 5 mg by mouth 3 (three) times daily.    Marland Kitchen dicyclomine (BENTYL) 20 MG tablet Take by mouth.    . diphenhydrAMINE (BENADRYL) 25 MG tablet Take 50 mg by mouth every 6 (six) hours as needed for allergies or sleep. Reported on 01/09/2016    . metoprolol tartrate (LOPRESSOR) 25 MG tablet Take 1 tablet (25 mg total) by mouth 2 (two) times daily. OK to take an extra tablet daily as needed. 45 tablet 11  . mirtazapine (REMERON) 15 MG tablet Take 15-30 mg by mouth at bedtime as needed (sleep). Reported on 10/31/2015    . oxymetazoline (AFRIN) 0.05 % nasal spray Place 2 sprays into both nostrils 2 (two) times daily as needed for congestion. Reported on 12/04/2015    . Rivaroxaban (XARELTO) 15 MG TABS tablet Take 1 tablet (15 mg total) by mouth at bedtime. Please call and schedule an appointment 30 tablet 3  . sertraline (ZOLOFT) 100 MG tablet Take 200 mg by mouth daily.     . simvastatin (ZOCOR) 20 MG tablet Take 1 tablet (20 mg total) by mouth at bedtime. 90 tablet 3  . amoxicillin (AMOXIL) 500 MG capsule Take 1 capsule (500 mg total) by mouth 3 (three) times daily. 30 capsule 0  . busPIRone (BUSPAR) 5 MG tablet Take 1 tablet (5 mg total) by mouth 3 (three) times daily. 90 tablet 0   No current facility-administered medications for this visit.   Allergies  Allergen Reactions  . Azithromycin Other (See Comments)    Hypotension   . Ibuprofen Other (See Comments)    Hypotension. Takes ASA without problem   . Codeine Rash  . Demerol [Meperidine] Rash     Exam:  BP 135/84  mmHg  Pulse 84  Temp(Src) 98 F (36.7 C) (Oral)  Ht  (1.626 m)  Wt 172 lb (78.019 kg)  BMI 29.51 kg/m2  SpO2 97% Gen: Well NAD HEENT: EOMI,  MMM Clear nasal discharge. Tender palpation bilateral maxillary and frontal sinuses. Normal tympanic membranes bilaterally no cervical lymphadenopathy  Lungs: Normal work of breathing.Prolonged expiratory phase without wheezing  Heart: RRR no MRG Abd: NABS, Soft. Nondistended, Nontender Exts: Brisk capillary refill, warm and well perfused. Pulses intact feet Bilaterally Psych: Alert and oriented normal speech process and affect.  Depression screen [facility] 2/9 04/08/2016 02/11/2016 01/14/2016 12/19/2015 12/12/2015  Decreased Interest 0  Down, Depressed, Hopeless 0  PHQ - 2 Score 0  Altered sleeping -  Tired, decreased energy -  Change in appetite  3 0 0 0 -  Feeling bad or failure about yourself  2 1 1 1  -  Trouble concentrating 2 0 0 0 -  Moving slowly or fidgety/restless 3 1 1 1  -  Suicidal thoughts 1 0 0 0 -  PHQ-9 Score 23 6 6 6  -  Difficult doing work/chores Extremely dIfficult Somewhat difficult Somewhat difficult Somewhat difficult -    GAD 7 : Generalized Anxiety Score 04/08/2016  Nervous, Anxious, on Edge 3  Control/stop worrying 3  Worry too much - different things 3  Trouble relaxing 3  Restless 3  Easily annoyed or irritable 3  Afraid - awful might happen 3  Total GAD 7 Score 21  Anxiety Difficulty Extremely difficult       No results found for this or any previous visit (from the past 24 hour(s)). No results found.    Assessment and Plan: 65 y.o. female with  1) sinusitis: Viral versus bacterial. Discussed options. Plan to treat with amoxicillin. Watchful waiting continue over-the-counter medications return if not better 2) pressure and anxiety: Not well controlled. Avoid benzodiazepines due to history of abuse. Continue Zoloft and mirtazapine. Add buspirone. Refer to psychiatry.  Recheck in 1 month 3) history of long QT syndrome: Currently implanted defibrillator. Refer to cardiology and has been a year since she was seen last. Hopefully we can get her into the [facility] cardiology location. 4) extensive vascular disease: Stressed the importance of quitting smoking. Check fasting lipids. 5) COPD: She is to be well controlled. Recommend smoking cessation 6) health maintenance: Obtain hepatitis C test, order mammogram and bone density scan.  Recheck in one month  Discussed warning signs or symptoms. Please see discharge instructions. Patient expresses understanding.

## 2016-04-08 NOTE — Patient Instructions (Addendum)
Thank you for coming in today. Get fasting labs soon.  Follow up with your heart doctor. It has been a year since you were seen.  We will refer to a psychiatrist.  Get mammogram and bone density testing soon.  Take amoxicillin for sinus infection.  Return in 1 month.   Work on quitting smoking.   Sinusitis, Adult Sinusitis is redness, soreness, and inflammation of the paranasal sinuses. Paranasal sinuses are air pockets within the bones of your face. They are located beneath your eyes, in the middle of your forehead, and above your eyes. In healthy paranasal sinuses, mucus is able to drain out, and air is able to circulate through them by way of your nose. However, when your paranasal sinuses are inflamed, mucus and air can become trapped. This can allow bacteria and other germs to grow and cause infection. Sinusitis can develop quickly and last only a short time (acute) or continue over a long period (chronic). Sinusitis that lasts for more than 12 weeks is considered chronic. CAUSES Causes of sinusitis include:  Allergies.  Structural abnormalities, such as displacement of the cartilage that separates your nostrils (deviated septum), which can decrease the air flow through your nose and sinuses and affect sinus drainage.  Functional abnormalities, such as when the small hairs (cilia) that line your sinuses and help remove mucus do not work properly or are not present. SIGNS AND SYMPTOMS Symptoms of acute and chronic sinusitis are the same. The primary symptoms are pain and pressure around the affected sinuses. Other symptoms include:  Upper toothache.  Earache.  Headache.  Bad breath.  Decreased sense of smell and taste.  A cough, which worsens when you are lying flat.  Fatigue.  Fever.  Thick drainage from your nose, which often is green and may contain pus (purulent).  Swelling and warmth over the affected sinuses. DIAGNOSIS Your health care provider will perform a  physical exam. During your exam, your health care provider may perform any of the following to help determine if you have acute sinusitis or chronic sinusitis:  Look in your nose for signs of abnormal growths in your nostrils (nasal polyps).  Tap over the affected sinus to check for signs of infection.  View the inside of your sinuses using an imaging device that has a light attached (endoscope). If your health care provider suspects that you have chronic sinusitis, one or more of the following tests may be recommended:  Allergy tests.  Nasal culture. A sample of mucus is taken from your nose, sent to a lab, and screened for bacteria.  Nasal cytology. A sample of mucus is taken from your nose and examined by your health care provider to determine if your sinusitis is related to an allergy. TREATMENT Most cases of acute sinusitis are related to a viral infection and will resolve on their own within 10 days. Sometimes, medicines are prescribed to help relieve symptoms of both acute and chronic sinusitis. These may include pain medicines, decongestants, nasal steroid sprays, or saline sprays. However, for sinusitis related to a bacterial infection, your health care provider will prescribe antibiotic medicines. These are medicines that will help kill the bacteria causing the infection. Rarely, sinusitis is caused by a fungal infection. In these cases, your health care provider will prescribe antifungal medicine. For some cases of chronic sinusitis, surgery is needed. Generally, these are cases in which sinusitis recurs more than 3 times per year, despite other treatments. HOME CARE INSTRUCTIONS  Drink plenty of water. Water  helps thin the mucus so your sinuses can drain more easily.  Use a humidifier.  Inhale steam 3-4 times a day (for example, sit in the bathroom with the shower running).  Apply a warm, moist washcloth to your face 3-4 times a day, or as directed by your health care  provider.  Use saline nasal sprays to help moisten and clean your sinuses.  Take medicines only as directed by your health care provider.  If you were prescribed either an antibiotic or antifungal medicine, finish it all even if you start to feel better. SEEK IMMEDIATE MEDICAL CARE IF:  You have increasing pain or severe headaches.  You have nausea, vomiting, or drowsiness.  You have swelling around your face.  You have vision problems.  You have a stiff neck.  You have difficulty breathing.   This information is not intended to replace advice given to you by your health care provider. Make sure you discuss any questions you have with your health care provider.   Document Released: 09/08/2005 Document Revised: 09/29/2014 Document Reviewed: 09/23/2011 Elsevier Interactive Patient Education [employer]2016 Elsevier Inc.

## 2016-04-10 DIAGNOSIS — Z95810 Presence of automatic (implantable) cardiac defibrillator: Secondary | ICD-10-CM | POA: Diagnosis not present

## 2016-04-10 DIAGNOSIS — F419 Anxiety disorder, unspecified: Secondary | ICD-10-CM | POA: Diagnosis not present

## 2016-04-10 DIAGNOSIS — I10 Essential (primary) hypertension: Secondary | ICD-10-CM | POA: Diagnosis not present

## 2016-04-10 DIAGNOSIS — Z883 Allergy status to other anti-infective agents status: Secondary | ICD-10-CM | POA: Diagnosis not present

## 2016-04-10 DIAGNOSIS — Z79899 Other long term (current) drug therapy: Secondary | ICD-10-CM | POA: Diagnosis not present

## 2016-04-10 DIAGNOSIS — F29 Unspecified psychosis not due to a substance or known physiological condition: Secondary | ICD-10-CM | POA: Diagnosis not present

## 2016-04-10 DIAGNOSIS — I4891 Unspecified atrial fibrillation: Secondary | ICD-10-CM | POA: Diagnosis not present

## 2016-04-10 DIAGNOSIS — Z885 Allergy status to narcotic agent status: Secondary | ICD-10-CM | POA: Diagnosis not present

## 2016-04-10 DIAGNOSIS — Z7951 Long term (current) use of inhaled steroids: Secondary | ICD-10-CM | POA: Diagnosis not present

## 2016-04-10 DIAGNOSIS — Z886 Allergy status to analgesic agent status: Secondary | ICD-10-CM | POA: Diagnosis not present

## 2016-04-10 DIAGNOSIS — E785 Hyperlipidemia, unspecified: Secondary | ICD-10-CM | POA: Diagnosis not present

## 2016-04-10 DIAGNOSIS — F17200 Nicotine dependence, unspecified, uncomplicated: Secondary | ICD-10-CM | POA: Diagnosis not present

## 2016-04-12 DIAGNOSIS — I4891 Unspecified atrial fibrillation: Secondary | ICD-10-CM | POA: Diagnosis not present

## 2016-04-12 DIAGNOSIS — Z888 Allergy status to other drugs, medicaments and biological substances status: Secondary | ICD-10-CM | POA: Diagnosis not present

## 2016-04-12 DIAGNOSIS — Z7901 Long term (current) use of anticoagulants: Secondary | ICD-10-CM | POA: Diagnosis not present

## 2016-04-12 DIAGNOSIS — Z95810 Presence of automatic (implantable) cardiac defibrillator: Secondary | ICD-10-CM | POA: Diagnosis not present

## 2016-04-12 DIAGNOSIS — E785 Hyperlipidemia, unspecified: Secondary | ICD-10-CM | POA: Diagnosis not present

## 2016-04-12 DIAGNOSIS — Z7951 Long term (current) use of inhaled steroids: Secondary | ICD-10-CM | POA: Diagnosis not present

## 2016-04-12 DIAGNOSIS — Z880 Allergy status to penicillin: Secondary | ICD-10-CM | POA: Diagnosis not present

## 2016-04-12 DIAGNOSIS — I10 Essential (primary) hypertension: Secondary | ICD-10-CM | POA: Diagnosis not present

## 2016-04-12 DIAGNOSIS — Z885 Allergy status to narcotic agent status: Secondary | ICD-10-CM | POA: Diagnosis not present

## 2016-04-12 DIAGNOSIS — M179 Osteoarthritis of knee, unspecified: Secondary | ICD-10-CM | POA: Diagnosis not present

## 2016-04-12 DIAGNOSIS — I480 Paroxysmal atrial fibrillation: Secondary | ICD-10-CM | POA: Diagnosis not present

## 2016-04-12 DIAGNOSIS — Z79899 Other long term (current) drug therapy: Secondary | ICD-10-CM | POA: Diagnosis not present

## 2016-04-12 DIAGNOSIS — R0981 Nasal congestion: Secondary | ICD-10-CM | POA: Diagnosis not present

## 2016-04-12 DIAGNOSIS — R0602 Shortness of breath: Secondary | ICD-10-CM | POA: Diagnosis not present

## 2016-04-14 ENCOUNTER — Emergency Department (HOSPITAL_COMMUNITY): Payer: Medicare Other

## 2016-04-14 ENCOUNTER — Emergency Department (HOSPITAL_COMMUNITY)
Admission: EM | Admit: 2016-04-14 | Discharge: 2016-04-14 | Disposition: A | Discharge status: Other Acute Inpatient Hospital | Payer: Medicare Other | Attending: Emergency Medicine | Admitting: Emergency Medicine

## 2016-04-14 ENCOUNTER — Encounter (HOSPITAL_COMMUNITY): Payer: Self-pay | Admitting: Emergency Medicine

## 2016-04-14 DIAGNOSIS — F410 Panic disorder [episodic paroxysmal anxiety] without agoraphobia: Secondary | ICD-10-CM | POA: Insufficient documentation

## 2016-04-14 DIAGNOSIS — F329 Major depressive disorder, single episode, unspecified: Secondary | ICD-10-CM | POA: Diagnosis not present

## 2016-04-14 DIAGNOSIS — I4891 Unspecified atrial fibrillation: Secondary | ICD-10-CM | POA: Diagnosis not present

## 2016-04-14 DIAGNOSIS — J449 Chronic obstructive pulmonary disease, unspecified: Secondary | ICD-10-CM | POA: Diagnosis not present

## 2016-04-14 DIAGNOSIS — M1990 Unspecified osteoarthritis, unspecified site: Secondary | ICD-10-CM | POA: Insufficient documentation

## 2016-04-14 DIAGNOSIS — E785 Hyperlipidemia, unspecified: Secondary | ICD-10-CM | POA: Diagnosis not present

## 2016-04-14 DIAGNOSIS — I2510 Atherosclerotic heart disease of native coronary artery without angina pectoris: Secondary | ICD-10-CM | POA: Diagnosis not present

## 2016-04-14 DIAGNOSIS — Z8673 Personal history of transient ischemic attack (TIA), and cerebral infarction without residual deficits: Secondary | ICD-10-CM | POA: Insufficient documentation

## 2016-04-14 DIAGNOSIS — I10 Essential (primary) hypertension: Secondary | ICD-10-CM | POA: Diagnosis not present

## 2016-04-14 DIAGNOSIS — Z049 Encounter for examination and observation for unspecified reason: Secondary | ICD-10-CM

## 2016-04-14 DIAGNOSIS — R0602 Shortness of breath: Secondary | ICD-10-CM | POA: Diagnosis not present

## 2016-04-14 LAB — URINE MICROSCOPIC-ADD ON

## 2016-04-14 LAB — I-STAT TROPONIN, ED: TROPONIN I, POC: 0 ng/mL (ref 0.00–0.08)

## 2016-04-14 LAB — URINALYSIS, ROUTINE W REFLEX MICROSCOPIC
Bilirubin Urine: NEGATIVE
Glucose, UA: NEGATIVE mg/dL
Hgb urine dipstick: NEGATIVE
Ketones, ur: NEGATIVE mg/dL
Nitrite: NEGATIVE
Protein, ur: NEGATIVE mg/dL
Specific Gravity, Urine: 1.02 (ref 1.005–1.030)
pH: 6 (ref 5.0–8.0)

## 2016-04-14 MED ORDER — LORAZEPAM 1 MG PO TABS
1.0000 mg | ORAL_TABLET | Freq: Three times a day (TID) | ORAL | Status: DC | PRN
  Administered 2016-04-14: 1 mg via ORAL
  Filled 2016-04-14: qty 1

## 2016-04-14 MED ORDER — MIRTAZAPINE 7.5 MG PO TABS: 7.5000 mg | ORAL_TABLET | Freq: Four times a day (QID) | ORAL | Status: DC | PRN

## 2016-04-14 MED ORDER — OXYMETAZOLINE HCL 0.05 % NA SOLN: 2.0000 | Freq: Two times a day (BID) | NASAL | Status: DC | PRN

## 2016-04-14 MED ORDER — RIVAROXABAN 15 MG PO TABS
15.0000 mg | ORAL_TABLET | Freq: Every day | ORAL | Status: DC
  Filled 2016-04-14: qty 1

## 2016-04-14 MED ORDER — FLUTICASONE PROPIONATE 50 MCG/ACT NA SUSP
2.0000 | Freq: Every day | NASAL | Status: DC | PRN
  Filled 2016-04-14: qty 16

## 2016-04-14 MED ORDER — LORAZEPAM 0.5 MG PO TABS
0.5000 mg | ORAL_TABLET | Freq: Once | ORAL | Status: AC
  Administered 2016-04-14: 0.5 mg via ORAL
  Filled 2016-04-14: qty 1

## 2016-04-14 MED ORDER — MIRTAZAPINE 7.5 MG PO TABS: 7.5000 mg | ORAL_TABLET | Freq: Every day | ORAL | Status: DC

## 2016-04-14 MED ORDER — ACETAMINOPHEN 500 MG PO TABS: 500.0000 mg | ORAL_TABLET | Freq: Four times a day (QID) | ORAL | Status: DC | PRN

## 2016-04-14 MED ORDER — SIMVASTATIN 20 MG PO TABS
20.0000 mg | ORAL_TABLET | Freq: Every day | ORAL | Status: DC
  Filled 2016-04-14: qty 1

## 2016-04-14 MED ORDER — METOPROLOL TARTRATE 25 MG PO TABS
25.0000 mg | ORAL_TABLET | Freq: Two times a day (BID) | ORAL | Status: DC
  Administered 2016-04-14: 25 mg via ORAL
  Filled 2016-04-14: qty 1

## 2016-04-14 MED ORDER — DIPHENHYDRAMINE HCL 25 MG PO TABS: 50.0000 mg | ORAL_TABLET | Freq: Four times a day (QID) | ORAL | Status: DC | PRN

## 2016-04-14 MED ORDER — LOPERAMIDE HCL 2 MG PO CAPS
2.0000 mg | ORAL_CAPSULE | Freq: Four times a day (QID) | ORAL | Status: DC | PRN
Start: 2016-04-14 — End: 2016-04-14

## 2016-04-14 NOTE — ED Notes (Signed)
TTS consult completed 

## 2016-04-14 NOTE — ED Provider Notes (Addendum)
[facility]-EMERGENCY DEPT Provider Note   CSN: [id number] Arrival date & time: 04/14/16  5284  First Provider Contact:  First MD Initiated Contact with Patient 04/14/16 0405        History   Chief Complaint Chief Complaint  Patient presents with  . Anxiety    HPI [name] is a 65 y.o. female.  This a 65 year old female with a long-standing history of panic attacks and anxiety who has been on anti-anxiolytics since the age of 28, was recently taken off her medications and started on Remeron, which he states that helps her sleep, but does not help with her anxiety during the day.  She recently moved from [facility] to [facility] where she has been unable to establish care from a psychiatrist.  Modena Jansky she presents with increased anxiety, frustration stating that if she can't get her anxiety attacks under control.  She would rather die    Anxiety  Pertinent negatives include no chest pain, fever, headaches or nausea.    Past Medical History:  Diagnosis Date  . Allergy   . Anxiety   . Anxiety disorder   . Arthritis of knee    Bilateral  . Atrial fibrillation (HCC)    a) 2D echo 09/11/12: LVEF 55-60%, mild LVH, mld LA dilatation  . Automatic implantable cardiac defibrillator in situ    [employer] ICD  . CAD (coronary artery disease)   . Cardiac arrest (HCC)   . Cerebrovascular disease   . COPD (chronic obstructive pulmonary disease) (HCC)   . Coronary artery disease    non obstructive  . Depression   . Depression   . Fibromuscular dysplasia (HCC)   . HLD (hyperlipidemia)   . HTN (hypertension)   . Long QT syndrome   . Migraine   . Migraine headache   . Seizure disorder (HCC)   . Seizures (HCC)   . Stroke [facility])    no deficits  . Tobacco abuse   . Tobacco abuse   . Ventricular fibrillation [facility])     Patient Active Problem List   Diagnosis Date Noted  . 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 Carotid artery  disease (HCC) 04/08/2016  . Benzodiazepine dependence (HCC) 02/11/2014  . Paroxysmal atrial fibrillation (HCC) 11/04/2013  . Atrial fibrillation with RVR (HCC) 09/12/2012  . History of ventricular fibrillation 09/12/2012  . Anxiety state 05/22/2009  . TOBACCO ABUSE 05/22/2009  . Major depression (HCC) 05/22/2009  . MIGRAINE HEADACHE 05/22/2009  . CAD 05/22/2009  . Long QT syndrome 05/22/2009  . CARDIAC ARREST 05/22/2009  . CEREBROVASCULAR DISEASE 05/22/2009  . COPD 05/22/2009  . DJD (degenerative joint disease) of knee 05/22/2009  . SEIZURE DISORDER 05/22/2009  . AUTOMATIC IMPLANTABLE CARDIAC DEFIBRILLATOR SITU 05/22/2009    Past Surgical History:  Procedure Laterality Date  . APPENDECTOMY    . BREAST LUMPECTOMY    . CARDIAC DEFIBRILLATOR PLACEMENT    . CHOLECYSTECTOMY    . COSMETIC SURGERY    . PACEMAKER INSERTION  05/01/2003   ICD, implantation of a Guidant single chamber defibrillator, Doylene Canning. Ladona Ridgel MD  . TUBAL LIGATION      OB History    No data available       Home Medications    Prior to Admission medications   Medication Sig Start Date End Date Taking? Authorizing Provider  acetaminophen (TYLENOL) 500 MG tablet Take 1,000 mg by mouth every 6 (six) hours as needed for headache (pain).  Historical Provider, MD  albuterol (PROVENTIL HFA;VENTOLIN HFA) 108 (90 BASE) MCG/ACT inhaler Inhale 2 puffs into the lungs every 6 (six) hours as needed for wheezing. Reported on 01/09/2016    Historical Provider, MD  amoxicillin (AMOXIL) 500 MG capsule Take 1 capsule (500 mg total) by mouth 3 (three) times daily. 04/08/16   Rodolph Bong, MD  busPIRone (BUSPAR) 5 MG tablet Take 1 tablet (5 mg total) by mouth 3 (three) times daily. 04/08/16   Rodolph Bong, MD  diazepam (VALIUM) 5 MG tablet Take 5 mg by mouth 3 (three) times daily. 10/25/15   Historical Provider, MD  dicyclomine (BENTYL) 20 MG tablet Take by mouth. 03/14/16 03/14/17  Historical Provider, MD  diphenhydrAMINE (BENADRYL)  25 MG tablet Take 50 mg by mouth every 6 (six) hours as needed for allergies or sleep. Reported on 01/09/2016    Historical Provider, MD  metoprolol tartrate (LOPRESSOR) 25 MG tablet Take 1 tablet (25 mg total) by mouth 2 (two) times daily. OK to take an extra tablet daily as needed. 12/01/15   Rhonda G Barrett, PA-C  mirtazapine (REMERON) 15 MG tablet Take 15-30 mg by mouth at bedtime as needed (sleep). Reported on 10/31/2015 10/20/14   Historical Provider, MD  oxymetazoline (AFRIN) 0.05 % nasal spray Place 2 sprays into both nostrils 2 (two) times daily as needed for congestion. Reported on 12/04/2015    Historical Provider, MD  Rivaroxaban (XARELTO) 15 MG TABS tablet Take 1 tablet (15 mg total) by mouth at bedtime. Please call and schedule an appointment 01/29/16   Iran Ouch, MD  sertraline (ZOLOFT) 100 MG tablet Take 200 mg by mouth daily.     Historical Provider, MD  simvastatin (ZOCOR) 20 MG tablet Take 1 tablet (20 mg total) by mouth at bedtime. 06/06/15   Iran Ouch, MD    Family History Family History  Problem Relation Age of Onset  . Dementia Mother   . Stroke Mother     Multiple  . Aneurysm Sister     Brain  . Stroke Sister   . Colon cancer Sister   . Cancer Sister   . Uterine cancer Other     Grandmother    Social History Social History  Substance Use Topics  . Smoking status: Current Every Day Smoker    Packs/day: 0.25    Years: 40.00    Types: Cigarettes  . Smokeless tobacco: Never Used     Comment: almost a pack a week  . Alcohol use No     Allergies   Azithromycin; Ibuprofen; Codeine; and Demerol [meperidine]   Review of Systems Review of Systems  Constitutional: Negative for fever.  Respiratory: Negative for shortness of breath.   Cardiovascular: Negative for chest pain.  Gastrointestinal: Negative for nausea.  Neurological: Negative for speech difficulty and headaches.  Psychiatric/Behavioral: The patient is nervous/anxious.   All other systems  reviewed and are negative.    Physical Exam Updated Vital Signs BP 146/76   Pulse 65   Temp 97.8 F (36.6 C) (Oral)   Resp 18   SpO2 98%   Physical Exam  Constitutional: She is oriented to person, place, and time. She appears well-developed and well-nourished. No distress.  HENT:  Head: Normocephalic.  Eyes: Pupils are equal, round, and reactive to light.  Neck: Normal range of motion.  Cardiovascular: Normal rate.   Pulmonary/Chest: Effort normal.  Musculoskeletal: Normal range of motion.  Neurological: She is alert and oriented to person, place, and time.  Skin: Skin is warm and dry.  Psychiatric: Her speech is normal and behavior is normal. Judgment normal. Her mood appears anxious. Cognition and memory are normal. She expresses suicidal ideation. She expresses no suicidal plans.  Nursing note and vitals reviewed.    ED Treatments / Results  Labs (all labs ordered are listed, but only abnormal results are displayed) Labs Reviewed  Rosezena Sensor, ED    EKG  EKG Interpretation None       Radiology No results found.  Procedures Procedures (including critical care time)  Medications Ordered in ED Medications - No data to display   Initial Impression / Assessment and Plan / ED Course  I have reviewed the triage vital signs and the nursing notes.  Pertinent labs & imaging results that were available during my care of the patient were reviewed by me and considered in my medical decision making (see chart for details).  Clinical Course    Evaluated by TTS will be evaluated by PSY in AM   Final Clinical Impressions(s) / ED Diagnoses   Final diagnoses:  None    New Prescriptions New Prescriptions   No medications on file     Earley Favor, NP 04/14/16 0411    Geoffery Lyons, MD 04/14/16 0434    Earley Favor, NP 04/14/16 1610    Geoffery Lyons, MD 04/14/16 [phone]

## 2016-04-14 NOTE — Progress Notes (Signed)
Patient presents to Ed with panic attack.  Per chart review, patient has just moved from [place] to [facility].  Patient having difficulty establishing care with a new psychiatrist where she lives now.  EDCM spoke to patient at bedside.  Patient confirms her pcp is Dr. Denyse Amass.  Patient reports she has just seen him on Monday for the first time.  She reports her psychiatrist in [place] is located at [facility].  [facility] encouraged patient to ask for assistance from her pcp and her psychiatrist at [facility] in finding a new psychiatrist in [facility]  EDCM also provided patient with list of psyhchiatrists within a ten mile radius of patient's zip code [address].  Patient reports she will use the services for the [facility] for transportation to her doctors appointments.  Patient thankful for services.  No further EDCM needs at this time.

## 2016-04-14 NOTE — ED Notes (Signed)
Pt has pair of earrings, necklace, watch, and two rings added to pt belongings bag. See note 1051 patient belongings.

## 2016-04-14 NOTE — ED Notes (Signed)
Pt signed voluntary to go to [facility] / printed copy sent with pt

## 2016-04-14 NOTE — ED Notes (Signed)
Pt denies pain but reports increased anxiety. Spoke with EDP who reports will place one time dose of ativan until psychiatry able to review pt needs.

## 2016-04-14 NOTE — ED Notes (Signed)
Per Mr. Venda Rodes,  TTS consultant, pt is to be seen and further evaluated by psychiatrist in the morning.

## 2016-04-14 NOTE — Progress Notes (Signed)
CSW spoke with admissions/[facility] who states that pt has been offered a bed at their facility. She states pt is welcomed to come tonight. CSW made nurse aware.  Patient has signed voluntary form. CSW placed the form in the patient's folder and faxed the the form to [facility].    [facility] ( Nurse Report Number) [id number]  Trish Mage [phone] ED CSW 04/14/2016 6:55 PM

## 2016-04-14 NOTE — ED Notes (Signed)
TTS consult via telepsych in process at this time. 

## 2016-04-14 NOTE — ED Provider Notes (Signed)
5:25 PM patient complains of anxiety. Ativan 0.5 mg ordered as a one-time dose. Remeron has been discontinued presumably because of long QT syndrome and patient has long QT.   Doug Sou, MD 04/14/16 [phone]

## 2016-04-14 NOTE — ED Notes (Addendum)
[facility] transporting pt to [place]. Victorino Dike at [facility] given report.

## 2016-04-14 NOTE — ED Triage Notes (Signed)
Brought in by [facility] with c/o anxiety.  Per EMS, pt called and reported that she is having "a panic attack" causing her chest tightness and discomfort.  Pt arrived to ED A/Ox4, calm and in no s/s apparent distress.

## 2016-04-14 NOTE — ED Notes (Signed)
Bed: [facility] Expected date:  Expected time:  Means of arrival:  Comments: EMS psych

## 2016-04-14 NOTE — BH Assessment (Addendum)
Tele Assessment Note   [name] is an 65 y.o. divorced female who presents to [facility] due to severe anxiety and panic attacks. Pt reports she was diagnosed with panic attacks forty-four years ago and has been receiving psychiatric medication management and therapy most of her life. Pt says last month her psychiatrist took her off Valium and Zoloft, due to Valium being associated with dementia, and prescribed only Remeron. Pt says Remeron doesn't help her anxiety during the day. Pt reports she has severe anxiety and frequent panic attacks which has made her unable to due her normal activities. Pt says she is afraid to leave her home, stays in bed and isn't caring for her hygiene and grooming the way she normally does. Pt reports symptoms including crying spells, loss of interest in usual pleasures, fatigue, decreased concentration, decreased sleep, decreased appetite and feelings of guilt and hopelessness. Pt says she is normally an active, social person who enjoys helping people and now feels like a disappointment to her family. Pt reports she has had suicidal thoughts with no plan or intent but says she would rather die than always feel so fearful and anxious. She denies any history of suicide attempts. She denies any history of intentional self-injurious behavior. She denies homicidal ideation or history of violence. She denies any history of psychotic symptoms. She denies any history of alcohol or substance use.  Pt cannot identify any specific stressors. She says she lives in an independent living facility and enjoys helping the other residents who are older. She says she has a son, daughter and three grandchildren who are all doing well. Pt denies any history of abuse or trauma. Pt reports she usually feels she "lives a blessed life."  Pt states she has a cardiac defibrillator and is fearful her heart will stop. She says she has been to the ED in [facility] several times within the  past month fearful something is wrong with her heart. Pt says she moved to [facility] and her primary care physician was supposed to refer her to a psychiatrist and therapist but has not done so. Pt reports she is compliant with medications and has no history of abusing medications. She denies any history of inpatient psychiatric treatment.  Pt is dressed in hospital scrubs, alert, oriented x4 with normal speech and normal motor behavior. Eye contact is good. Pt's mood is anxious and affect is congruent with mood. Thought process is coherent and relevant. There is no indication Pt is currently responding to internal stimuli or experiencing delusional thought content. Pt was pleasant and cooperative throughout assessment. She says she is motivated for treatment and wants both medication management and therapy..   Diagnosis: Panic Disorder, Generalized Anxiety Disorder  Past Medical History:  Past Medical History:  Diagnosis Date  . Allergy   . Anxiety   . Anxiety disorder   . Arthritis of knee    Bilateral  . Atrial fibrillation (HCC)    a) 2D echo 09/11/12: LVEF 55-60%, mild LVH, mld LA dilatation  . Automatic implantable cardiac defibrillator in situ    [employer] ICD  . CAD (coronary artery disease)   . Cardiac arrest (HCC)   . Cerebrovascular disease   . COPD (chronic obstructive pulmonary disease) (HCC)   . Coronary artery disease    non obstructive  . Depression   . Depression   . Fibromuscular dysplasia (HCC)   . HLD (hyperlipidemia)   . HTN (hypertension)   . Long QT  syndrome   . Migraine   . Migraine headache   . Seizure disorder (HCC)   . Seizures (HCC)   . Stroke [facility])    no deficits  . Tobacco abuse   . Tobacco abuse   . Ventricular fibrillation [facility])     Past Surgical History:  Procedure Laterality Date  . APPENDECTOMY    . BREAST LUMPECTOMY    . CARDIAC DEFIBRILLATOR PLACEMENT    . CHOLECYSTECTOMY    . COSMETIC SURGERY    . PACEMAKER INSERTION   05/01/2003   ICD, implantation of a Guidant single chamber defibrillator, Doylene Canning. Ladona Ridgel MD  . TUBAL LIGATION      Family History:  Family History  Problem Relation Age of Onset  . Dementia Mother   . Stroke Mother     Multiple  . Aneurysm Sister     Brain  . Stroke Sister   . Colon cancer Sister   . Cancer Sister   . Uterine cancer Other     Grandmother    Social History:  reports that she has been smoking Cigarettes.  She has a 10.00 pack-year smoking history. She has never used smokeless tobacco. She reports that she does not drink alcohol or use drugs.  Additional Social History:  Alcohol / Drug Use Pain Medications: Denies abuse Prescriptions: Denies abuse Over the Counter: Denies abuse History of alcohol / drug use?: No history of alcohol / drug abuse Longest period of sobriety (when/how long): NA  CIWA: CIWA-Ar BP: 146/76 Pulse Rate: 65 COWS:    PATIENT STRENGTHS: (choose at least two) Ability for insight Average or above average intelligence Capable of independent living Communication skills Financial means General fund of knowledge Motivation for treatment/growth Physical Health Religious Affiliation Supportive family/friends  Allergies:  Allergies  Allergen Reactions  . Azithromycin Other (See Comments)    Hypotension   . Ibuprofen Other (See Comments)    Hypotension. Takes ASA without problem   . Codeine Rash  . Demerol [Meperidine] Rash    Home Medications:  (Not in a hospital admission)  OB/GYN Status:  No LMP recorded. Patient is postmenopausal.  General Assessment Data Location of Assessment: [facility] TTS Assessment: In system Is this a Tele or Face-to-Face Assessment?: Tele Assessment Is this an Initial Assessment or a Re-assessment for this encounter?: Initial Assessment Marital status: Divorced Girard name: [name] Is patient pregnant?: No Pregnancy Status: No Living Arrangements: Other (Comment), Alone (Independent living  facility) Can pt return to current living arrangement?: Yes Admission Status: Voluntary Is patient capable of signing voluntary admission?: Yes Referral Source: Self/Family/Friend Insurance type: [employer]     Crisis Care Plan Living Arrangements: Other (Comment), Alone (Independent living facility) Legal Guardian: Other: (Self) Name of Psychiatrist: None Name of Therapist: None  Education Status Is patient currently in school?: No Current Grade: NA Highest grade of school patient has completed: 12 Name of school: NA Contact person: NA  Risk to self with the past 6 months Suicidal Ideation: Yes-Currently Present Has patient been a risk to self within the past 6 months prior to admission? : No Suicidal Intent: No Has patient had any suicidal intent within the past 6 months prior to admission? : No Is patient at risk for suicide?: No Suicidal Plan?: No Has patient had any suicidal plan within the past 6 months prior to admission? : No Access to Means: No What has been your use of drugs/alcohol within the last 12 months?: Pt denies Previous Attempts/Gestures: No How many  times?: 0 Other Self Harm Risks: None identified Triggers for Past Attempts: None known Intentional Self Injurious Behavior: None Family Suicide History: No Recent stressful life event(s): Other (Comment) (None identified) Persecutory voices/beliefs?: No Depression: Yes Depression Symptoms: Despondent, Tearfulness, Isolating, Fatigue, Guilt, Loss of interest in usual pleasures, Feeling worthless/self pity Substance abuse history and/or treatment for substance abuse?: No Suicide prevention information given to non-admitted patients: Not applicable  Risk to Others within the past 6 months Homicidal Ideation: No Does patient have any lifetime risk of violence toward others beyond the six months prior to admission? : No Thoughts of Harm to Others: No Current Homicidal Intent: No Current Homicidal  Plan: No Access to Homicidal Means: No Identified Victim: None History of harm to others?: No Assessment of Violence: None Noted Violent Behavior Description: Pt denies history of violence Does patient have access to weapons?: No Criminal Charges Pending?: No Does patient have a court date: No Is patient on probation?: No  Psychosis Hallucinations: None noted Delusions: None noted  Mental Status Report Appearance/Hygiene: In scrubs Eye Contact: Good Motor Activity: Unremarkable Speech: Logical/coherent Level of Consciousness: Alert Mood: Anxious Affect: Anxious Anxiety Level: Panic Attacks Panic attack frequency: Daily Most recent panic attack: Today Thought Processes: Coherent, Relevant Judgement: Unimpaired Orientation: Person, Place, Time, Situation, Appropriate for developmental age Obsessive Compulsive Thoughts/Behaviors: None  Cognitive Functioning Concentration: Normal Memory: Recent Intact, Remote Intact IQ: Average Insight: Good Impulse Control: Good Appetite: Poor Weight Loss: 0 Weight Gain: 0 Sleep: Decreased Total Hours of Sleep: 6 Vegetative Symptoms: Staying in bed, Decreased grooming  ADLScreening [facility] Assessment Services) Patient's cognitive ability adequate to safely complete daily activities?: Yes Patient able to express need for assistance with ADLs?: Yes Independently performs ADLs?: Yes (appropriate for developmental age)  Prior Inpatient Therapy Prior Inpatient Therapy: No Prior Therapy Dates: NA Prior Therapy Facilty/Provider(s): NA Reason for Treatment: NA  Prior Outpatient Therapy Prior Outpatient Therapy: Yes Prior Therapy Dates: 2017 Prior Therapy Facilty/Provider(s): Psychiatrist in [facility] Reason for Treatment: Anxiety Does patient have an ACCT team?: No Does patient have Intensive In-House Services?  : No Does patient have [facility] services? : No Does patient have P4CC services?: No  ADL Screening (condition at time  of admission) Patient's cognitive ability adequate to safely complete daily activities?: Yes Is the patient deaf or have difficulty hearing?: No Does the patient have difficulty seeing, even when wearing glasses/contacts?: No Does the patient have difficulty concentrating, remembering, or making decisions?: No Patient able to express need for assistance with ADLs?: Yes Does the patient have difficulty dressing or bathing?: No Independently performs ADLs?: Yes (appropriate for developmental age) Does the patient have difficulty walking or climbing stairs?: No Weakness of Legs: None Weakness of Arms/Hands: None       Abuse/Neglect Assessment (Assessment to be complete while patient is alone) Physical Abuse: Denies Verbal Abuse: Denies Sexual Abuse: Denies Exploitation of patient/patient's resources: Denies Self-Neglect: Denies     Merchant navy officer (For Healthcare) Does patient have an advance directive?: No Type of Advance Directive: Healthcare Power of Lindy, Living will, Out of facility DNR (pink MOST or yellow form)    Additional Information 1:1 In Past 12 Months?: No CIRT Risk: No Elopement Risk: No Does patient have medical clearance?: Yes     Disposition: Fransico Michael, [facility], confirms adult unit is at capacity. Pt will be evaluated by psychiatry later this morning. Notified D. Judd Lien, MD and Liborio Nixon, RN of recommendation.  Disposition Initial Assessment Completed for  this Encounter: Yes Disposition of Patient: Other dispositions Other disposition(s): Other (Comment)   Pamalee Leyden, [facility], [facility], [facility] Triage Specialist [phone]  Patsy Baltimore, Harlin Rain 04/14/2016 4:53 AM

## 2016-04-14 NOTE — ED Provider Notes (Signed)
Home meds reordered. Hx of prolonged QT. EKG today with QTc of 548 ms. Remeron and benadryl not ordered for this reason.    Raeford Razor, MD 04/14/16 [phone]

## 2016-04-15 ENCOUNTER — Other Ambulatory Visit: Payer: Self-pay | Admitting: Family Medicine

## 2016-04-15 DIAGNOSIS — I4891 Unspecified atrial fibrillation: Secondary | ICD-10-CM

## 2016-04-15 DIAGNOSIS — F411 Generalized anxiety disorder: Secondary | ICD-10-CM

## 2016-04-15 DIAGNOSIS — R531 Weakness: Secondary | ICD-10-CM | POA: Diagnosis not present

## 2016-04-15 DIAGNOSIS — J439 Emphysema, unspecified: Secondary | ICD-10-CM | POA: Diagnosis not present

## 2016-04-15 DIAGNOSIS — I480 Paroxysmal atrial fibrillation: Secondary | ICD-10-CM

## 2016-04-17 ENCOUNTER — Encounter: Payer: Self-pay | Admitting: Family Medicine

## 2016-04-17 ENCOUNTER — Other Ambulatory Visit: Payer: Self-pay

## 2016-04-17 DIAGNOSIS — R531 Weakness: Secondary | ICD-10-CM | POA: Diagnosis not present

## 2016-04-17 DIAGNOSIS — J439 Emphysema, unspecified: Secondary | ICD-10-CM | POA: Diagnosis not present

## 2016-04-17 DIAGNOSIS — I4891 Unspecified atrial fibrillation: Secondary | ICD-10-CM | POA: Diagnosis not present

## 2016-04-17 NOTE — Progress Notes (Signed)
Cardiology attempted to contact patient 3 times as part of referral. No response.

## 2016-04-17 NOTE — Patient Outreach (Signed)
[facility]) Care Management  04/17/2016  [name] 1951-08-15 [id number]  REFERRAL SOURCE: Primary MD referral REFERRAL REASON:  Multiple emergency department visits due to atrial fib and anxiety  Telephone call to patient regarding primary MD referral. Unable to reach patient.  HIPAA compliant voice message left with call back phone number.   George Ina RN,BSN,CCM [facility] Telephonic  [phone]

## 2016-04-18 DIAGNOSIS — R531 Weakness: Secondary | ICD-10-CM | POA: Diagnosis not present

## 2016-04-18 DIAGNOSIS — I4891 Unspecified atrial fibrillation: Secondary | ICD-10-CM | POA: Diagnosis not present

## 2016-04-18 DIAGNOSIS — J439 Emphysema, unspecified: Secondary | ICD-10-CM | POA: Diagnosis not present

## 2016-04-20 DIAGNOSIS — F329 Major depressive disorder, single episode, unspecified: Secondary | ICD-10-CM | POA: Diagnosis not present

## 2016-04-22 DIAGNOSIS — F17200 Nicotine dependence, unspecified, uncomplicated: Secondary | ICD-10-CM | POA: Diagnosis not present

## 2016-04-22 DIAGNOSIS — E785 Hyperlipidemia, unspecified: Secondary | ICD-10-CM | POA: Diagnosis not present

## 2016-04-22 DIAGNOSIS — F329 Major depressive disorder, single episode, unspecified: Secondary | ICD-10-CM | POA: Diagnosis not present

## 2016-04-22 DIAGNOSIS — N183 Chronic kidney disease, stage 3 (moderate): Secondary | ICD-10-CM | POA: Diagnosis not present

## 2016-04-23 ENCOUNTER — Ambulatory Visit: Payer: Self-pay

## 2016-04-24 ENCOUNTER — Other Ambulatory Visit: Payer: Self-pay

## 2016-04-24 NOTE — Patient Outreach (Signed)
[facility]) Care Management  04/24/2016  [name] [birth year] [id number]  REFERRAL SOURCE: Ambulatory referral to [facility] care management REFERRAL REASON: Consult for multiple emergency department visits due to Afib and anxiety.  Second telephone call to patient regarding referral.  Unable to reach patient. HIPAA compliant voice message left with call back phone number.   PLAN:  RNCM will attempt 3rd telephone call to patient within 1 week.  George Ina RN,BSN,CCM [facility] Telephonic  [phone]

## 2016-04-25 ENCOUNTER — Encounter (HOSPITAL_COMMUNITY): Payer: Self-pay | Admitting: Psychiatry

## 2016-04-25 ENCOUNTER — Ambulatory Visit (INDEPENDENT_AMBULATORY_CARE_PROVIDER_SITE_OTHER): Payer: 59 | Admitting: Psychiatry

## 2016-04-25 ENCOUNTER — Other Ambulatory Visit: Payer: Self-pay

## 2016-04-25 VITALS — BP 128/74 | HR 66 | Ht 64.0 in | Wt 172.0 lb

## 2016-04-25 DIAGNOSIS — F411 Generalized anxiety disorder: Secondary | ICD-10-CM | POA: Diagnosis not present

## 2016-04-25 DIAGNOSIS — F331 Major depressive disorder, recurrent, moderate: Secondary | ICD-10-CM

## 2016-04-25 DIAGNOSIS — F410 Panic disorder [episodic paroxysmal anxiety] without agoraphobia: Secondary | ICD-10-CM | POA: Diagnosis not present

## 2016-04-25 MED ORDER — DIAZEPAM 2 MG PO TABS: 2.0000 mg | ORAL_TABLET | Freq: Three times a day (TID) | ORAL | 0 refills | Status: DC | PRN

## 2016-04-25 NOTE — Progress Notes (Signed)
Psychiatric Initial Adult Assessment   Patient Identification: DAJANE VALLI MRN:  [id number] Date of Evaluation:  04/25/2016 Referral Source: [facility] For Follow up Chief Complaint:   Chief Complaint    Establish Care     Visit Diagnosis:    ICD-9-CM ICD-10-CM   1. Moderate episode of recurrent major depressive disorder (HCC) 296.32 F33.1   2. Panic disorder 300.01 F41.0   3. GAD (generalized anxiety disorder) 300.02 F41.1     History of Present Illness:  65 years old currently divorced Caucasian female referred by hospital for follow-up.  She was admitted in the hospital end of July for depression and suicidal thoughts apparently she has cut down or stopped her medication Valium and Zoloft says that she was transitioning into BuSpar but it didn't go through well. She started having panic symptoms and anxiety that was apparently making her more cautious that her A. fib make it worse. She ended up in the hospital her medication adjusted in she was started back on Valium 2.5 mg 3 times a day she was also started on Zyprexa 5 mg that has helped  Aggravating factor: It is that the time of discharge she was not able to get her phone and her belongings back that added to her stress and anxiety she is communicating with the hospital. Past history of difficult marriages. Modifying factors; her son, her neighbors, watching movies Apparently she's been taking tablets of 5 mg 3 times a day says that's how it was written. Although the notes from the discharge states take half a tablet that is total dose of 2.5 mg 3 times a day. She does endorse excessive worries, prevention she worries if she is not currently taking medication she may have another panic attack Her panic start at age 40 when she had first one and since then she had been on medications She has suffered from depression including withdrawn depression sadness related with her husband leaving her in 89. Her second husband was an  alcoholic she could not deal with him because he was probably using drugs according to her.  Does not endorse psychotic symptoms or manic symptoms currently or in the past Denies substance abuse   Associated Signs/Symptoms: Depression Symptoms:  fatigue, anxiety, (Hypo) Manic Symptoms:  Distractibility, Anxiety Symptoms:  Excessive Worry, Psychotic Symptoms:  denies PTSD Symptoms: NA  Past Psychiatric History: Admitted in Hospital in 1987 when her first husband left Age 65 she had her first panic attack since then she has followed a different psychiatrist [facility]  Previous Psychotropic Medications: Yes   Substance Abuse History in the last 12 months:  No.  Consequences of Substance Abuse: NA  Past Medical History:  Past Medical History:  Diagnosis Date  . Allergy   . Anxiety   . Anxiety disorder   . Arthritis of knee    Bilateral  . Atrial fibrillation (HCC)    a) 2D echo 09/11/12: LVEF 55-60%, mild LVH, mld LA dilatation  . Automatic implantable cardiac defibrillator in situ    [employer] ICD  . CAD (coronary artery disease)   . Cardiac arrest (HCC)   . Cerebrovascular disease   . COPD (chronic obstructive pulmonary disease) (HCC)   . Coronary artery disease    non obstructive  . Depression   . Depression   . Fibromuscular dysplasia (HCC)   . HLD (hyperlipidemia)   . HTN (hypertension)   . Long QT syndrome   . Migraine   . Migraine  headache   . Seizure disorder (HCC)   . Seizures (HCC)   . Stroke [facility])    no deficits  . Tobacco abuse   . Tobacco abuse   . Ventricular fibrillation [facility])     Past Surgical History:  Procedure Laterality Date  . APPENDECTOMY    . BREAST LUMPECTOMY    . CARDIAC DEFIBRILLATOR PLACEMENT    . CHOLECYSTECTOMY    . COSMETIC SURGERY    . PACEMAKER INSERTION  05/01/2003   ICD, implantation of a Guidant single chamber defibrillator, Doylene Canning. Ladona Ridgel MD  . TUBAL LIGATION      Family  Psychiatric History: Mother anxiety; Most of mothers side has anxiety  Family History:  Family History  Problem Relation Age of Onset  . Dementia Mother   . Stroke Mother     Multiple  . Aneurysm Sister     Brain  . Stroke Sister   . Colon cancer Sister   . Cancer Sister   . Uterine cancer Other     Grandmother    Social History:   Social History   Social History  . Marital status: Divorced    Spouse name: N/A  . Number of children: N/A  . Years of education: N/A   Occupational History  . Disabled    Social History Main Topics  . Smoking status: Current Some Day Smoker    Packs/day: 0.25    Years: 40.00    Types: Cigarettes  . Smokeless tobacco: Never Used     Comment: almost a pack a week  . Alcohol use No  . Drug use: No  . Sexual activity: No   Other Topics Concern  . None   Social History Narrative   Lives in [place]   On disability for her osteoarthritis   Recently estranged from her husband    Additional Social History: Grew up with her parents saying growing up was good she lived in the form and it was wonderful. 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 She has 2 grown kids.   Allergies:   Allergies  Allergen Reactions  . Azithromycin Other (See Comments)    Hypotension   . Ibuprofen Other (See Comments)    Hypotension. Takes ASA without problem   . Codeine Rash  . Demerol [Meperidine] Rash    Metabolic Disorder Labs: No results found for: HGBA1C, MPG No results found for: PROLACTIN Lab Results  Component Value Date   CHOL 205 (H) 01/05/2012   TRIG 117 01/05/2012   HDL 39 (L) 01/05/2012   CHOLHDL 5.5 CALC 09/27/2008   VLDL 23 01/05/2012   LDLCALC 143 (H) 01/05/2012     Current Medications: Current Outpatient Prescriptions  Medication Sig Dispense Refill  . acetaminophen (TYLENOL) 500 MG tablet Take 500-1,000 mg by mouth every 6 (six) hours as needed for headache (pain).      Marland Kitchen amoxicillin (AMOXIL) 500 MG capsule Take 1 capsule (500 mg total) by mouth 3 (three) times daily. 30 capsule 0  . cholecalciferol (VITAMIN D) 1000 units tablet Take by mouth.    Melene Muller ON 05/06/2016] diazepam (VALIUM) 2 MG tablet Take 1 tablet (2 mg total) by mouth 3 (three) times daily as needed for anxiety. 30 tablet 0  . dicyclomine (BENTYL) 20 MG tablet Take by mouth.    . diphenhydrAMINE (BENADRYL) 25 MG tablet Take 50 mg by mouth every 6 (six) hours as needed for allergies  or sleep. Reported on 01/09/2016    . fluticasone (FLONASE) 50 MCG/ACT nasal spray Place 2 sprays into both nostrils daily as needed for allergies.     . metoprolol tartrate (LOPRESSOR) 25 MG tablet Take 1 tablet (25 mg total) by mouth 2 (two) times daily. OK to take an extra tablet daily as needed. 45 tablet 11  . OLANZapine (ZYPREXA) 5 MG tablet Take by mouth.    Marland Kitchen oxymetazoline (AFRIN) 0.05 % nasal spray Place 2 sprays into both nostrils 2 (two) times daily as needed for congestion. Reported on 12/04/2015    . Rivaroxaban (XARELTO) 15 MG TABS tablet Take 1 tablet (15 mg total) by mouth at bedtime. Please call and schedule an appointment 30 tablet 3  . simvastatin (ZOCOR) 20 MG tablet Take 1 tablet (20 mg total) by mouth at bedtime. 90 tablet 3  . traZODone (DESYREL) 50 MG tablet Take by mouth.     No current facility-administered medications for this visit.     Neurologic: Headache: No Seizure: No Paresthesias:No  Musculoskeletal: Strength & Muscle Tone: within normal limits Gait & Station: normal Patient leans: no lean  Psychiatric Specialty Exam: Review of Systems  Constitutional: Negative for fever.  Cardiovascular: Negative for chest pain.  Psychiatric/Behavioral: Negative for depression and suicidal ideas. The patient is nervous/anxious.     Blood pressure 128/74, pulse 66, height  (1.626 m), weight 172 lb (78 kg), SpO2 95 %.Body mass index is 29.52 kg/m.  General Appearance: Casual  Eye  Contact:  Fair  Speech:  Normal Rate  Volume:  Normal  Mood:  Euthymic  Affect:  Constricted  Thought Process:  Goal Directed  Orientation:  Full (Time, Place, and Person)  Thought Content:  Rumination  Suicidal Thoughts:  No  Homicidal Thoughts:  No  Memory:  Immediate;   Fair Recent;   Fair  Judgement:  Fair  Insight:  Shallow  Psychomotor Activity:  Normal  Concentration:  Concentration: Fair and Attention Span: Fair  Recall:  [employer] of Knowledge:Fair  Language: Fair  Akathisia:  Negative  Handed:  Right  AIMS (if indicated):    Assets:  Desire for Improvement  ADL's:  Intact  Cognition: WNL  Sleep:  Fair on meds    Treatment Plan Summary: Medication management and Plan as follows  Major depression, recurrent: severity improving. She will continue zoloft  qd . Has meds for now GAD./Panic disorder; cotinue zoloft She is advised to take  (half tablet) tid not full tablet. Explained it is longer acting and she should not have any withdrawals but to lower the dose back to 2.5mg  tid slowly in next 3 days. I will provide down the 2 mg tablets 3 times a day so that she can further lowered to contact dose and does not have to worry about taking half a tablet. She can start that dose in next 10 days. We will then continue 203 times a day for a period of a few more months considering she has A. fib and do not want to taper down unless tolerated. Insomnia: prn trazadone. But zyprexa helping as well.  No tremors Denies drug use.  Explained the risk associated with benzodiazepine and its independence. Explained not to call in early for prescription and not to increase the dosages. Explained effects of an into memory and causes of tiredness, sedation as well. More than 50% time spent in counseling and coordination of care including prison education Call 911 or report local emergency room for  any urgent concerns or suicidal thoughts Follow-up with the primary care and medical  providers as per recommendation.  Follow-up in 4 weeks or earlier if needed  Thresa Ross, MD 8/4/20179:31 AM

## 2016-04-25 NOTE — Patient Outreach (Signed)
[facility]) Care Management  04/25/2016  [name] 11-21-1950 [id number]  REFERRAL SOURCE: Primary MD REFERRAL REASON:  Multiple ED visits due to Afib and anxiety   Telephone call to patient for screening.  Unable to reach patient or leave voice mail.  Message states number called not reachable.  Call was made to patients primary MD office. Spoke with Victorino Dike and confirmed patients contact phone number of [phone].  Alternative contact numbers given by Victorino Dike for patients son, [name] [phone] and daughter, [name] [phone].  Alternate numbers called.  Voice mail answered.    Plan: RNCM will attempt to reach patient at alternate numbers. If unable to reach patient at alternate numbers will attempt outreach #3 within 1 week.   George Ina RN,BSN,CCM [facility] Telephonic  [phone]

## 2016-04-25 NOTE — Patient Instructions (Signed)
Do not take valium more then 2.5mg  tid for next week.  Start 2mg  tid after that. Prescription post dated for next 10 days.

## 2016-04-29 ENCOUNTER — Other Ambulatory Visit: Payer: Self-pay

## 2016-04-29 NOTE — Patient Outreach (Signed)
[facility]) Care Management  04/29/2016  [name] 10/10/1950 [id number]   Third telephone call attempt to patient.  Unable to reach patient or leave voice message..  Message states "the number you are trying to reach is not reachable."  PLAN;  RNCM will send patient outreach letter to attempt contact.   [name] RN,BSN,CCM [facility] Telephonic  [phone]

## 2016-04-30 ENCOUNTER — Telehealth (HOSPITAL_COMMUNITY): Payer: Self-pay | Admitting: *Deleted

## 2016-04-30 NOTE — Telephone Encounter (Signed)
Received telephone call from Burlene ArntEmily, [facility] requesting an early refill for Valium 5mg . Per Pharmacy, pt received a 20 day supply of Valium 5mg , #30 on [facility]22/16 [facility]. Per Dr. Gilmore LarocheAkhtar, request to refill medication early is denied.  Per pharmacy, they will notify pt of refill status. Pt is schedule for a f/u appt on 05/19/16.

## 2016-05-08 DIAGNOSIS — I4901 Ventricular fibrillation: Secondary | ICD-10-CM | POA: Diagnosis not present

## 2016-05-08 DIAGNOSIS — R000 Tachycardia, unspecified: Secondary | ICD-10-CM | POA: Diagnosis not present

## 2016-05-08 DIAGNOSIS — J449 Chronic obstructive pulmonary disease, unspecified: Secondary | ICD-10-CM | POA: Diagnosis not present

## 2016-05-08 DIAGNOSIS — Z7901 Long term (current) use of anticoagulants: Secondary | ICD-10-CM | POA: Diagnosis not present

## 2016-05-08 DIAGNOSIS — R002 Palpitations: Secondary | ICD-10-CM | POA: Diagnosis not present

## 2016-05-08 DIAGNOSIS — E785 Hyperlipidemia, unspecified: Secondary | ICD-10-CM | POA: Diagnosis not present

## 2016-05-08 DIAGNOSIS — Z886 Allergy status to analgesic agent status: Secondary | ICD-10-CM | POA: Diagnosis not present

## 2016-05-08 DIAGNOSIS — Z79899 Other long term (current) drug therapy: Secondary | ICD-10-CM | POA: Diagnosis not present

## 2016-05-08 DIAGNOSIS — I252 Old myocardial infarction: Secondary | ICD-10-CM | POA: Diagnosis not present

## 2016-05-08 DIAGNOSIS — I2510 Atherosclerotic heart disease of native coronary artery without angina pectoris: Secondary | ICD-10-CM | POA: Diagnosis not present

## 2016-05-08 DIAGNOSIS — R569 Unspecified convulsions: Secondary | ICD-10-CM | POA: Diagnosis not present

## 2016-05-08 DIAGNOSIS — M1710 Unilateral primary osteoarthritis, unspecified knee: Secondary | ICD-10-CM | POA: Diagnosis not present

## 2016-05-08 DIAGNOSIS — Z881 Allergy status to other antibiotic agents status: Secondary | ICD-10-CM | POA: Diagnosis not present

## 2016-05-08 DIAGNOSIS — R531 Weakness: Secondary | ICD-10-CM | POA: Diagnosis not present

## 2016-05-08 DIAGNOSIS — Z885 Allergy status to narcotic agent status: Secondary | ICD-10-CM | POA: Diagnosis not present

## 2016-05-08 DIAGNOSIS — Z8673 Personal history of transient ischemic attack (TIA), and cerebral infarction without residual deficits: Secondary | ICD-10-CM | POA: Diagnosis not present

## 2016-05-08 DIAGNOSIS — I482 Chronic atrial fibrillation: Secondary | ICD-10-CM | POA: Diagnosis not present

## 2016-05-08 DIAGNOSIS — I10 Essential (primary) hypertension: Secondary | ICD-10-CM | POA: Diagnosis not present

## 2016-05-13 ENCOUNTER — Other Ambulatory Visit: Payer: Self-pay

## 2016-05-13 NOTE — Patient Outreach (Signed)
[facility]) Care Management  05/13/2016  Helaine ChessKatrina M Nigh 07/11/1951 [id number]   REFERRAL SOURCE; primary MD REFERRAL REASON;  Multiple ED visits due to Afib and anxiety.  No response from patient after 3 telephone call and outreach letter.   PLAN; RNCM will refer patient to Tomasita CrumbleLavelda Comer to close due to being unable to contact patient. RNCM will notify patients primary MD of closure.   George InaDavina Sadrac Zeoli RN,BSN,CCM [facility] Telephonic  [phone]

## 2016-05-15 ENCOUNTER — Encounter: Payer: Self-pay | Admitting: Family Medicine

## 2016-05-18 DIAGNOSIS — R112 Nausea with vomiting, unspecified: Secondary | ICD-10-CM | POA: Diagnosis not present

## 2016-05-18 DIAGNOSIS — I4891 Unspecified atrial fibrillation: Secondary | ICD-10-CM | POA: Diagnosis not present

## 2016-05-18 DIAGNOSIS — Z791 Long term (current) use of non-steroidal anti-inflammatories (NSAID): Secondary | ICD-10-CM | POA: Diagnosis not present

## 2016-05-18 DIAGNOSIS — Z7951 Long term (current) use of inhaled steroids: Secondary | ICD-10-CM | POA: Diagnosis not present

## 2016-05-18 DIAGNOSIS — E785 Hyperlipidemia, unspecified: Secondary | ICD-10-CM | POA: Diagnosis not present

## 2016-05-18 DIAGNOSIS — M179 Osteoarthritis of knee, unspecified: Secondary | ICD-10-CM | POA: Diagnosis not present

## 2016-05-18 DIAGNOSIS — K529 Noninfective gastroenteritis and colitis, unspecified: Secondary | ICD-10-CM | POA: Diagnosis not present

## 2016-05-18 DIAGNOSIS — Z8673 Personal history of transient ischemic attack (TIA), and cerebral infarction without residual deficits: Secondary | ICD-10-CM | POA: Diagnosis not present

## 2016-05-18 DIAGNOSIS — I679 Cerebrovascular disease, unspecified: Secondary | ICD-10-CM | POA: Diagnosis not present

## 2016-05-18 DIAGNOSIS — I773 Arterial fibromuscular dysplasia: Secondary | ICD-10-CM | POA: Diagnosis not present

## 2016-05-18 DIAGNOSIS — Z881 Allergy status to other antibiotic agents status: Secondary | ICD-10-CM | POA: Diagnosis not present

## 2016-05-18 DIAGNOSIS — R1111 Vomiting without nausea: Secondary | ICD-10-CM | POA: Diagnosis not present

## 2016-05-18 DIAGNOSIS — Z885 Allergy status to narcotic agent status: Secondary | ICD-10-CM | POA: Diagnosis not present

## 2016-05-18 DIAGNOSIS — Z79899 Other long term (current) drug therapy: Secondary | ICD-10-CM | POA: Diagnosis not present

## 2016-05-18 DIAGNOSIS — I252 Old myocardial infarction: Secondary | ICD-10-CM | POA: Diagnosis not present

## 2016-05-18 DIAGNOSIS — Z9049 Acquired absence of other specified parts of digestive tract: Secondary | ICD-10-CM | POA: Diagnosis not present

## 2016-05-18 DIAGNOSIS — I4901 Ventricular fibrillation: Secondary | ICD-10-CM | POA: Diagnosis not present

## 2016-05-18 DIAGNOSIS — R9431 Abnormal electrocardiogram [ECG] [EKG]: Secondary | ICD-10-CM | POA: Diagnosis not present

## 2016-05-18 DIAGNOSIS — Z888 Allergy status to other drugs, medicaments and biological substances status: Secondary | ICD-10-CM | POA: Diagnosis not present

## 2016-05-18 DIAGNOSIS — I10 Essential (primary) hypertension: Secondary | ICD-10-CM | POA: Diagnosis not present

## 2016-05-18 DIAGNOSIS — I2510 Atherosclerotic heart disease of native coronary artery without angina pectoris: Secondary | ICD-10-CM | POA: Diagnosis not present

## 2016-05-18 DIAGNOSIS — Z7901 Long term (current) use of anticoagulants: Secondary | ICD-10-CM | POA: Diagnosis not present

## 2016-05-18 DIAGNOSIS — Z95810 Presence of automatic (implantable) cardiac defibrillator: Secondary | ICD-10-CM | POA: Diagnosis not present

## 2016-05-18 DIAGNOSIS — J449 Chronic obstructive pulmonary disease, unspecified: Secondary | ICD-10-CM | POA: Diagnosis not present

## 2016-05-18 DIAGNOSIS — R197 Diarrhea, unspecified: Secondary | ICD-10-CM | POA: Diagnosis not present

## 2016-05-18 DIAGNOSIS — R569 Unspecified convulsions: Secondary | ICD-10-CM | POA: Diagnosis not present

## 2016-05-19 ENCOUNTER — Ambulatory Visit (HOSPITAL_COMMUNITY): Payer: Self-pay | Admitting: Psychiatry

## 2016-05-20 ENCOUNTER — Ambulatory Visit (HOSPITAL_COMMUNITY)
Admission: RE | Admit: 2016-05-20 | Discharge: 2016-05-20 | Disposition: A | Discharge status: Home / Self Care | Payer: Medicare Other | Attending: Psychiatry | Admitting: Psychiatry

## 2016-05-20 ENCOUNTER — Emergency Department (HOSPITAL_COMMUNITY)
Admission: EM | Admit: 2016-05-20 | Discharge: 2016-05-22 | Disposition: A | Discharge status: Other Acute Inpatient Hospital | Payer: Medicare Other | Attending: Emergency Medicine | Admitting: Emergency Medicine

## 2016-05-20 ENCOUNTER — Emergency Department (HOSPITAL_COMMUNITY): Payer: Medicare Other

## 2016-05-20 ENCOUNTER — Encounter (HOSPITAL_COMMUNITY): Payer: Self-pay | Admitting: Emergency Medicine

## 2016-05-20 DIAGNOSIS — F332 Major depressive disorder, recurrent severe without psychotic features: Secondary | ICD-10-CM | POA: Insufficient documentation

## 2016-05-20 DIAGNOSIS — I10 Essential (primary) hypertension: Secondary | ICD-10-CM | POA: Insufficient documentation

## 2016-05-20 DIAGNOSIS — I252 Old myocardial infarction: Secondary | ICD-10-CM | POA: Insufficient documentation

## 2016-05-20 DIAGNOSIS — R569 Unspecified convulsions: Secondary | ICD-10-CM | POA: Insufficient documentation

## 2016-05-20 DIAGNOSIS — F411 Generalized anxiety disorder: Secondary | ICD-10-CM | POA: Diagnosis not present

## 2016-05-20 DIAGNOSIS — I2510 Atherosclerotic heart disease of native coronary artery without angina pectoris: Secondary | ICD-10-CM | POA: Insufficient documentation

## 2016-05-20 DIAGNOSIS — E785 Hyperlipidemia, unspecified: Secondary | ICD-10-CM | POA: Insufficient documentation

## 2016-05-20 DIAGNOSIS — F32A Depression, unspecified: Secondary | ICD-10-CM

## 2016-05-20 DIAGNOSIS — F418 Other specified anxiety disorders: Secondary | ICD-10-CM | POA: Diagnosis not present

## 2016-05-20 DIAGNOSIS — Z720 Tobacco use: Secondary | ICD-10-CM | POA: Insufficient documentation

## 2016-05-20 DIAGNOSIS — I480 Paroxysmal atrial fibrillation: Secondary | ICD-10-CM | POA: Insufficient documentation

## 2016-05-20 DIAGNOSIS — J449 Chronic obstructive pulmonary disease, unspecified: Secondary | ICD-10-CM | POA: Diagnosis not present

## 2016-05-20 DIAGNOSIS — Q898 Other specified congenital malformations: Secondary | ICD-10-CM | POA: Diagnosis not present

## 2016-05-20 DIAGNOSIS — F17210 Nicotine dependence, cigarettes, uncomplicated: Secondary | ICD-10-CM | POA: Insufficient documentation

## 2016-05-20 DIAGNOSIS — F419 Anxiety disorder, unspecified: Secondary | ICD-10-CM | POA: Diagnosis present

## 2016-05-20 DIAGNOSIS — Z8673 Personal history of transient ischemic attack (TIA), and cerebral infarction without residual deficits: Secondary | ICD-10-CM | POA: Diagnosis not present

## 2016-05-20 DIAGNOSIS — I498 Other specified cardiac arrhythmias: Secondary | ICD-10-CM | POA: Insufficient documentation

## 2016-05-20 DIAGNOSIS — Z95810 Presence of automatic (implantable) cardiac defibrillator: Secondary | ICD-10-CM | POA: Insufficient documentation

## 2016-05-20 DIAGNOSIS — I4891 Unspecified atrial fibrillation: Secondary | ICD-10-CM

## 2016-05-20 DIAGNOSIS — Z79899 Other long term (current) drug therapy: Secondary | ICD-10-CM | POA: Diagnosis not present

## 2016-05-20 DIAGNOSIS — R079 Chest pain, unspecified: Secondary | ICD-10-CM | POA: Diagnosis not present

## 2016-05-20 DIAGNOSIS — Z01818 Encounter for other preprocedural examination: Secondary | ICD-10-CM | POA: Diagnosis present

## 2016-05-20 DIAGNOSIS — F329 Major depressive disorder, single episode, unspecified: Secondary | ICD-10-CM

## 2016-05-20 DIAGNOSIS — R45851 Suicidal ideations: Secondary | ICD-10-CM | POA: Diagnosis not present

## 2016-05-20 LAB — RAPID URINE DRUG SCREEN, HOSP PERFORMED
AMPHETAMINES: NOT DETECTED
BARBITURATES: NOT DETECTED
BENZODIAZEPINES: POSITIVE — AB
Cocaine: NOT DETECTED
Opiates: NOT DETECTED
TETRAHYDROCANNABINOL: NOT DETECTED

## 2016-05-20 LAB — COMPREHENSIVE METABOLIC PANEL
ALK PHOS: 87 U/L (ref 38–126)
ALT: 13 U/L — AB (ref 14–54)
AST: 26 U/L (ref 15–41)
Albumin: 4.4 g/dL (ref 3.5–5.0)
Anion gap: 9 (ref 5–15)
BUN: 20 mg/dL (ref 6–20)
CALCIUM: 10.3 mg/dL (ref 8.9–10.3)
CO2: 23 mmol/L (ref 22–32)
CREATININE: 1.81 mg/dL — AB (ref 0.44–1.00)
Chloride: 104 mmol/L (ref 101–111)
GFR calc non Af Amer: 28 mL/min — ABNORMAL LOW (ref >60)
GFR, EST AFRICAN AMERICAN: 32 mL/min — AB (ref >60)
GLUCOSE: 99 mg/dL (ref 65–99)
Potassium: 3.4 mmol/L — ABNORMAL LOW (ref 3.5–5.1)
SODIUM: 136 mmol/L (ref 135–145)
Total Bilirubin: 0.8 mg/dL (ref 0.3–1.2)
Total Protein: 8.3 g/dL — ABNORMAL HIGH (ref 6.5–8.1)

## 2016-05-20 LAB — I-STAT TROPONIN, ED: TROPONIN I, POC: 0.01 ng/mL (ref 0.00–0.08)

## 2016-05-20 LAB — CBC
HCT: 41.5 % (ref 36.0–46.0)
HEMOGLOBIN: 14.8 g/dL (ref 12.0–15.0)
MCH: 32.7 pg (ref 26.0–34.0)
MCHC: 35.7 g/dL (ref 30.0–36.0)
MCV: 91.6 fL (ref 78.0–100.0)
Platelets: 296 10*3/uL (ref 150–400)
RBC: 4.53 MIL/uL (ref 3.87–5.11)
RDW: 12.4 % (ref 11.5–15.5)
WBC: 9.2 10*3/uL (ref 4.0–10.5)

## 2016-05-20 LAB — SALICYLATE LEVEL: Salicylate Lvl: <4 mg/dL (ref 2.8–30.0)

## 2016-05-20 LAB — ACETAMINOPHEN LEVEL: Acetaminophen (Tylenol), Serum: <10 ug/mL — ABNORMAL LOW (ref 10–30)

## 2016-05-20 LAB — ETHANOL

## 2016-05-20 MED ORDER — ASPIRIN 81 MG PO CHEW
324.0000 mg | CHEWABLE_TABLET | Freq: Once | ORAL | Status: AC
  Administered 2016-05-20: 324 mg via ORAL
  Filled 2016-05-20: qty 4

## 2016-05-20 MED ORDER — DILTIAZEM HCL 25 MG/5ML IV SOLN
10.0000 mg | Freq: Once | INTRAVENOUS | Status: AC
  Administered 2016-05-20: 10 mg via INTRAVENOUS
  Filled 2016-05-20: qty 5

## 2016-05-20 MED ORDER — METOPROLOL TARTRATE 25 MG PO TABS
25.0000 mg | ORAL_TABLET | Freq: Once | ORAL | Status: AC
  Administered 2016-05-20: 25 mg via ORAL
  Filled 2016-05-20: qty 1

## 2016-05-20 MED ORDER — SODIUM CHLORIDE 0.9 % IV BOLUS (SEPSIS)
1000.0000 mL | Freq: Once | INTRAVENOUS | Status: AC
  Administered 2016-05-20: 1000 mL via INTRAVENOUS

## 2016-05-20 MED ORDER — RIVAROXABAN 15 MG PO TABS
15.0000 mg | ORAL_TABLET | Freq: Once | ORAL | Status: AC
  Administered 2016-05-20: 15 mg via ORAL
  Filled 2016-05-20: qty 1

## 2016-05-20 MED ORDER — SIMVASTATIN 20 MG PO TABS
20.0000 mg | ORAL_TABLET | Freq: Once | ORAL | Status: AC
  Administered 2016-05-20: 20 mg via ORAL
  Filled 2016-05-20: qty 1

## 2016-05-20 MED ORDER — ACETAMINOPHEN 500 MG PO TABS
1000.0000 mg | ORAL_TABLET | Freq: Once | ORAL | Status: AC
  Administered 2016-05-20: 1000 mg via ORAL
  Filled 2016-05-20: qty 2

## 2016-05-20 NOTE — ED Notes (Signed)
HR 88 on ECG monitor, not 37 on pulse oximeter.

## 2016-05-20 NOTE — ED Provider Notes (Signed)
[facility]-EMERGENCY DEPT Provider Note   CSN: [id number] Arrival date & time: 05/20/16  1821     History   Chief Complaint Chief Complaint  Patient presents with  . Medical Clearance    HPI [name] is a 65 y.o. female.  65 year old female with past medical history including atrial fibrillation, CAD, prolonged QT, CVA, COPD, anxiety/depression who presents with heart racing. The patient went to [facility] today for evaluation due to worsening depression and anxiety symptoms. On her way there, she began noticing heart racing which feels like her atrial fibrillation. She reports associated mild shortness of breath, central chest pressure, and headache; she has had all of these symptoms previously with episodes of atrial fibrillation with rapid ventricular response. She denies any visual changes, extremity numbness/weakness, fevers, vomiting, or recent illness. She is compliant with her medications. No recent changes to her medicines. No alcohol or drug use.  Regarding the patient's depression, she states that her anxiety and depression has been more severe recently which is why she went for evaluation today. She states that sometimes she does not want to live if she has to live like this. She denies any specific plan. No HI or hallucinations.   The history is provided by the patient.    Past Medical History:  Diagnosis Date  . Allergy   . Anxiety   . Anxiety disorder   . Arthritis of knee    Bilateral  . Atrial fibrillation (HCC)    a) 2D echo 09/11/12: LVEF 55-60%, mild LVH, mld LA dilatation  . Automatic implantable cardiac defibrillator in situ    [employer] ICD  . CAD (coronary artery disease)   . Cardiac arrest (HCC)   . Cerebrovascular disease   . COPD (chronic obstructive pulmonary disease) (HCC)   . Coronary artery disease    non obstructive  . Depression   . Depression   . Fibromuscular dysplasia (HCC)   . HLD (hyperlipidemia)   . HTN (hypertension)   . Long  QT syndrome   . Migraine   . Migraine headache   . Seizure disorder (HCC)   . Seizures (HCC)   . Stroke [facility])    no deficits  . Tobacco abuse   . Tobacco abuse   . Ventricular fibrillation [facility])     Patient Active Problem List   Diagnosis Date Noted  . 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 Carotid artery disease (HCC) 04/08/2016  . Benzodiazepine dependence (HCC) 02/11/2014  . Paroxysmal atrial fibrillation (HCC) 11/04/2013  . Atrial fibrillation with RVR (HCC) 09/12/2012  . History of ventricular fibrillation 09/12/2012  . Anxiety state 05/22/2009  . TOBACCO ABUSE 05/22/2009  . Major depression (HCC) 05/22/2009  . MIGRAINE HEADACHE 05/22/2009  . CAD 05/22/2009  . Long QT syndrome 05/22/2009  . CARDIAC ARREST 05/22/2009  . CEREBROVASCULAR DISEASE 05/22/2009  . COPD 05/22/2009  . DJD (degenerative joint disease) of knee 05/22/2009  . SEIZURE DISORDER 05/22/2009  . AUTOMATIC IMPLANTABLE CARDIAC DEFIBRILLATOR SITU 05/22/2009    Past Surgical History:  Procedure Laterality Date  . APPENDECTOMY    . BREAST LUMPECTOMY    . CARDIAC DEFIBRILLATOR PLACEMENT    . CHOLECYSTECTOMY    . COSMETIC SURGERY    . PACEMAKER INSERTION  05/01/2003   ICD, implantation of a Guidant single chamber defibrillator, Doylene Canning. Ladona Ridgel MD  . TUBAL LIGATION      OB History    No data available  Home Medications    Prior to Admission medications   Medication Sig Start Date End Date Taking? Authorizing Provider  acetaminophen (TYLENOL) 500 MG tablet Take 1,000 mg by mouth every 6 (six) hours as needed for mild pain, moderate pain or headache.    Yes Historical Provider, MD  cholecalciferol (VITAMIN D) 1000 units tablet Take 1,000 Units by mouth daily.   Yes Historical Provider, MD  diazepam (VALIUM) 2 MG tablet Take 1 tablet (2 mg total) by mouth 3 (three) times daily as needed for anxiety. 05/06/16 08/04/16 Yes Thresa Ross, MD  diphenhydrAMINE  (BENADRYL) 25 MG tablet Take 50 mg by mouth every 6 (six) hours as needed for allergies.    Yes Historical Provider, MD  fluticasone (FLONASE) 50 MCG/ACT nasal spray Place 2 sprays into both nostrils daily as needed for rhinitis.    Yes Historical Provider, MD  metoprolol tartrate (LOPRESSOR) 25 MG tablet Take 25 mg by mouth 2 (two) times daily. Pt is able to take an additional dose if needed for A-fib.   Yes Historical Provider, MD  Rivaroxaban (XARELTO) 15 MG TABS tablet Take 15 mg by mouth at bedtime.   Yes Historical Provider, MD  sertraline (ZOLOFT) 100 MG tablet Take 200 mg by mouth at bedtime.   Yes Historical Provider, MD  simvastatin (ZOCOR) 20 MG tablet Take 1 tablet (20 mg total) by mouth at bedtime. 06/06/15  Yes Iran Ouch, MD  traZODone (DESYREL) 50 MG tablet Take 50 mg by mouth at bedtime as needed for sleep.    Yes Historical Provider, MD  amoxicillin (AMOXIL) 500 MG capsule Take 1 capsule (500 mg total) by mouth 3 (three) times daily. Patient not taking: Reported on 05/20/2016 04/08/16   Rodolph Bong, MD    Family History Family History  Problem Relation Age of Onset  . Dementia Mother   . Stroke Mother     Multiple  . Aneurysm Sister     Brain  . Stroke Sister   . Colon cancer Sister   . Cancer Sister   . Uterine cancer Other     Grandmother    Social History Social History  Substance Use Topics  . Smoking status: Current Some Day Smoker    Packs/day: 0.25    Years: 40.00    Types: Cigarettes  . Smokeless tobacco: Never Used     Comment: almost a pack a week  . Alcohol use No     Allergies   Azithromycin; Ibuprofen; Codeine; and Demerol [meperidine]   Review of Systems Review of Systems 10 Systems reviewed and are negative for acute change except as noted in the HPI.  Physical Exam Updated Vital Signs BP 95/79   Pulse 77   Temp 98.3 F (36.8 C) (Oral)   Resp 23   SpO2 95%   Physical Exam  Constitutional: She is oriented to person, place,  and time. She appears well-developed and well-nourished. No distress.  HENT:  Head: Normocephalic and atraumatic.  Moist mucous membranes  Eyes: Conjunctivae are normal. Pupils are equal, round, and reactive to light.  Neck: Neck supple.  Cardiovascular: Normal heart sounds.  An irregularly irregular rhythm present. Tachycardia present.   No murmur heard. Pulmonary/Chest: Effort normal and breath sounds normal.  Abdominal: Soft. Bowel sounds are normal. She exhibits no distension. There is no tenderness.  Musculoskeletal: She exhibits no edema.  Neurological: She is alert and oriented to person, place, and time.  Fluent speech  Skin: Skin is warm and dry.  Psychiatric:  Mildly anxious, cooperative  Nursing note and vitals reviewed.    ED Treatments / Results  Labs (all labs ordered are listed, but only abnormal results are displayed) Labs Reviewed  COMPREHENSIVE METABOLIC PANEL - Abnormal; Notable for the following:       Result Value   Potassium 3.4 (*)    Creatinine, Ser 1.81 (*)    Total Protein 8.3 (*)    ALT 13 (*)    GFR calc non Af Amer 28 (*)    GFR calc Af Amer 32 (*)    All other components within normal limits  ACETAMINOPHEN LEVEL - Abnormal; Notable for the following:    Acetaminophen (Tylenol), Serum <10 (*)    All other components within normal limits  URINE RAPID DRUG SCREEN, HOSP PERFORMED - Abnormal; Notable for the following:    Benzodiazepines POSITIVE (*)    All other components within normal limits  CBC  SALICYLATE LEVEL  ETHANOL  I-STAT TROPOININ, ED  I-STAT TROPOININ, ED    EKG  EKG Interpretation  Date/Time:  Tuesday May 20 2016 18:55:06 EDT Ventricular Rate:  139 PR Interval:    QRS Duration: 100 QT Interval:  310 QTC Calculation: 472 R Axis:   60 Text Interpretation:  Atrial fibrillation RSR' in V1 or V2, probably normal variant Repol abnrm suggests ischemia, diffuse leads A fib with RVR new from previous; diffuse ST depression  likely rate related Confirmed by LITTLE MD, RACHEL [phone]) on 05/20/2016 7:30:37 PM       Radiology Dg Chest 2 View  Result Date: 05/20/2016 CLINICAL DATA:  Chest pain today.  History of COPD.  Smoker. EXAM: CHEST  2 VIEW COMPARISON:  04/14/2016 FINDINGS: Cardiac pacemaker. Shallow inspiration. Normal heart size and pulmonary vascularity. No focal airspace disease or consolidation in the lungs. No blunting of costophrenic angles. No pneumothorax. Mediastinal contours appear intact. Degenerative changes in the spine and shoulders. IMPRESSION: No active cardiopulmonary disease. Electronically Signed   By: Burman Nieves M.D.   On: 05/20/2016 19:28    Procedures Procedures (including critical care time)  Medications Ordered in ED Medications  acetaminophen (TYLENOL) tablet 650 mg (not administered)  aspirin chewable tablet 324 mg (324 mg Oral Given 05/20/16 1941)  diltiazem (CARDIZEM) injection 10 mg (10 mg Intravenous Given 05/20/16 2011)  acetaminophen (TYLENOL) tablet 1,000 mg (1,000 mg Oral Given 05/20/16 1940)  sodium chloride 0.9 % bolus 1,000 mL (0 mLs Intravenous Stopped 05/20/16 2325)  metoprolol tartrate (LOPRESSOR) tablet 25 mg (25 mg Oral Given 05/20/16 2324)  Rivaroxaban (XARELTO) tablet 15 mg (15 mg Oral Given 05/20/16 2324)  simvastatin (ZOCOR) tablet 20 mg (20 mg Oral Given 05/20/16 2324)  LORazepam (ATIVAN) tablet 1 mg (1 mg Oral Given 05/21/16 0025)     Initial Impression / Assessment and Plan / ED Course  I have reviewed the triage vital signs and the nursing notes.  Pertinent labs & imaging results that were available during my care of the patient were reviewed by me and considered in my medical decision making (see chart for details).  Clinical Course   Patient with history of A. Fib on medications presents with heart racing sensation that began this afternoon associated with central chest pressure and shortness of breath as well as headache. She has had all these  symptoms before related to A. fib. She was awake and alert on the exam, stable vital signs with the exception of variable heart rate in the 110s-140s. A. fib with RVR on EKG. Obtained  about labs which showed normal troponin, creatinine 1.8, negative tox labs. Gave the patient an IV fluid bolus followed by 10 mg IV Cardizem which improved her heart rate. Gave her her home evening medications including metoprolol. Chest x-ray negative acute. On reexamination, the patient was comfortable, heart rate less than 100, and her symptoms had resolved. She states that she always has these symptoms with A. fib with RVR therefore I doubt ACS. We will obtain repeat troponin.  Because of the patient's worsening depression and anxiety, I have contacted TTS for evaluation. The patient has been medically cleared. Her disposition is pending psychiatry team recommendations.  Final Clinical Impressions(s) / ED Diagnoses   Final diagnoses:  None    New Prescriptions New Prescriptions   No medications on file     Laurence Spatesachel Morgan Little, MD 05/21/16 [phone]

## 2016-05-20 NOTE — BH Assessment (Addendum)
Assessment Note  [name] is an 65 y.o. female that presents this date with thoughts of self harm with a plan to overdose on medications. Patient presents with severe anxiety and is tearful on presentation. Patient is accompanied by her son Scot Jun [phone] who transported patient to [facility] after patient had an anxiety attack this date. Patient states she has not eaten in three days due to excessive anxiety and can't remember "the last time she slept." Collateral provided by son reports patient has decompensated mentally the last week stating "she told me she wanted to end her life." Son stated "I have never seen her like this before" and reports confirms that she hasn't eaten "in days." Patient's son assists with patient care and reports he is "afraid she is going to die if she doesn't get help." Patient stated she has been admitted inpatient in July [address]17 at [facility] in [place] for 7 days due to extreme depression and anxiety. Patient denies any prior attempts/gestures of self harm. Patient denies any history of SA use.Patient states she has been receiving services from Renelda Loma MD, that manages her MH medications. Patient reports she last saw that provider two months ago and is current with her medication regimen but cannot recall all her medications. Patient denies any previous inpatient/outpatiernt care. Patient states she has been diagnosed with depression and anxiety. Patient states the anxiety has been "devastating" reporting daily anxiety attacks. Patient states "she can't go on like this anymore." Patient denies any H/I or AVH. Admission notes per record states: "Patient has a history of atrial fibrillation, COPD, AICD. Patient states that she has been having anxiety attacks recently and reports multiple "phobias". Case was staffed with Earlene Plater NP who recommended an inpatient [facility] Psych admission after medically cleared.  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 Allergy   . Anxiety   . Anxiety disorder   . Arthritis of knee    Bilateral  . Atrial fibrillation (HCC)    a) 2D echo 09/11/12: LVEF 55-60%, mild LVH, mld LA dilatation  . Automatic implantable cardiac defibrillator in situ    [employer] ICD  . CAD (coronary artery disease)   . Cardiac arrest (HCC)   . Cerebrovascular disease   . COPD (chronic obstructive pulmonary disease) (HCC)   . Coronary artery disease    non obstructive  . Depression   . Depression   . Fibromuscular dysplasia (HCC)   . HLD (hyperlipidemia)   . HTN (hypertension)   . Long QT syndrome   . Migraine   . Migraine headache   . Seizure disorder (HCC)   . Seizures (HCC)   . Stroke [facility])    no deficits  . Tobacco abuse   . Tobacco abuse   . Ventricular fibrillation [facility])     Past Surgical History:  Procedure Laterality Date  . APPENDECTOMY    . BREAST LUMPECTOMY    . CARDIAC DEFIBRILLATOR PLACEMENT    . CHOLECYSTECTOMY    . COSMETIC SURGERY    . PACEMAKER INSERTION  05/01/2003   ICD, implantation of a Guidant single chamber defibrillator, Doylene Canning. Ladona Ridgel MD  . TUBAL LIGATION      Family History:  Family History  Problem Relation Age of Onset  . Dementia Mother   . Stroke Mother     Multiple  . Aneurysm Sister     Brain  . Stroke Sister   .  Colon cancer Sister   . Cancer Sister   . Uterine cancer Other     Grandmother    Social History:  reports that she has been smoking Cigarettes.  She has a 10.00 pack-year smoking history. She has never used smokeless tobacco. She reports that she does not drink alcohol or use drugs.  Additional Social History:  Alcohol / Drug Use Pain Medications: Denies abuse Prescriptions: Denies abuse Over the Counter: Denies abuse History of alcohol / drug use?: No history of alcohol / drug abuse Longest period of sobriety (when/how long): NA  CIWA:   COWS:    Allergies:   Allergies  Allergen Reactions  . Azithromycin Other (See Comments)    Hypotension   . Ibuprofen Other (See Comments)    Hypotension. Takes ASA without problem   . Codeine Rash  . Demerol [Meperidine] Rash    Home Medications:  (Not in a hospital admission)  OB/GYN Status:  No LMP recorded. Patient is postmenopausal.  General Assessment Data [facility] Assessment: In system Is this a Tele or Face-to-Face Assessment?: Face-to-Face Is this an Initial Assessment or a Re-assessment for this encounter?: Initial Assessment Marital status: Single Maiden name: na Is patient pregnant?: No Pregnancy Status: No Living Arrangements: Alone Can pt return to current living arrangement?: Yes Admission Status: Voluntary Is patient capable of signing voluntary admission?: Yes Referral Source: Self/Family/Friend Insurance type: [employer] Medicare  Medical Screening Exam [facility] Walk-in ONLY) Medical Exam completed: No Reason for MSE not completed: Other: (pt needs medical clearance)  Crisis Care Plan Living Arrangements: Alone Legal Guardian: Other: (na) Name of Psychiatrist: Shugart MD [facility] Name of Therapist: none  Education Status Is patient currently in school?: No Current Grade: na Highest grade of school patient has completed: 1512 Name of school: na Contact person: na  Risk to self with the past 6 months Suicidal Ideation: Yes-Currently Present Has patient been a risk to self within the past 6 months prior to admission? : No Suicidal Intent: No-Not Currently/Within Last 6 Months Has patient had any suicidal intent within the past 6 months prior to admission? : No Is patient at risk for suicide?: Yes Suicidal Plan?: Yes-Currently Present Has patient had any suicidal plan within the past 6 months prior to admission? : No Specify Current Suicidal Plan: Overdosing on medications Access to Means: Yes Specify Access to  Suicidal Means: pt states she has medications What has been your use of drugs/alcohol within the last 12 months?: denies Previous Attempts/Gestures: No How many times?: 0 Other Self Harm Risks: none Triggers for Past Attempts: None known Intentional Self Injurious Behavior: None Family Suicide History: No Recent stressful life event(s): Other (Comment) (declining health) Persecutory voices/beliefs?: No Depression: Yes Depression Symptoms: Insomnia, Tearfulness, Isolating, Loss of interest in usual pleasures Substance abuse history and/or treatment for substance abuse?: No Suicide prevention information given to non-admitted patients: Not applicable  Risk to Others within the past 6 months Homicidal Ideation: No Does patient have any lifetime risk of violence toward others beyond the six months prior to admission? : No Thoughts of Harm to Others: No Current Homicidal Intent: No Current Homicidal Plan: No-Not Currently/Within Last 6 Months Access to Homicidal Means: No Identified Victim: na History of harm to others?: No Assessment of Violence: None Noted Violent Behavior Description: none noted Does patient have access to weapons?: No Criminal Charges Pending?: No Does patient have a court date: No Is patient on probation?: No  Psychosis Hallucinations:  None noted Delusions: None noted  Mental Status Report Appearance/Hygiene: Body odor, Disheveled Eye Contact: Fair Motor Activity: Freedom of movement Speech: Logical/coherent Level of Consciousness: Alert Mood: Depressed Affect: Anxious Anxiety Level: Severe Thought Processes: Coherent, Relevant Judgement: Unimpaired Orientation: Person, Place, Time Obsessive Compulsive Thoughts/Behaviors: None  Cognitive Functioning Concentration: Normal Memory: Recent Intact, Remote Intact IQ: Average Insight: Fair Impulse Control: Fair Appetite: Poor Weight Loss: 10 Weight Gain: 0 Sleep: Decreased Total Hours of Sleep:  3 Vegetative Symptoms: None  ADLScreening [facility] Assessment Services) Patient's cognitive ability adequate to safely complete daily activities?: Yes Patient able to express need for assistance with ADLs?: Yes Independently performs ADLs?: Yes (appropriate for developmental age)  Prior Inpatient Therapy Prior Inpatient Therapy: Yes Prior Therapy Dates: 2017 Prior Therapy Facilty/Provider(s): [facility] Reason for Treatment: MH issues  Prior Outpatient Therapy Prior Outpatient Therapy: Yes Prior Therapy Dates: 2017 Prior Therapy Facilty/Provider(s): [facility] Reason for Treatment: MH issues Does patient have an ACCT team?: No Does patient have Intensive In-House Services?  : No Does patient have [facility] services? : No Does patient have P4CC services?: No  ADL Screening (condition at time of admission) Patient's cognitive ability adequate to safely complete daily activities?: Yes Is the patient deaf or have difficulty hearing?: No Does the patient have difficulty seeing, even when wearing glasses/contacts?: No Does the patient have difficulty concentrating, remembering, or making decisions?: No Patient able to express need for assistance with ADLs?: Yes Does the patient have difficulty dressing or bathing?: No Independently performs ADLs?: Yes (appropriate for developmental age) Does the patient have difficulty walking or climbing stairs?: No Weakness of Legs: None Weakness of Arms/Hands: None     Therapy Consults (therapy consults require a physician order) PT Evaluation Needed: No OT Evalulation Needed: No SLP Evaluation Needed: No Abuse/Neglect Assessment (Assessment to be complete while patient is alone) Physical Abuse: Denies Verbal Abuse: Denies Sexual Abuse: Denies Exploitation of patient/patient's resources: Denies Self-Neglect: Denies Values / Beliefs Cultural Requests During Hospitalization: None Spiritual Requests During Hospitalization: None Consults Spiritual  Care Consult Needed: No Social Work Consult Needed: No Merchant navy officer (For Healthcare) Does patient have an advance directive?: No Would patient like information on creating an advanced directive?: No - patient declined information    Additional Information 1:1 In Past 12 Months?: No CIRT Risk: No Elopement Risk: No Does patient have medical clearance?: No     Disposition: Case was staffed with Earlene Plater NP who recommended an inpatient [facility] admission after medically cleared.  Disposition Initial Assessment Completed for this Encounter: Yes Disposition of Patient: Inpatient treatment program Type of inpatient treatment program: Adult  On Site Evaluation by:   Reviewed with Physician:    Alfredia Ferguson 05/20/2016 6:30 PM

## 2016-05-20 NOTE — ED Triage Notes (Signed)
[facility] pt[facility] of [facility]states she has Afib and [facility]

## 2016-05-20 NOTE — ED Notes (Signed)
Writer ambulated pt to the bathroom, pt sts just had a BM, no urine at this time.

## 2016-05-20 NOTE — BH Assessment (Signed)
[facility] Assessment Progress Note  Case was staffed with Earlene Plateravis NP who recommended an inpatient [facility] admission after medically cleared. [facility] was contacted to inform patient will be transported to be medically cleared as appropriate bed placement is investigated.

## 2016-05-21 ENCOUNTER — Encounter (HOSPITAL_COMMUNITY): Payer: Self-pay | Admitting: Family Medicine

## 2016-05-21 DIAGNOSIS — F419 Anxiety disorder, unspecified: Secondary | ICD-10-CM

## 2016-05-21 DIAGNOSIS — R45851 Suicidal ideations: Secondary | ICD-10-CM | POA: Diagnosis not present

## 2016-05-21 LAB — I-STAT TROPONIN, ED: Troponin i, poc: 0 ng/mL (ref 0.00–0.08)

## 2016-05-21 LAB — URINE MICROSCOPIC-ADD ON

## 2016-05-21 LAB — URINALYSIS, ROUTINE W REFLEX MICROSCOPIC
BILIRUBIN URINE: NEGATIVE
Glucose, UA: NEGATIVE mg/dL
Ketones, ur: NEGATIVE mg/dL
Nitrite: NEGATIVE
Protein, ur: NEGATIVE mg/dL
SPECIFIC GRAVITY, URINE: 1.024 (ref 1.005–1.030)
pH: 5.5 (ref 5.0–8.0)

## 2016-05-21 MED ORDER — METOPROLOL TARTRATE 25 MG PO TABS
25.0000 mg | ORAL_TABLET | Freq: Two times a day (BID) | ORAL | Status: DC
  Administered 2016-05-21 – 2016-05-22 (×3): 25 mg via ORAL
  Filled 2016-05-21 (×3): qty 1

## 2016-05-21 MED ORDER — DIAZEPAM 2 MG PO TABS
2.0000 mg | ORAL_TABLET | Freq: Three times a day (TID) | ORAL | Status: DC | PRN
  Administered 2016-05-21 (×2): 2 mg via ORAL
  Filled 2016-05-21 (×3): qty 1

## 2016-05-21 MED ORDER — TRAZODONE HCL 50 MG PO TABS
50.0000 mg | ORAL_TABLET | Freq: Every evening | ORAL | Status: DC | PRN
  Administered 2016-05-21: 50 mg via ORAL
  Filled 2016-05-21: qty 1

## 2016-05-21 MED ORDER — ACETAMINOPHEN 500 MG PO TABS
1000.0000 mg | ORAL_TABLET | Freq: Four times a day (QID) | ORAL | Status: DC | PRN
  Administered 2016-05-21: 1000 mg via ORAL
  Filled 2016-05-21: qty 2

## 2016-05-21 MED ORDER — HYDROXYZINE HCL 25 MG PO TABS
25.0000 mg | ORAL_TABLET | Freq: Two times a day (BID) | ORAL | Status: DC
  Administered 2016-05-21 – 2016-05-22 (×2): 25 mg via ORAL
  Filled 2016-05-21 (×2): qty 1

## 2016-05-21 MED ORDER — FLUTICASONE PROPIONATE 50 MCG/ACT NA SUSP: 2.0000 | Freq: Every day | NASAL | Status: DC | PRN

## 2016-05-21 MED ORDER — SERTRALINE HCL 50 MG PO TABS
200.0000 mg | ORAL_TABLET | Freq: Every day | ORAL | Status: DC
  Administered 2016-05-21: 200 mg via ORAL
  Filled 2016-05-21: qty 4

## 2016-05-21 MED ORDER — SIMVASTATIN 20 MG PO TABS
20.0000 mg | ORAL_TABLET | Freq: Every day | ORAL | Status: DC
  Administered 2016-05-21: 20 mg via ORAL
  Filled 2016-05-21: qty 1

## 2016-05-21 MED ORDER — RIVAROXABAN 15 MG PO TABS
15.0000 mg | ORAL_TABLET | Freq: Every day | ORAL | Status: DC
  Administered 2016-05-21: 15 mg via ORAL
  Filled 2016-05-21: qty 1

## 2016-05-21 MED ORDER — LORAZEPAM 1 MG PO TABS
1.0000 mg | ORAL_TABLET | Freq: Once | ORAL | Status: AC
  Administered 2016-05-21: 1 mg via ORAL
  Filled 2016-05-21: qty 1

## 2016-05-21 MED ORDER — ACETAMINOPHEN 325 MG PO TABS
650.0000 mg | ORAL_TABLET | ORAL | Status: DC | PRN
  Filled 2016-05-21: qty 2

## 2016-05-21 NOTE — ED Notes (Signed)
Pt stated she was ok today, just feeling anxious

## 2016-05-21 NOTE — Progress Notes (Addendum)
Pt has been accepted to [facility] by Dr. Roselle LocusSaeed and they can take her in the am.  Will make oncoming CSW aware.  RN report number is [id number].  Brittney SavoyJody Melinda Pottinger, LCSW MC[facility]/Evening Coverage [phone]

## 2016-05-21 NOTE — Consult Note (Addendum)
[facility] Psychiatry Consult   Reason for Consult:  Psychiatric Evaluation Referring Physician:  EDP Patient Identification: [name] MRN:  [id number] Principal Diagnosis: Anxiety disorder Diagnosis:   Patient Active Problem List   Diagnosis Date Noted  . Anxiety disorder [F41.9]   . 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 Carotid artery disease ([place]) [I77.9] 04/08/2016  . Benzodiazepine dependence ([place]) [F13.20] 02/11/2014  . Paroxysmal atrial fibrillation ([place]) [I48.0] 11/04/2013  . Atrial fibrillation with RVR ([place]) [I48.91] 09/12/2012  . History of ventricular fibrillation [Z86.79] 09/12/2012  . Anxiety state [F41.1] 05/22/2009  . TOBACCO ABUSE [F17.200] 05/22/2009  . Major depression ([place]) [F32.9] 05/22/2009  . MIGRAINE HEADACHE [G43.909] 05/22/2009  . CAD [I25.10] 05/22/2009  . Long QT syndrome [I45.81] 05/22/2009  . CARDIAC ARREST [I46.9] 05/22/2009  . CEREBROVASCULAR DISEASE [I67.9] 05/22/2009  . COPD [J44.9] 05/22/2009  . DJD (degenerative joint disease) of knee [M17.9] 05/22/2009  . SEIZURE DISORDER [R56.9] 05/22/2009  . AUTOMATIC IMPLANTABLE CARDIAC DEFIBRILLATOR SITU [Z95.810] 05/22/2009    Total Time spent with patient: 45 minutes  Subjective:   [name] is a 65 y.o. female patient who states "my anxiety level is so high I can't function."  HPI:  [name] is a 65 yo Caucasian female who presented to [facility] ED for evaluation of anxiety suicidal ideation. She is seen face-to-face today with Dr. Darleene Cleaver. The patient reports she has had anxiety and panic attacks since age 37. She reports her anxiety impacts her level of function and states she cannot eat or sleep. She reports she was hospitalized at the end of July at [facility]. She states "just as I was beginning to get on track, they discharged me. I tried telling 'em it was too early." She states she is currently prescribed  sertraline 200 mg and reports being compliant with this. She states she was also started on olanzapine during her July hospitalization "but I didn't keep taking it because I forgot my prescriptions at the hospital." She states [facility] Cheron [facility] at [facility] prescribes sertraline but states she has not seen him "since the end of June."  She endorses anxiety and suicidal ideation with a plan to overdose on medications. She denies homicidal ideation, intent or plan. She denies AVH.   Past Psychiatric History: Anxiety, Depression  Risk to Self: Is patient at risk for suicide?: No, but patient needs Medical Clearance Risk to Others:   Prior Inpatient Therapy:   Prior Outpatient Therapy:    Past Medical History:  Past Medical History:  Diagnosis Date  . Allergy   . Anxiety   . Anxiety disorder   . Arthritis of knee    Bilateral  . Atrial fibrillation ([place])    a) 2D echo 09/11/12: LVEF 55-60%, mild LVH, mld LA dilatation  . Automatic implantable cardiac defibrillator in situ    [employer] ICD  . CAD (coronary artery disease)   . Cardiac arrest ([place])   . Cerebrovascular disease   . COPD (chronic obstructive pulmonary disease) ([place])   . Coronary artery disease    non obstructive  . Depression   . Depression   . Fibromuscular dysplasia ([place])   . HLD (hyperlipidemia)   . HTN (hypertension)   . Long QT syndrome   . Migraine   . Migraine headache   . Seizure disorder ([place])   . Seizures ([place])   . Stroke [facility])    no deficits  . Tobacco abuse   .  Tobacco abuse   . Ventricular fibrillation [facility])     Past Surgical History:  Procedure Laterality Date  . APPENDECTOMY    . BREAST LUMPECTOMY    . CARDIAC DEFIBRILLATOR PLACEMENT    . CHOLECYSTECTOMY    . COSMETIC SURGERY    . PACEMAKER INSERTION  05/01/2003   ICD, implantation of a Guidant single chamber defibrillator, Champ Mungo. Lovena Le MD  . TUBAL LIGATION     Family History:  Family History  Problem Relation Age of Onset  .  Dementia Mother   . Stroke Mother     Multiple  . Aneurysm Sister     Brain  . Stroke Sister   . Colon cancer Sister   . Cancer Sister   . Uterine cancer Other     Grandmother   Family Psychiatric  History: unknown Social History:  History  Alcohol Use No     History  Drug Use No    Social History   Social History  . Marital status: Divorced    Spouse name: N/A  . Number of children: N/A  . Years of education: N/A   Occupational History  . Disabled    Social History Main Topics  . Smoking status: Current Some Day Smoker    Packs/day: 0.25    Years: 40.00    Types: Cigarettes  . Smokeless tobacco: Never Used     Comment: almost a pack a week  . Alcohol use No  . Drug use: No  . Sexual activity: No   Other Topics Concern  . None   Social History Narrative   Lives in [place]   On disability for her osteoarthritis   Recently estranged from her husband   Additional Social History:    Allergies:   Allergies  Allergen Reactions  . Azithromycin Other (See Comments)    Reaction:  Hypotension   . Ibuprofen Other (See Comments)    Reaction:  Hypotension    . Codeine Rash  . Demerol [Meperidine] Rash    Labs:  Results for orders placed or performed during the hospital encounter of 05/20/16 (from the past 48 hour(s))  CBC     Status: None   Collection Time: 05/20/16  7:31 PM  Result Value Ref Range   WBC 9.2 4.0 - 10.5 K/uL   RBC 4.53 3.87 - 5.11 MIL/uL   Hemoglobin 14.8 12.0 - 15.0 g/dL   HCT 41.5 36.0 - 46.0 %   MCV 91.6 78.0 - 100.0 fL   MCH 32.7 26.0 - 34.0 pg   MCHC 35.7 30.0 - 36.0 g/dL   RDW 12.4 11.5 - 15.5 %   Platelets 296 150 - 400 K/uL  Comprehensive metabolic panel     Status: Abnormal   Collection Time: 05/20/16  7:31 PM  Result Value Ref Range   Sodium 136 135 - 145 mmol/L   Potassium 3.4 (L) 3.5 - 5.1 mmol/L   Chloride 104 101 - 111 mmol/L   CO2 23 22 - 32 mmol/L   Glucose, Bld 99 65 - 99 mg/dL   BUN 20 6 - 20 mg/dL    Creatinine, Ser 1.81 (H) 0.44 - 1.00 mg/dL   Calcium 10.3 8.9 - 10.3 mg/dL   Total Protein 8.3 (H) 6.5 - 8.1 g/dL   Albumin 4.4 3.5 - 5.0 g/dL   AST 26 15 - 41 U/L   ALT 13 (L) 14 - 54 U/L   Alkaline Phosphatase 87 38 - 126 U/L   Total Bilirubin 0.8  0.3 - 1.2 mg/dL   GFR calc non Af Amer 28 (L) >60 mL/min   GFR calc Af Amer 32 (L) >60 mL/min    Comment: (NOTE) The eGFR has been calculated using the CKD EPI equation. This calculation has not been validated in all clinical situations. eGFR's persistently <60 mL/min signify possible Chronic Kidney Disease.    Anion gap 9 5 - 15  Salicylate level     Status: None   Collection Time: 05/20/16  7:31 PM  Result Value Ref Range   Salicylate Lvl <0.3 2.8 - 30.0 mg/dL  Ethanol     Status: None   Collection Time: 05/20/16  7:31 PM  Result Value Ref Range   Alcohol, Ethyl (B) <5 <5 mg/dL    Comment:        LOWEST DETECTABLE LIMIT FOR SERUM ALCOHOL IS 5 mg/dL FOR MEDICAL PURPOSES ONLY   Acetaminophen level     Status: Abnormal   Collection Time: 05/20/16  7:31 PM  Result Value Ref Range   Acetaminophen (Tylenol), Serum <10 (L) 10 - 30 ug/mL    Comment:        THERAPEUTIC CONCENTRATIONS VARY SIGNIFICANTLY. A RANGE OF 10-30 ug/mL MAY BE AN EFFECTIVE CONCENTRATION FOR MANY PATIENTS. HOWEVER, SOME ARE BEST TREATED AT CONCENTRATIONS OUTSIDE THIS RANGE. ACETAMINOPHEN CONCENTRATIONS >150 ug/mL AT 4 HOURS AFTER INGESTION AND >50 ug/mL AT 12 HOURS AFTER INGESTION ARE OFTEN ASSOCIATED WITH TOXIC REACTIONS.   I-stat troponin, ED     Status: None   Collection Time: 05/20/16  7:38 PM  Result Value Ref Range   Troponin i, poc 0.01 0.00 - 0.08 ng/mL   Comment 3            Comment: Due to the release kinetics of cTnI, a negative result within the first hours of the onset of symptoms does not rule out myocardial infarction with certainty. If myocardial infarction is still suspected, repeat the test at appropriate intervals.   Urine  rapid drug screen (hosp performed)     Status: Abnormal   Collection Time: 05/20/16 10:19 PM  Result Value Ref Range   Opiates NONE DETECTED NONE DETECTED   Cocaine NONE DETECTED NONE DETECTED   Benzodiazepines POSITIVE (A) NONE DETECTED   Amphetamines NONE DETECTED NONE DETECTED   Tetrahydrocannabinol NONE DETECTED NONE DETECTED   Barbiturates NONE DETECTED NONE DETECTED    Comment:        DRUG SCREEN FOR MEDICAL PURPOSES ONLY.  IF CONFIRMATION IS NEEDED FOR ANY PURPOSE, NOTIFY LAB WITHIN 5 DAYS.        LOWEST DETECTABLE LIMITS FOR URINE DRUG SCREEN Drug Class       Cutoff (ng/mL) Amphetamine      1000 Barbiturate      200 Benzodiazepine   009 Tricyclics       233 Opiates          300 Cocaine          300 THC              50   I-stat troponin, ED     Status: None   Collection Time: 05/21/16  2:13 AM  Result Value Ref Range   Troponin i, poc 0.00 0.00 - 0.08 ng/mL   Comment 3            Comment: Due to the release kinetics of cTnI, a negative result within the first hours of the onset of symptoms does not rule out myocardial infarction with certainty. If  myocardial infarction is still suspected, repeat the test at appropriate intervals.   Urinalysis, Routine w reflex microscopic (not at [facility])     Status: Abnormal   Collection Time: 05/21/16  6:42 PM  Result Value Ref Range   Color, Urine YELLOW YELLOW   APPearance CLEAR CLEAR   Specific Gravity, Urine 1.024 1.005 - 1.030   pH 5.5 5.0 - 8.0   Glucose, UA NEGATIVE NEGATIVE mg/dL   Hgb urine dipstick TRACE (A) NEGATIVE   Bilirubin Urine NEGATIVE NEGATIVE   Ketones, ur NEGATIVE NEGATIVE mg/dL   Protein, ur NEGATIVE NEGATIVE mg/dL   Nitrite NEGATIVE NEGATIVE   Leukocytes, UA MODERATE (A) NEGATIVE  Urine microscopic-add on     Status: Abnormal   Collection Time: 05/21/16  6:42 PM  Result Value Ref Range   Squamous Epithelial / LPF 0-5 (A) NONE SEEN   WBC, UA TOO NUMEROUS TO COUNT 0 - 5 WBC/hpf   RBC / HPF 0-5 0 - 5  RBC/hpf   Bacteria, UA MANY (A) NONE SEEN    Current Facility-Administered Medications  Medication Dose Route Frequency Provider Last Rate Last Dose  . acetaminophen (TYLENOL) tablet 1,000 mg  1,000 mg Oral Q6H PRN Jola Schmidt, MD   1,000 mg at 05/21/16 1449  . acetaminophen (TYLENOL) tablet 650 mg  650 mg Oral Q4H PRN April Palumbo, MD      . diazepam (VALIUM) tablet 2 mg  2 mg Oral TID PRN Jola Schmidt, MD   2 mg at 05/21/16 1449  . fluticasone (FLONASE) 50 MCG/ACT nasal spray 2 spray  2 spray Each Nare Daily PRN Jola Schmidt, MD      . hydrOXYzine (ATARAX/VISTARIL) tablet 25 mg  25 mg Oral BID PC  , MD   25 mg at 05/21/16 1811  . metoprolol tartrate (LOPRESSOR) tablet 25 mg  25 mg Oral BID Jola Schmidt, MD   25 mg at 05/21/16 5498  . Rivaroxaban (XARELTO) tablet 15 mg  15 mg Oral QHS Jola Schmidt, MD      . sertraline (ZOLOFT) tablet 200 mg  200 mg Oral QHS Jola Schmidt, MD      . simvastatin (ZOCOR) tablet 20 mg  20 mg Oral QHS Jola Schmidt, MD      . traZODone (DESYREL) tablet 50 mg  50 mg Oral QHS PRN Jola Schmidt, MD       Current Outpatient Prescriptions  Medication Sig Dispense Refill  . acetaminophen (TYLENOL) 500 MG tablet Take 1,000 mg by mouth every 6 (six) hours as needed for mild pain, moderate pain or headache.     . cholecalciferol (VITAMIN D) 1000 units tablet Take 1,000 Units by mouth daily.    . diazepam (VALIUM) 2 MG tablet Take 1 tablet (2 mg total) by mouth 3 (three) times daily as needed for anxiety. 30 tablet 0  . diphenhydrAMINE (BENADRYL) 25 MG tablet Take 50 mg by mouth every 6 (six) hours as needed for allergies.     . fluticasone (FLONASE) 50 MCG/ACT nasal spray Place 2 sprays into both nostrils daily as needed for rhinitis.     . metoprolol tartrate (LOPRESSOR) 25 MG tablet Take 25 mg by mouth 2 (two) times daily. Pt is able to take an additional dose if needed for A-fib.    . Rivaroxaban (XARELTO) 15 MG TABS tablet Take 15 mg by mouth at  bedtime.    . sertraline (ZOLOFT) 100 MG tablet Take 200 mg by mouth at bedtime.    . simvastatin (  ZOCOR) 20 MG tablet Take 1 tablet (20 mg total) by mouth at bedtime. 90 tablet 3  . traZODone (DESYREL) 50 MG tablet Take 50 mg by mouth at bedtime as needed for sleep.     Marland Kitchen amoxicillin (AMOXIL) 500 MG capsule Take 1 capsule (500 mg total) by mouth 3 (three) times daily. (Patient not taking: Reported on 05/20/2016) 30 capsule 0    Musculoskeletal: Strength & Muscle Tone: within normal limits Gait & Station: normal Patient leans: N/A  Psychiatric Specialty Exam: Physical Exam  Constitutional: She appears well-developed and well-nourished.  HENT:  Head: Normocephalic.  Neck: Normal range of motion.  Respiratory: Effort normal.  Neurological: She is alert.  Skin: Skin is warm and dry.  Psychiatric:  See psychiatric specialty exam    Review of Systems  Constitutional: Positive for malaise/fatigue.  HENT: Negative.   Eyes: Negative.   Respiratory: Negative.   Cardiovascular: Negative.   Genitourinary: Negative.   Musculoskeletal: Negative.   Skin: Negative.   Neurological: Negative.   Endo/Heme/Allergies: Negative.   Psychiatric/Behavioral: Positive for depression and suicidal ideas. The patient is nervous/anxious.     Blood pressure 133/75, pulse 70, temperature 97.5 F (36.4 C), temperature source Oral, resp. rate 16, SpO2 97 %.There is no height or weight on file to calculate BMI.  General Appearance: Fairly Groomed  Eye Contact:  Fair  Speech:  Clear and Coherent and Normal Rate  Volume:  Normal  Mood:  Anxious  Affect:  Congruent  Thought Process:  Coherent  Orientation:  Full (Time, Place, and Person)  Thought Content:  Rumination  Suicidal Thoughts:  Yes.  with intent/plan  Homicidal Thoughts:  No  Memory:  Immediate;   Good Recent;   Good Remote;   Fair  Judgement:  Fair  Insight:  Lacking  Psychomotor Activity:  Normal  Concentration:  Concentration: Fair and  Attention Span: Fair  Recall:  Good  Fund of Knowledge:  Fair  Language:  Fair  Akathisia:  No  Handed:  Right  AIMS (if indicated):     Assets:  Communication Skills Desire for Improvement Resilience  ADL's:  Intact  Cognition:  WNL  Sleep:      -Case discussed with Dr. Darleene Cleaver; recommendations below.   Treatment Plan Summary: Daily contact with patient to assess and evaluate symptoms and progress in treatment and Medication management  -Continue home medications  Disposition: Recommend psychiatric Inpatient admission when medically cleared. Seek placement at geropsych facility.   Lurena Nida, NP 05/21/2016 9:02 PM  Patient seen face-to-face for psychiatric evaluation, chart reviewed and case discussed with the physician extender and developed treatment plan. Reviewed the information documented and agree with the treatment plan. Corena Pilgrim, MD

## 2016-05-21 NOTE — Progress Notes (Signed)
CSW received call from [facility] stating they are able to give bed offer pending repeat EKG.   Stacy GardnerErin Davianna Deutschman, LCSWA Clinical Social Worker [phone]

## 2016-05-21 NOTE — ED Notes (Signed)
She has just eaten breakfast; and has ambulated to b.r. And back without difficulty.  Dr. Patria Maneampos orders her usual home meds at this time.  Pt. Remains in no distress.

## 2016-05-21 NOTE — BH Assessment (Signed)
[facility] Assessment Progress Note  Per Mojeed Akintayo, MD, this pt requires psychiatric hospitalization at this time.  The following facilities have been contacted to seek placement for this pt, with results as noted:  Beds available, information sent, decision pending:  [facility]   At capacity:  [facility]   Ellinor Test, MA Triage Specialist [phone]     

## 2016-05-21 NOTE — ED Notes (Signed)
Pt reports that she wants help with anxiety, panic attacks and phobias. She says that she was at [facility] a month ago for 5 days and was upset when she left the hospital due to her belongings being lost. She says that she did not have any follow up treatment when she left. Pt reports moving from [place] to [place] the 1st of May. While in [place] [facility] and was given zoloft. She had a three month supply when she moved and says that she wants a different medication and does not have a doctor in [place] at this time. Pt c/o a headache and anxiety. She was given prn medication. Safety maintained on the unit.

## 2016-05-21 NOTE — BH Assessment (Signed)
Writer referred patient to the follow psychiatric hospitals   [facility], [facility], [facility], [facility], [facility], [facility], [facility], [facility], [facility]

## 2016-05-22 DIAGNOSIS — I25119 Atherosclerotic heart disease of native coronary artery with unspecified angina pectoris: Secondary | ICD-10-CM | POA: Diagnosis not present

## 2016-05-22 DIAGNOSIS — I4581 Long QT syndrome: Secondary | ICD-10-CM | POA: Diagnosis not present

## 2016-05-22 DIAGNOSIS — Z5309 Procedure and treatment not carried out because of other contraindication: Secondary | ICD-10-CM | POA: Diagnosis not present

## 2016-05-22 DIAGNOSIS — I4892 Unspecified atrial flutter: Secondary | ICD-10-CM | POA: Diagnosis not present

## 2016-05-22 DIAGNOSIS — J449 Chronic obstructive pulmonary disease, unspecified: Secondary | ICD-10-CM | POA: Diagnosis not present

## 2016-05-22 DIAGNOSIS — I4891 Unspecified atrial fibrillation: Secondary | ICD-10-CM | POA: Diagnosis not present

## 2016-05-22 DIAGNOSIS — Z885 Allergy status to narcotic agent status: Secondary | ICD-10-CM | POA: Diagnosis not present

## 2016-05-22 DIAGNOSIS — R002 Palpitations: Secondary | ICD-10-CM | POA: Diagnosis not present

## 2016-05-22 DIAGNOSIS — I4430 Unspecified atrioventricular block: Secondary | ICD-10-CM | POA: Diagnosis not present

## 2016-05-22 DIAGNOSIS — R140 Abdominal distension (gaseous): Secondary | ICD-10-CM | POA: Diagnosis not present

## 2016-05-22 DIAGNOSIS — Z881 Allergy status to other antibiotic agents status: Secondary | ICD-10-CM | POA: Diagnosis not present

## 2016-05-22 DIAGNOSIS — Z95810 Presence of automatic (implantable) cardiac defibrillator: Secondary | ICD-10-CM | POA: Diagnosis not present

## 2016-05-22 DIAGNOSIS — Z8674 Personal history of sudden cardiac arrest: Secondary | ICD-10-CM | POA: Diagnosis not present

## 2016-05-22 DIAGNOSIS — Z886 Allergy status to analgesic agent status: Secondary | ICD-10-CM | POA: Diagnosis not present

## 2016-05-22 DIAGNOSIS — I4901 Ventricular fibrillation: Secondary | ICD-10-CM | POA: Diagnosis not present

## 2016-05-22 DIAGNOSIS — I480 Paroxysmal atrial fibrillation: Secondary | ICD-10-CM | POA: Diagnosis not present

## 2016-05-22 DIAGNOSIS — I10 Essential (primary) hypertension: Secondary | ICD-10-CM | POA: Diagnosis not present

## 2016-05-22 DIAGNOSIS — R079 Chest pain, unspecified: Secondary | ICD-10-CM | POA: Diagnosis not present

## 2016-05-22 DIAGNOSIS — E782 Mixed hyperlipidemia: Secondary | ICD-10-CM | POA: Diagnosis not present

## 2016-05-22 DIAGNOSIS — Z9889 Other specified postprocedural states: Secondary | ICD-10-CM | POA: Diagnosis not present

## 2016-05-22 DIAGNOSIS — R569 Unspecified convulsions: Secondary | ICD-10-CM | POA: Diagnosis not present

## 2016-05-22 DIAGNOSIS — R0789 Other chest pain: Secondary | ICD-10-CM | POA: Diagnosis not present

## 2016-05-22 DIAGNOSIS — Z87891 Personal history of nicotine dependence: Secondary | ICD-10-CM | POA: Diagnosis not present

## 2016-05-22 DIAGNOSIS — Z9049 Acquired absence of other specified parts of digestive tract: Secondary | ICD-10-CM | POA: Diagnosis not present

## 2016-05-22 NOTE — ED Notes (Signed)
Pt d/c from the hospital with [facility] to [facility]. All items returned. EMTALA completed.

## 2016-05-22 NOTE — BH Assessment (Signed)
Received call from [facility] , informed [facility] that patient has been accepted at another facility.   Davina PokeJoVea Inigo Lantigua, LCSW Therapeutic Triage Specialist [facility] 05/22/2016 6:26 AM

## 2016-05-22 NOTE — BH Assessment (Addendum)
[facility] Assessment Progress Note  This Clinical research associatewriter spoke to pt about decision to transfer her to [facility].  She agrees to this plan.  She is currently under voluntary status, and is to be transported via [facility].  Nanine MeansJamison Lord, DNP, and pt's nurse, [name], have both been notified.  Please see note from Pollyann SavoyJody Drake, [place], dated 05/21/2016 for further details.  Doylene Canninghomas Camry Theiss, MA Triage Specialist [phone]

## 2016-05-23 DIAGNOSIS — I517 Cardiomegaly: Secondary | ICD-10-CM | POA: Diagnosis not present

## 2016-05-23 DIAGNOSIS — I369 Nonrheumatic tricuspid valve disorder, unspecified: Secondary | ICD-10-CM | POA: Diagnosis not present

## 2016-05-23 DIAGNOSIS — I25119 Atherosclerotic heart disease of native coronary artery with unspecified angina pectoris: Secondary | ICD-10-CM | POA: Diagnosis not present

## 2016-05-23 DIAGNOSIS — I480 Paroxysmal atrial fibrillation: Secondary | ICD-10-CM | POA: Diagnosis not present

## 2016-05-23 DIAGNOSIS — I348 Other nonrheumatic mitral valve disorders: Secondary | ICD-10-CM | POA: Diagnosis not present

## 2016-05-24 DIAGNOSIS — R110 Nausea: Secondary | ICD-10-CM | POA: Diagnosis not present

## 2016-05-24 DIAGNOSIS — I4891 Unspecified atrial fibrillation: Secondary | ICD-10-CM | POA: Diagnosis not present

## 2016-05-24 DIAGNOSIS — I10 Essential (primary) hypertension: Secondary | ICD-10-CM | POA: Diagnosis not present

## 2016-05-24 DIAGNOSIS — I2510 Atherosclerotic heart disease of native coronary artery without angina pectoris: Secondary | ICD-10-CM | POA: Diagnosis not present

## 2016-05-24 DIAGNOSIS — I959 Hypotension, unspecified: Secondary | ICD-10-CM | POA: Diagnosis not present

## 2016-05-27 DIAGNOSIS — E860 Dehydration: Secondary | ICD-10-CM | POA: Diagnosis not present

## 2016-05-27 DIAGNOSIS — R0600 Dyspnea, unspecified: Secondary | ICD-10-CM | POA: Diagnosis not present

## 2016-05-27 DIAGNOSIS — Z87891 Personal history of nicotine dependence: Secondary | ICD-10-CM | POA: Diagnosis not present

## 2016-05-27 DIAGNOSIS — I959 Hypotension, unspecified: Secondary | ICD-10-CM | POA: Diagnosis not present

## 2016-05-27 DIAGNOSIS — I480 Paroxysmal atrial fibrillation: Secondary | ICD-10-CM | POA: Diagnosis not present

## 2016-05-27 DIAGNOSIS — I4891 Unspecified atrial fibrillation: Secondary | ICD-10-CM | POA: Diagnosis not present

## 2016-05-27 DIAGNOSIS — I10 Essential (primary) hypertension: Secondary | ICD-10-CM | POA: Diagnosis not present

## 2016-05-27 DIAGNOSIS — R079 Chest pain, unspecified: Secondary | ICD-10-CM | POA: Diagnosis not present

## 2016-05-27 DIAGNOSIS — R110 Nausea: Secondary | ICD-10-CM | POA: Diagnosis not present

## 2016-05-27 DIAGNOSIS — I2510 Atherosclerotic heart disease of native coronary artery without angina pectoris: Secondary | ICD-10-CM | POA: Diagnosis not present

## 2016-05-27 DIAGNOSIS — Z95810 Presence of automatic (implantable) cardiac defibrillator: Secondary | ICD-10-CM | POA: Diagnosis not present

## 2016-05-29 DIAGNOSIS — R110 Nausea: Secondary | ICD-10-CM | POA: Diagnosis not present

## 2016-05-29 DIAGNOSIS — I480 Paroxysmal atrial fibrillation: Secondary | ICD-10-CM | POA: Diagnosis not present

## 2016-05-29 DIAGNOSIS — I499 Cardiac arrhythmia, unspecified: Secondary | ICD-10-CM | POA: Diagnosis not present

## 2016-05-29 DIAGNOSIS — I4891 Unspecified atrial fibrillation: Secondary | ICD-10-CM | POA: Diagnosis not present

## 2016-05-29 DIAGNOSIS — I10 Essential (primary) hypertension: Secondary | ICD-10-CM | POA: Diagnosis not present

## 2016-05-29 DIAGNOSIS — Z87891 Personal history of nicotine dependence: Secondary | ICD-10-CM | POA: Diagnosis not present

## 2016-05-29 DIAGNOSIS — R0789 Other chest pain: Secondary | ICD-10-CM | POA: Diagnosis not present

## 2016-05-29 DIAGNOSIS — Z95810 Presence of automatic (implantable) cardiac defibrillator: Secondary | ICD-10-CM | POA: Diagnosis not present

## 2016-05-29 DIAGNOSIS — I959 Hypotension, unspecified: Secondary | ICD-10-CM | POA: Diagnosis not present

## 2016-05-29 DIAGNOSIS — R079 Chest pain, unspecified: Secondary | ICD-10-CM | POA: Diagnosis not present

## 2016-05-30 DIAGNOSIS — R0789 Other chest pain: Secondary | ICD-10-CM | POA: Diagnosis not present

## 2016-05-30 DIAGNOSIS — Z95810 Presence of automatic (implantable) cardiac defibrillator: Secondary | ICD-10-CM | POA: Diagnosis not present

## 2016-05-30 DIAGNOSIS — I10 Essential (primary) hypertension: Secondary | ICD-10-CM | POA: Diagnosis not present

## 2016-05-30 DIAGNOSIS — I480 Paroxysmal atrial fibrillation: Secondary | ICD-10-CM | POA: Diagnosis not present

## 2016-05-31 DIAGNOSIS — I4891 Unspecified atrial fibrillation: Secondary | ICD-10-CM | POA: Diagnosis not present

## 2016-06-09 ENCOUNTER — Emergency Department (HOSPITAL_COMMUNITY)
Admission: EM | Admit: 2016-06-09 | Discharge: 2016-06-10 | Disposition: A | Discharge status: Home / Self Care | Payer: Medicare Other | Attending: Emergency Medicine | Admitting: Emergency Medicine

## 2016-06-09 ENCOUNTER — Encounter (HOSPITAL_COMMUNITY): Payer: Self-pay | Admitting: Emergency Medicine

## 2016-06-09 ENCOUNTER — Emergency Department (HOSPITAL_COMMUNITY): Payer: Medicare Other

## 2016-06-09 DIAGNOSIS — Z7901 Long term (current) use of anticoagulants: Secondary | ICD-10-CM | POA: Diagnosis not present

## 2016-06-09 DIAGNOSIS — J449 Chronic obstructive pulmonary disease, unspecified: Secondary | ICD-10-CM | POA: Diagnosis not present

## 2016-06-09 DIAGNOSIS — I2510 Atherosclerotic heart disease of native coronary artery without angina pectoris: Secondary | ICD-10-CM | POA: Diagnosis not present

## 2016-06-09 DIAGNOSIS — I480 Paroxysmal atrial fibrillation: Secondary | ICD-10-CM | POA: Insufficient documentation

## 2016-06-09 DIAGNOSIS — F17210 Nicotine dependence, cigarettes, uncomplicated: Secondary | ICD-10-CM | POA: Diagnosis not present

## 2016-06-09 DIAGNOSIS — R0602 Shortness of breath: Secondary | ICD-10-CM | POA: Diagnosis not present

## 2016-06-09 DIAGNOSIS — R0789 Other chest pain: Secondary | ICD-10-CM | POA: Diagnosis not present

## 2016-06-09 DIAGNOSIS — Z8673 Personal history of transient ischemic attack (TIA), and cerebral infarction without residual deficits: Secondary | ICD-10-CM | POA: Diagnosis not present

## 2016-06-09 DIAGNOSIS — R079 Chest pain, unspecified: Secondary | ICD-10-CM | POA: Insufficient documentation

## 2016-06-09 LAB — BASIC METABOLIC PANEL
ANION GAP: 6 (ref 5–15)
BUN: 24 mg/dL — ABNORMAL HIGH (ref 6–20)
CHLORIDE: 104 mmol/L (ref 101–111)
CO2: 28 mmol/L (ref 22–32)
Calcium: 9.4 mg/dL (ref 8.9–10.3)
Creatinine, Ser: 1.75 mg/dL — ABNORMAL HIGH (ref 0.44–1.00)
GFR calc non Af Amer: 29 mL/min — ABNORMAL LOW (ref >60)
GFR, EST AFRICAN AMERICAN: 34 mL/min — AB (ref >60)
Glucose, Bld: 97 mg/dL (ref 65–99)
POTASSIUM: 3.5 mmol/L (ref 3.5–5.1)
SODIUM: 138 mmol/L (ref 135–145)

## 2016-06-09 LAB — CBC
HEMATOCRIT: 37.8 % (ref 36.0–46.0)
HEMOGLOBIN: 12.3 g/dL (ref 12.0–15.0)
MCH: 31.8 pg (ref 26.0–34.0)
MCHC: 32.5 g/dL (ref 30.0–36.0)
MCV: 97.7 fL (ref 78.0–100.0)
PLATELETS: 266 10*3/uL (ref 150–400)
RBC: 3.87 MIL/uL (ref 3.87–5.11)
RDW: 12.5 % (ref 11.5–15.5)
WBC: 7.2 10*3/uL (ref 4.0–10.5)

## 2016-06-09 LAB — I-STAT TROPONIN, ED: Troponin i, poc: 0.01 ng/mL (ref 0.00–0.08)

## 2016-06-09 LAB — MAGNESIUM: MAGNESIUM: 2.1 mg/dL (ref 1.7–2.4)

## 2016-06-09 NOTE — ED Triage Notes (Signed)
In EMS from home, reports CP that started 6pm. Hx afib, anxiety. Given 0.4 NTG spray, took pain 6/10 to 4/10. VSS, A/OX4.

## 2016-06-09 NOTE — ED Provider Notes (Signed)
By signing my name below, I, Suzan Slick. Elon Spanner, attest that this documentation has been prepared under the direction and in the presence of Hanaa Payes N Casmira Cramer, DO.  Electronically Signed: Suzan Slick. Elon Spanner, ED Scribe. 06/09/16. 11:56 PM.   TIME SEEN: 11:21 PM   CHIEF COMPLAINT:  Chief Complaint  Patient presents with  . Chest Pain     HPI: HPI Comments: [name], brought in by EMS is a 65 y.o. female with a PMHx of atrial fibrillation, anxiety, COPD, CAD, stroke, and HLD who presents to the Emergency Department her for recurrent, intermittent fluttering in her chest this evening. She states last episode began at approximately 6:30 PM this evening. She reports associated shortness of breath, chest pain, and nausea which she states is typically baseline when she has episodes of A-Fib. Currently she is symptom free. Pt states she was recently evaluated at [facility] on 05/29/16 for same and was started on AmiodaroneAnd taken off of metoprolol. She was then recommended for a cardioversion after experiencing another episode of A-Fib. However, she did not go through wih procedure as she went back into rhythm at time of cardioversion. Pt states she was told she would need an ablation if A-Fib did not respond Amiodarone. However, she has not contacted her cardiologist, Dr. Kirke Corin for these concerns. No recent fever, cough, vomiting or diarrhea. States she is here because she wanted to be scheduled for an ablation.  She states she had a negative stress test at [facility] one month ago.  PCP: Clementeen Graham, MD   CARDIOLOGIST: Rachel Moulds, MD  ROS: See HPI Constitutional: no fever. Positive fatigue Eyes: no drainage  ENT: no runny nose   Cardiovascular:  Positive chest pain  Resp: Positive SOB  GI: no vomiting GU: no dysuria Integumentary: no rash  Allergy: no hives  Musculoskeletal: no leg swelling  Neurological: no slurred speech ROS otherwise negative  PAST MEDICAL HISTORY/PAST SURGICAL HISTORY:   Past Medical History:  Diagnosis Date  . Allergy   . Anxiety   . Anxiety disorder   . Arthritis of knee    Bilateral  . Atrial fibrillation (HCC)    a) 2D echo 09/11/12: LVEF 55-60%, mild LVH, mld LA dilatation  . Automatic implantable cardiac defibrillator in situ    [employer] ICD  . CAD (coronary artery disease)   . Cardiac arrest (HCC)   . Cerebrovascular disease   . COPD (chronic obstructive pulmonary disease) (HCC)   . Coronary artery disease    non obstructive  . Depression   . Depression   . Fibromuscular dysplasia (HCC)   . HLD (hyperlipidemia)   . HTN (hypertension)   . Long QT syndrome   . Migraine   . Migraine headache   . Seizure disorder (HCC)   . Seizures (HCC)   . Stroke [facility])    no deficits  . Tobacco abuse   . Tobacco abuse   . Ventricular fibrillation (HCC)     MEDICATIONS:  Prior to Admission medications   Medication Sig Start Date End Date Taking? Authorizing Provider  acetaminophen (TYLENOL) 500 MG tablet Take 1,000 mg by mouth every 6 (six) hours as needed for mild pain, moderate pain or headache.     Historical Provider, MD  amoxicillin (AMOXIL) 500 MG capsule Take 1 capsule (500 mg total) by mouth 3 (three) times daily. Patient not taking: Reported on 05/20/2016 04/08/16   Rodolph Bong, MD  cholecalciferol (VITAMIN D) 1000 units tablet Take 1,000 Units by  mouth daily.    Historical Provider, MD  diazepam (VALIUM) 2 MG tablet Take 1 tablet (2 mg total) by mouth 3 (three) times daily as needed for anxiety. 05/06/16 08/04/16  Thresa RossNadeem Akhtar, MD  diphenhydrAMINE (BENADRYL) 25 MG tablet Take 50 mg by mouth every 6 (six) hours as needed for allergies.     Historical Provider, MD  fluticasone (FLONASE) 50 MCG/ACT nasal spray Place 2 sprays into both nostrils daily as needed for rhinitis.     Historical Provider, MD  metoprolol tartrate (LOPRESSOR) 25 MG tablet Take 25 mg by mouth 2 (two) times daily. Pt is able to take an additional dose if needed  for A-fib.    Historical Provider, MD  Rivaroxaban (XARELTO) 15 MG TABS tablet Take 15 mg by mouth at bedtime.    Historical Provider, MD  sertraline (ZOLOFT) 100 MG tablet Take 200 mg by mouth at bedtime.    Historical Provider, MD  simvastatin (ZOCOR) 20 MG tablet Take 1 tablet (20 mg total) by mouth at bedtime. 06/06/15   Iran OuchMuhammad A Arida, MD  traZODone (DESYREL) 50 MG tablet Take 50 mg by mouth at bedtime as needed for sleep.     Historical Provider, MD    ALLERGIES:  Allergies  Allergen Reactions  . Azithromycin Other (See Comments)    Reaction:  Hypotension   . Ibuprofen Other (See Comments)    Reaction:  Hypotension    . Codeine Rash  . Demerol [Meperidine] Rash    SOCIAL HISTORY:  Social History  Substance Use Topics  . Smoking status: Current Some Day Smoker    Packs/day: 0.25    Years: 40.00    Types: Cigarettes  . Smokeless tobacco: Never Used     Comment: almost a pack a week  . Alcohol use No    FAMILY HISTORY: Family History  Problem Relation Age of Onset  . Dementia Mother   . Stroke Mother     Multiple  . Aneurysm Sister     Brain  . Stroke Sister   . Colon cancer Sister   . Cancer Sister   . Uterine cancer Other     Grandmother    EXAM: BP 120/79 (BP Location: Left Arm)   Pulse 99   Temp 97.7 F (36.5 C) (Oral)   Resp 24   Ht 5\' 4"  (1.626 m)   Wt 167 lb (75.8 kg)   SpO2 98%   BMI 28.67 kg/m  CONSTITUTIONAL: Alert and oriented and responds appropriately to questions. Chronically ill appearing and elderly. HEAD: Normocephalic EYES: Conjunctivae clear, PERRL ENT: normal nose; no rhinorrhea; moist mucous membranes NECK: Supple, no meningismus, no LAD  CARD: RRR; S1 and S2 appreciated; no murmurs, no clicks, no rubs, no gallops RESP: Normal chest excursion without splinting or tachypnea; breath sounds clear and equal bilaterally; no wheezes, no rhonchi, no rales, no hypoxia or respiratory distress, speaking full sentences ABD/GI: Normal bowel  sounds; non-distended; soft, non-tender, no rebound, no guarding, no peritoneal signs BACK:  The back appears normal and is non-tender to palpation, there is no CVA tenderness EXT: Normal ROM in all joints; non-tender to palpation; no edema; normal capillary refill; no cyanosis, no calf tenderness or swelling    SKIN: Normal color for age and race; warm; no rash NEURO: Moves all extremities equally, sensation to light touch intact diffusely, cranial nerves II through XII intact PSYCH: The patient's mood and manner are appropriate. Grooming and personal hygiene are appropriate.  MEDICAL DECISION MAKING: Patient here  with chest pain, shortness of breath and nausea and palpitations are similar to her previous episodes of atrial fibrillation. She is now back in a sinus rhythm and states her symptoms are completely gone. When she was in atrial fibrillation her rate was controlled. She is requesting an ablation today. Discussed with her that this would be something she can discuss with her cardiologist as an outpatient. Her labs are unremarkable except for mildly elevated creatinine which is her baseline. Electrolytes within normal limits. First troponin negative. Plan is to monitor patient, repeat troponin and if vitals a normal, patient remains pain-free and delta troponin negative, will discharge home with close outpatient cardial to follow-up. She is comfortable with this plan.  ED PROGRESS: 3:15 AM  Pt's 2nd pain is negative. She still hemodynamically stable and asymptomatic. Still in a normal sinus rhythm. Have advised her to call her cardiologist in the morning to schedule an outpatient appointment to discuss options for possible ablation for her paroxysmal atrial fibrillation. Advised her to continue her amiodarone and Xarelto as prescribed. Discussed return precautions. She verbalized understanding and is comfortable with this plan.   At this time, I do not feel there is any life-threatening condition  present. I have reviewed and discussed all results (EKG, imaging, lab, urine as appropriate), exam findings with patient/family. I have reviewed nursing notes and appropriate previous records.  I feel the patient is safe to be discharged home without further emergent workup and can continue workup as an outpatient as needed. Discussed usual and customary return precautions. Patient/family verbalize understanding and are comfortable with this plan.  Outpatient follow-up has been provided. All questions have been answered.     EKG Interpretation  Date/Time:  Monday June 09 2016 22:10:40 EDT Ventricular Rate:  86 PR Interval:    QRS Duration: 88 QT Interval:  434 QTC Calculation: 519 R Axis:   21 Text Interpretation:  Atrial flutter with variable A-V block that is new compared to prior Low voltage QRS Cannot rule out Anterior infarct , age undetermined Prolonged QT Abnormal ECG Confirmed by Janus Vlcek,  DO, Choice Kleinsasser [phone]) on 06/09/2016 11:11:53 PM        EKG Interpretation  Date/Time:  Monday June 09 2016 23:30:48 EDT Ventricular Rate:  72 PR Interval:    QRS Duration: 121 QT Interval:  408 QTC Calculation: 447 R Axis:   40 Text Interpretation:  Age not entered, assumed to be  65 years old for purpose of ECG interpretation Sinus rhythm Prolonged PR interval Nonspecific intraventricular conduction delay Borderline T abnormalities, anterior leads A flutter has resolved No significant change since May 21 2016 Confirmed by Elesa Massed,  DO, Omya Winfield [phone]) on 06/10/2016 12:38:50 AM         I personally performed the services described in this documentation, which was scribed in my presence. The recorded information has been reviewed and is accurate.    Layla Maw Ganon Demasi, DO 06/10/16 [phone]

## 2016-06-10 ENCOUNTER — Telehealth: Payer: Self-pay

## 2016-06-10 DIAGNOSIS — Z885 Allergy status to narcotic agent status: Secondary | ICD-10-CM | POA: Diagnosis not present

## 2016-06-10 DIAGNOSIS — Z7951 Long term (current) use of inhaled steroids: Secondary | ICD-10-CM | POA: Diagnosis not present

## 2016-06-10 DIAGNOSIS — I252 Old myocardial infarction: Secondary | ICD-10-CM | POA: Diagnosis not present

## 2016-06-10 DIAGNOSIS — Z7901 Long term (current) use of anticoagulants: Secondary | ICD-10-CM | POA: Diagnosis not present

## 2016-06-10 DIAGNOSIS — Z881 Allergy status to other antibiotic agents status: Secondary | ICD-10-CM | POA: Diagnosis not present

## 2016-06-10 DIAGNOSIS — I480 Paroxysmal atrial fibrillation: Secondary | ICD-10-CM | POA: Diagnosis not present

## 2016-06-10 DIAGNOSIS — Z95810 Presence of automatic (implantable) cardiac defibrillator: Secondary | ICD-10-CM | POA: Diagnosis not present

## 2016-06-10 DIAGNOSIS — Z8673 Personal history of transient ischemic attack (TIA), and cerebral infarction without residual deficits: Secondary | ICD-10-CM | POA: Diagnosis not present

## 2016-06-10 DIAGNOSIS — Z79899 Other long term (current) drug therapy: Secondary | ICD-10-CM | POA: Diagnosis not present

## 2016-06-10 DIAGNOSIS — R002 Palpitations: Secondary | ICD-10-CM | POA: Diagnosis not present

## 2016-06-10 DIAGNOSIS — R0789 Other chest pain: Secondary | ICD-10-CM | POA: Diagnosis not present

## 2016-06-10 DIAGNOSIS — J449 Chronic obstructive pulmonary disease, unspecified: Secondary | ICD-10-CM | POA: Diagnosis not present

## 2016-06-10 LAB — I-STAT TROPONIN, ED: Troponin i, poc: 0 ng/mL (ref 0.00–0.08)

## 2016-06-10 NOTE — Telephone Encounter (Signed)
Lmov for patient to call back and schedule ED fu from 06/10/16 was seen for CP

## 2016-06-24 ENCOUNTER — Telehealth: Payer: Self-pay | Admitting: Cardiovascular Disease

## 2016-06-24 ENCOUNTER — Inpatient Hospital Stay: Payer: Self-pay | Admitting: Family Medicine

## 2016-06-24 NOTE — Telephone Encounter (Signed)
Brittney BatonSharon H Yow, [name]      06/24/16 1:22 PM  Note    Pt called back requesting refills on amiodarone, valium and lipitor. States they were prescribed by her psychiatrist [facility]  Informed patient Dr. Kirke CorinArida is unable to provide refills for medications he did not prescribe and encouraged her to contact provider who originally prescribed them. Confirmed 10/10 OV w/Dr. Kirke CorinArida. She verbalized understanding and states she will contact either psychiatrist or PCP.

## 2016-06-24 NOTE — Telephone Encounter (Signed)
Pt states she needs refills on Lipitor and amiodarone and Valium. States she has an appt on 10/10. She asks if DR. Kirke CorinaRida still wants her to take these. Please call and advise.

## 2016-06-24 NOTE — Telephone Encounter (Signed)
Pt called back requesting refills on amiodarone, valium and lipitor. States they were prescribed by her psychiatrist [facility]  Informed patient Dr. Kirke CorinArida is unable to provide refills for medications he did not prescribe and encouraged her to contact provider who originally prescribed them. Confirmed 10/10 OV w/Dr. Kirke CorinArida. She verbalized understanding and states she will contact either psychiatrist or PCP.

## 2016-06-25 NOTE — Telephone Encounter (Signed)
Lmov for patient to call back and schedule ED fu from 06/10/16 was seen for CP

## 2016-06-27 ENCOUNTER — Ambulatory Visit (INDEPENDENT_AMBULATORY_CARE_PROVIDER_SITE_OTHER): Payer: Medicare Other | Admitting: Physician Assistant

## 2016-06-27 ENCOUNTER — Encounter: Payer: Self-pay | Admitting: Physician Assistant

## 2016-06-27 VITALS — BP 107/70 | HR 82 | Ht 59.0 in | Wt 173.4 lb

## 2016-06-27 DIAGNOSIS — Z95810 Presence of automatic (implantable) cardiac defibrillator: Secondary | ICD-10-CM

## 2016-06-27 DIAGNOSIS — E785 Hyperlipidemia, unspecified: Secondary | ICD-10-CM

## 2016-06-27 DIAGNOSIS — I639 Cerebral infarction, unspecified: Secondary | ICD-10-CM

## 2016-06-27 DIAGNOSIS — I480 Paroxysmal atrial fibrillation: Secondary | ICD-10-CM

## 2016-06-27 DIAGNOSIS — N183 Chronic kidney disease, stage 3 unspecified: Secondary | ICD-10-CM

## 2016-06-27 DIAGNOSIS — I10 Essential (primary) hypertension: Secondary | ICD-10-CM

## 2016-06-27 DIAGNOSIS — I2510 Atherosclerotic heart disease of native coronary artery without angina pectoris: Secondary | ICD-10-CM | POA: Diagnosis not present

## 2016-06-27 MED ORDER — AMIODARONE HCL 200 MG PO TABS: 400.0000 mg | ORAL_TABLET | Freq: Every day | ORAL | 5 refills | Status: AC

## 2016-06-27 MED ORDER — SERTRALINE HCL 100 MG PO TABS: 100.0000 mg | ORAL_TABLET | Freq: Every day | ORAL | 0 refills | Status: AC

## 2016-06-27 MED ORDER — SIMVASTATIN 20 MG PO TABS: 20.0000 mg | ORAL_TABLET | Freq: Every day | ORAL | 3 refills | Status: AC

## 2016-06-27 MED ORDER — DIAZEPAM 5 MG PO TABS: 5.0000 mg | ORAL_TABLET | Freq: Three times a day (TID) | ORAL | 0 refills | Status: AC | PRN

## 2016-06-27 NOTE — Patient Instructions (Addendum)
Medication Instructions:   TAKE AMIODARONE 400 MG DAILY UNTIL 07/03/16   START AMIODARONE 200 MG DAILY ON 07/04/16    If you need a refill on your cardiac medications before your next appointment, please call your pharmacy.  Labwork: RETURN IN 6 WEEKS LIPID AND LFT    Testing/Procedures: NONE ORDER TODAY'   Follow-Up: IN ONE TO TWO MONTHS WITH DR Ladona RidgelAYLOR    Any Other Special Instructions Will Be Listed Below (If Applicable).

## 2016-06-27 NOTE — Progress Notes (Signed)
Cardiology Office Note    Date:  06/27/2016   ID:  [name], [name] [birth year], MRN [id number]  PCP:  Clementeen Graham, MD  Cardiologist:  Dr. Kirke Corin EP: Dr. Ladona Ridgel  Chief Complaint  Patient presents with  . Hospitalization Follow-up    seen for Dr. Kirke Corin and Dr. Ladona Ridgel, post hospital admission at [facility] for recurrent atrial fibrillation    History of Present Illness:  [name] is a 65 y.o. 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 She also has past medical history of stroke, nonobstructive CAD, seizure, anxiety/depression, fibromuscular dysplasia, migraine headache, CKD stage III, HTN and HLD. Based on previous record, she was 26 when she was admitted to the hospital with a stroke. On telemetry, she spontaneously developed ventricular fibrillation and was successfully resuscitated. She was found to have long QT syndrome and was stung known drug to provoke her arrest. It was not bradycardic dependent. As referred to Dr. Ladona Ridgel for further evaluation and underwent successful implantation of Guidant single chamber ICD by Dr. Ladona Ridgel on 05/01/2003. Last echocardiogram obtained on 09/11/2012 showed EF 55-60%, mild LVH. She was last seen by Dr. Kirke Corin on 05/08/2015, she continues to smoke at the time and did not wish to quit. Her last TSH was normal on 06/10/2016.   Since then, patient has been admitted multiple times at [facility] for major depressive disorder and suicidal ideation. She also had recurrent atrial fibrillation as well. Last hospital note in 05/29/2016 indicate patient was still admitted in [facility] for suicidal ideation at the time. She recently was seen for chest discomfort with nausea and palpitation. If it was her usual symptom for atrial fibrillation. Her urinalysis was positive for UTI, she was treated with Bactrim. She was admitted after starting on IV diltiazem. While waiting for  echocardiogram prior to cardioversion, she spontaneously converted back to normal sinus rhythm. Echo showed normal EF, 3.7 cm ascending aorta. Her QTC was 402 on EKG in sinus rhythm afterward. Based on discharge summary on 05/23/2016, she was started on Multaq prior to discharge. Looks like she had recurrent atrial fibrillation a few days later, multaq was discontinued for medication failure, she was started on amiodarone 400 mg loading dose followed by de-escalating dose. It was mentioned patient was quite symptomatic even in rate controlled atrial fibrillation. Cardiologist at [facility] felt ablation therapy was should be considered given failure of multaq. However patient wished to discuss with Dr. Ladona Ridgel further. Her latest ED visit was on 06/10/2016.  Patient presents today for cardiology evaluation on 06/27/2016. Since her recent discharge, she has run out of amiodarone, Zoloft, Lipitor and Valium. According to the patient, she has asked PCP to give her some Valium, however her PCP wished to defer further prescription to her psychiatrist. She is not scheduled to see a psychiatrist for at least 2 more weeks and wish to have short-term prescription for Valium. I discussed with Dr. Herbie Baltimore, DOD, who is ok with 2 weeks Rx of valium since patient has been on the current dose for many years and to prevent possible withdrawal. She understand that we will not give her any more benzodiazepine and if she needs more she will need to discuss with her psychiatrist. I have given her a total of 42 tablets of 5 mg Valium used as needed 3 times daily. From atrial fibrillation perspective, she still have occasional recurrence of palpitation and chest pain since  her recent discharge. However amiodarone seems to be helping and suppresses her symptom better than previous multaq. She only has chest discomfort when she was in atrial fibrillation, otherwise she has no chest pain. She believes her anxiety is driving her recurrence of  atrial fibrillation. She wished to discuss with Dr. Ladona Ridgel regarding atrial fibrillation ablation and appears to be quite interested in the procedure. Her EKG today shows she is in sinus rhythm. Interrogation today shows she has a very limited activity with only one hour per day, fortunately she has not had a shock therapy since last interrogation in 2015. She does have over 500 episode of ventricular ectopy. Otherwise she says she has been doing well. Her EKG also shows her QTC level is 440. She says she has been on Zoloft for many years. She has not had any noticeable increase on cardiac QTC. She says after her Lipitor was ran out, she has restarted her old 20 mg Zocor. I am fine with this. However she is on multiple medication that can potentially cause liver injury including Zocor and amiodarone. I have asked her to obtain a repeat fasting lipid lab and also LFT on her next follow-up.  Considering she has been started on amiodarone and has no beta blocker at this time, the question is relief she will go through with atrial fibrillation ablation procedure. Her last TSH on 04/15/2016 was normal. If she will be on amiodarone long term, besides stop coming LFT, she will need outpatient PFT and annual eye exam.   Past Medical History:  Diagnosis Date  . Allergy   . Anxiety   . Anxiety disorder   . Arthritis of knee    Bilateral  . Atrial fibrillation (HCC)    a) 2D echo 09/11/12: LVEF 55-60%, mild LVH, mld LA dilatation  . Automatic implantable cardiac defibrillator in situ    [employer] ICD  . CAD (coronary artery disease)   . Cardiac arrest (HCC)   . Cerebrovascular disease   . COPD (chronic obstructive pulmonary disease) (HCC)   . Coronary artery disease    non obstructive  . Depression   . Depression   . Fibromuscular dysplasia (HCC)   . HLD (hyperlipidemia)   . HTN (hypertension)   . Long QT syndrome   . Migraine   . Migraine headache   . Seizure disorder (HCC)   . Seizures  (HCC)   . Stroke [facility])    no deficits  . Tobacco abuse   . Tobacco abuse   . Ventricular fibrillation [facility])     Past Surgical History:  Procedure Laterality Date  . APPENDECTOMY    . BREAST LUMPECTOMY    . CARDIAC DEFIBRILLATOR PLACEMENT    . CHOLECYSTECTOMY    . COSMETIC SURGERY    . PACEMAKER INSERTION  05/01/2003   ICD, implantation of a Guidant single chamber defibrillator, Doylene Canning. Ladona Ridgel MD  . TUBAL LIGATION      Current Medications: Outpatient Medications Prior to Visit  Medication Sig Dispense Refill  . acetaminophen (TYLENOL) 500 MG tablet Take 1,000 mg by mouth every 6 (six) hours as needed for mild pain, moderate pain or headache.     . diphenhydrAMINE (BENADRYL) 25 MG tablet Take 50 mg by mouth every 6 (six) hours as needed for allergies.     . fluticasone (FLONASE) 50 MCG/ACT nasal spray Place 2 sprays into both nostrils daily as needed for rhinitis.     . Rivaroxaban (XARELTO) 15 MG TABS tablet Take 15  mg by mouth at bedtime.    Marland Kitchen amiodarone (PACERONE) 200 MG tablet Take 400 mg by mouth 2 (two) times daily. Started 06/13/16 start taking 400mg  daily.    Marland Kitchen atorvastatin (LIPITOR) 10 MG tablet Take 10 mg by mouth every evening.    . diazepam (VALIUM) 2 MG tablet Take 1 tablet (2 mg total) by mouth 3 (three) times daily as needed for anxiety. (Patient taking differently: Take 5 mg by mouth 3 (three) times daily. ) 30 tablet 0  . sertraline (ZOLOFT) 100 MG tablet Take 100 mg by mouth at bedtime.      No facility-administered medications prior to visit.      Allergies:   Azithromycin; Ibuprofen; Codeine; and Demerol [meperidine]   Social History   Social History  . Marital status: Divorced    Spouse name: N/A  . Number of children: N/A  . Years of education: N/A   Occupational History  . Disabled    Social History Main Topics  . Smoking status: Current Some Day Smoker    Packs/day: 0.25    Years: 40.00    Types: Cigarettes  . Smokeless tobacco: Never Used      Comment: almost a pack a week  . Alcohol use No  . Drug use: No  . Sexual activity: No   Other Topics Concern  . None   Social History Narrative   Lives in [place]   On disability for her osteoarthritis   Recently estranged from her husband     Family History:  The patient's family history includes Aneurysm in her sister; Cancer in her sister; Colon cancer in her sister; Dementia in her mother; Stroke in her mother and sister; Uterine cancer in her other.   ROS:   Please see the history of present illness.    ROS All other systems reviewed and are negative.   PHYSICAL EXAM:   VS:  BP 107/70   Pulse 82   Ht 4\' 11"  (1.499 m)   Wt 173 lb 6.4 oz (78.7 kg)   BMI 35.02 kg/m    GEN: Well nourished, well developed, in no acute distress  HEENT: normal  Neck: no JVD, carotid bruits, or masses Cardiac: RRR; no murmurs, rubs, or gallops,no edema  Respiratory:  clear to auscultation bilaterally, normal work of breathing GI: soft, nontender, nondistended, + BS MS: no deformity or atrophy  Skin: warm and dry, no rash Neuro:  Alert and Oriented x 3, Strength and sensation are intact Psych: euthymic mood, full affect  Wt Readings from Last 3 Encounters:  06/27/16 173 lb 6.4 oz (78.7 kg)  06/09/16 167 lb (75.8 kg)  04/08/16 172 lb (78 kg)      Studies/Labs Reviewed:   EKG:  EKG is ordered today.  The ekg ordered today demonstrates Normal sinus rhythm without significant ST-T wave changes.  Recent Labs: 05/20/2016: ALT 13 06/09/2016: BUN 24; Creatinine, Ser 1.75; Hemoglobin 12.3; Magnesium 2.1; Platelets 266; Potassium 3.5; Sodium 138   Lipid Panel    Component Value Date/Time   CHOL 205 (H) 01/05/2012 1113   TRIG 117 01/05/2012 1113   HDL 39 (L) 01/05/2012 1113   CHOLHDL 5.5 CALC 09/27/2008 1211   VLDL 23 01/05/2012 1113   LDLCALC 143 (H) 01/05/2012 1113   LDLDIRECT 105.9 09/27/2008 1211    Additional studies/ records that were reviewed today include:    Cath  04/27/2003 PROCEDURES PERFORMED:  1. Left heart catheterization.  2. Selective coronary angiography.  3. Left ventriculography  HEMODYNAMICS:  1. Left ventricle 156/23 mmHg.  2. Aorta 158/105 mmHg.   ANGIOGRAPHIC FINDINGS:  1. The left main coronary artery is free of significant flow limiting     coronary atherosclerosis.  2. The left anterior descending is a medium caliber vessel with two diagonal     branches proximally.  There is 20% diffuse stenosis in the proximal to     mid vessel with no major flow limiting stenoses.  3. The circumflex coronary artery is a large caliber vessel with three     obtuse marginal branches.  There is a 20% stenosis in the proximal vessel     followed by an ectatic segment without flow limiting disease.  4. The right coronary artery is dominant and medium in caliber.  There are     minor luminal irregularities within this system.   LEFT VENTRICULOGRAPHY:  Left ventriculography is performed in the RAO  projection revealing an ejection fraction of 60-65% with trace mitral  regurgitation in the setting of ventricular ectopy.   DIAGNOSES:  1. Mild coronary atherosclerosis as outlined without major flow limiting     stenoses in the epicardial vessels.  2. Left ventricular ejection fraction of 60-65% with trace mitral     regurgitation in the setting of ventricular ectopy.   Implantation of a single-chamber implantable cardioverter-defibrillator by Dr. Ladona Ridgelaylor 2004   INDICATIONS:  Spontaneous sustained ventricular fibrillation due to long QT  syndrome.  Guidant Prizm II-VR AICD, serial number [id number], single-chamber defibrillator  Guidant model [id number] defibrillation lead    RESULTS:  This demonstrates successful implantation of a Guidant single-  chamber defibrillator in a patient with resuscitated ventricular  fibrillation arrest in the setting of long QT syndrome.   Echo 09/11/2012 LV EF: 55% -   60%  ------------------------------------------------------------ Indications:   Atrial fibrillation - 427.31.  ------------------------------------------------------------ History:  PMH:  Coronary artery disease. Chronic obstructive pulmonary disease. Risk factors: Long QT syndrome. Cardiac arrest. ICD. Seizure disorder. Current tobacco use.  ------------------------------------------------------------ Study Conclusions  - Left ventricle: The cavity size was normal. Wall thickness was increased in a pattern of mild LVH. Systolic function was normal. The estimated ejection fraction was in the range of 55% to 60%. - Left atrium: The atrium was mildly dilated. - Atrial septum: No defect or patent foramen ovale was identified.   Echo 05/23/2016 Summary:  1. The study quality is technically difficult.  2. The left ventricular chamber size is normal. 3. Left ventricle septal thickness is mildly increased. 4. There is normal left ventricular systolic function. The EF is estimated at 55-60%. 5. There is mild mitral regurgitation observed. 6. There is mild tricuspid regurgitation. 7. The aorta measures 3.7 cm at the proximal ascending aorta which is dilated for a female (reference 1.9-3.5).       ASSESSMENT:    1. Paroxysmal atrial fibrillation (HCC)   2. Coronary artery disease involving native heart without angina pectoris, unspecified vessel or lesion type   3. Cerebrovascular accident (CVA), unspecified mechanism (HCC)   4. CKD (chronic kidney disease), stage III   5. Essential hypertension   6. Hyperlipidemia, unspecified hyperlipidemia type   7. ICD (implantable cardioverter-defibrillator) in place      PLAN:  In order of problems listed above:  1. PAF  - This patients CHA2DS2-VASc Score and unadjusted Ischemic Stroke Rate (% per year) is equal to 7.2 % stroke rate/year from a score of 5  Above score calculated as 1 point each if present [CHF, HTN,  DM, Vascular=MI/PAD/Aortic Plaque, Age if 67-74, or Female] Above score calculated as 2 points each if present [Age > 75, or Stroke/TIA/TE]  - Unfortunately due to her blood pressure issues, she is no longer on beta blocker, the only thing that is controlling her atrial fibrillation is amiodarone, even so, she continued to have episodic atrial fibrillation. She has finished the loading dose, I will decrease her amiodarone from 400 mg daily to 200 mg daily next Friday on 07/04/2016 after continuing on 400 mg daily for a total of 2 weeks (previously on 400mg  TID during loading).  - I will refer her back to Dr. Ladona Ridgel for consideration of atrial fibrillation ablation procedure. Depending on the duration of amiodarone therapy and upcoming ablation procedure, if she will be on amiodarone therapy for a long period, she will need liver function test, pulmonary function test, and annual eye exam. I have explained to her that multiple toxicity associated with high-dose amiodarone, does require annual check. She is due for her fasting lipid panel and LFTs in 2 months, we plan to do this on follow-up.  2. CAD: Last cath 2004, minimal CAD, she does occasionally have chest pain but only when she goes into atrial fibrillation, otherwise she has no chest pain.  3. CKD stage III: Recent labs reviewed from [facility]  4. HTN: Blood pressure borderline today, she has been taken off of her metoprolol.  5. HLD: She has been switched back to previous Zocor 20 mg. She will need a fasting lipid panel in 2 months along with LFT  6. H/o cardiac arrest 2/2 long QT syndrome s/p Guidant single lead ICD by Dr. Ladona Ridgel 2004: Has not seen Dr. Ladona Ridgel in many years, her device has not been regularly checked, last check was in 2015. We did check patient's device today, she has not received any shock since the last interrogation. I will refer the patient to follow-up with Dr. Ladona Ridgel in one to 2 month.  7.  Anxiety: She  has run out of her Zoloft and Valium, after discussing with DOD Dr. Herbie Baltimore, we will give her 2 weeks of Valium and also one month prescription of Zoloft, she understands clearly she will not receive any further prescription from cardiology service for benzodiazepine, she will need to see her psychiatrist to obtain this medication. Even though our system says she is on 2 mg every 8 hours of Valium, according to the patient, she was on 5 mg every 8 hours instead, I have verified as this based on record from [facility], we have updated the dose of our record based on her recent admission note.   Medication Adjustments/Labs and Tests Ordered: Current medicines are reviewed at length with the patient today.  Concerns regarding medicines are outlined above.  Medication changes, Labs and Tests ordered today are listed in the Patient Instructions below. Patient Instructions  Medication Instructions:   TAKE AMIODARONE 400 MG DAILY UNTIL 07/03/16   START AMIODARONE 200 MG DAILY ON 07/04/16    If you need a refill on your cardiac medications before your next appointment, please call your pharmacy.  Labwork: RETURN IN 6 WEEKS LIPID AND LFT    Testing/Procedures: NONE ORDER TODAY'   Follow-Up: IN ONE TO TWO MONTHS WITH DR Ladona Ridgel    Any Other Special Instructions Will Be Listed Below (If Applicable).  Ramond Dial, [place]  06/27/2016 11:27 PM    [facility] [address] [place], [place], [place]  [address] Phone: [phone]; Fax: [phone]

## 2016-07-01 ENCOUNTER — Ambulatory Visit: Payer: Medicare Other | Admitting: Cardiovascular Disease

## 2016-07-01 ENCOUNTER — Encounter: Payer: Self-pay | Admitting: *Deleted

## 2016-07-01 IMAGING — CR DG CHEST 2V
2 series · 2 of 2 positions shown · non-contrast
Comparison: Radiograph 05/13/2014

CLINICAL DATA: ATRIAL FIBRILLATION

EXAM:
CHEST  2 VIEW

[w chest lat]
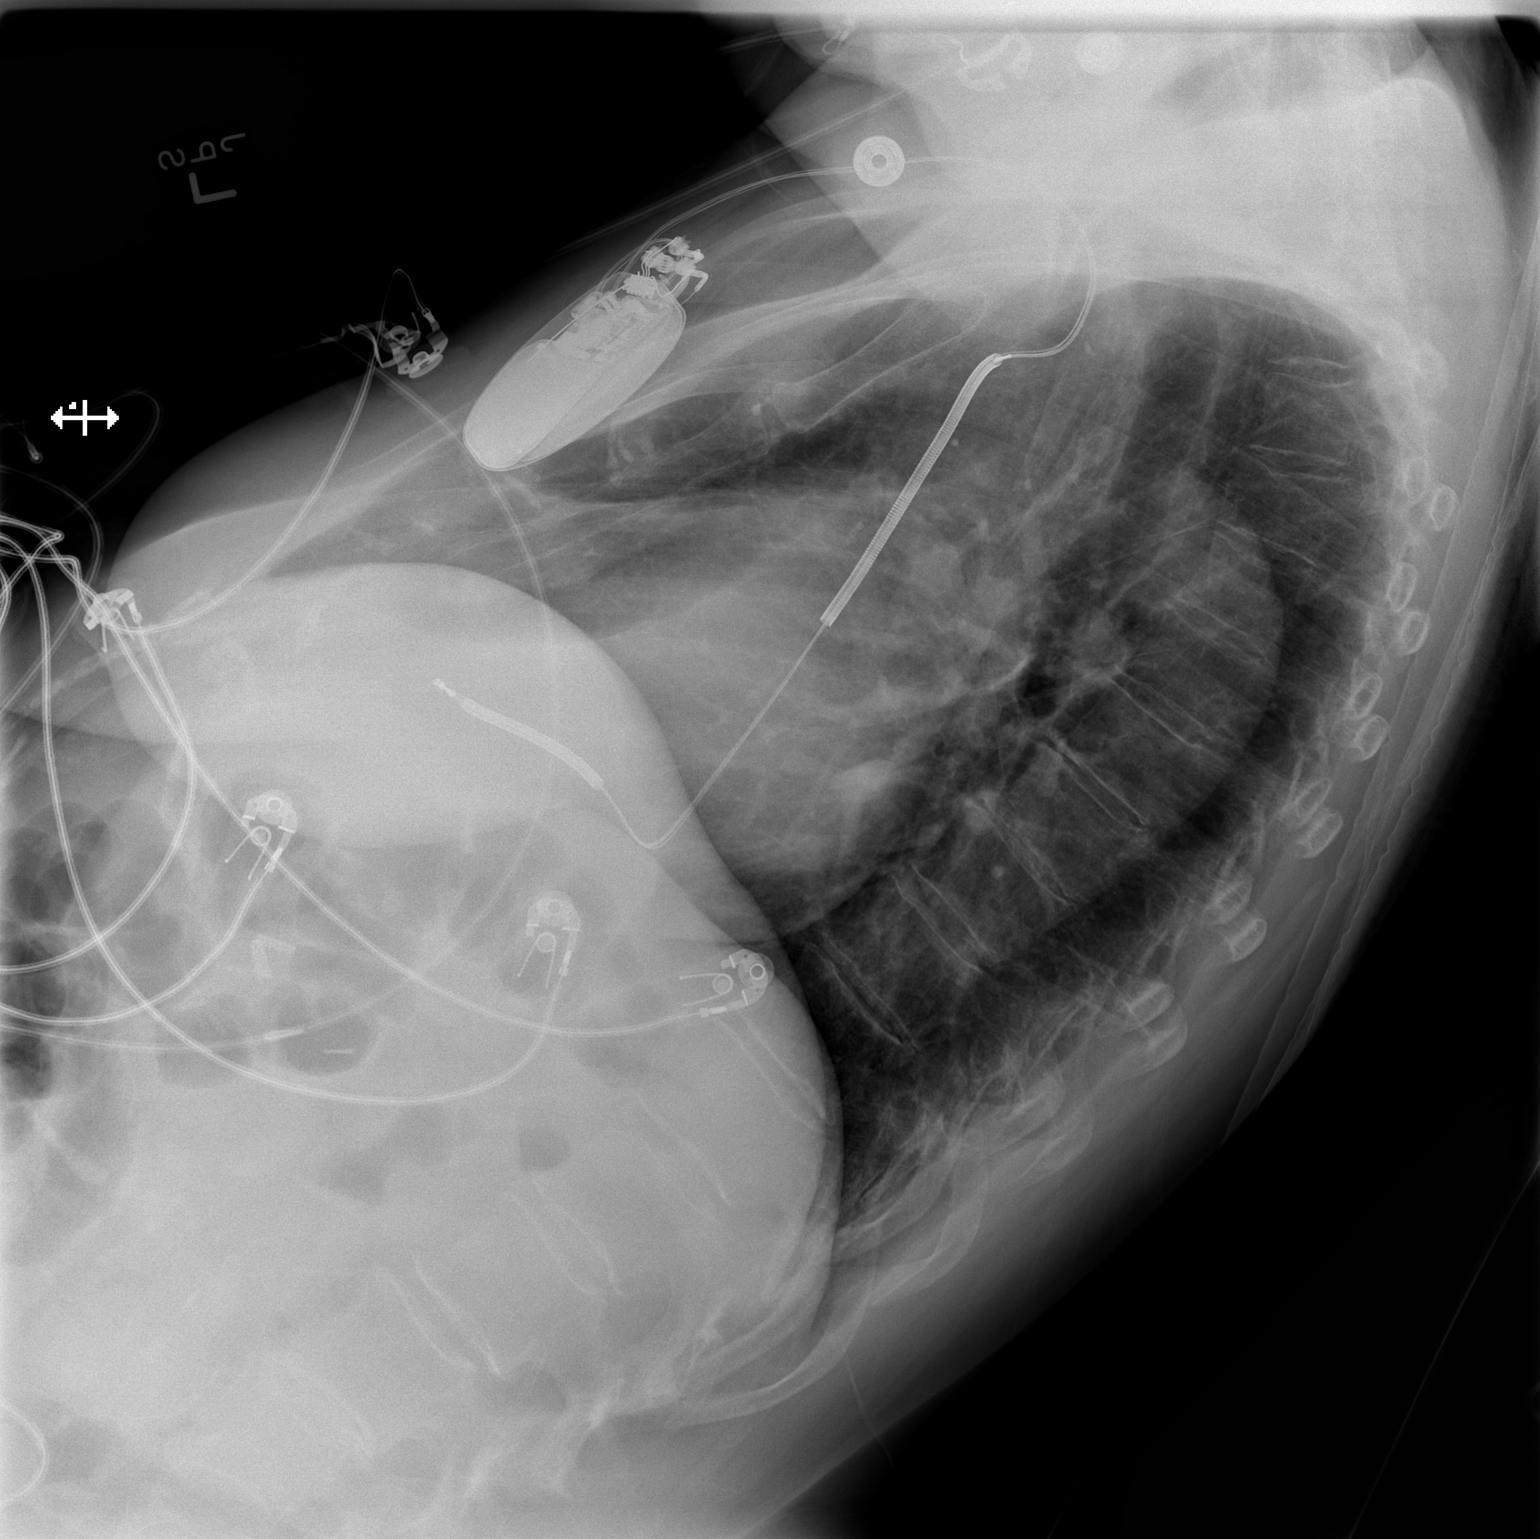

[w chest pa]
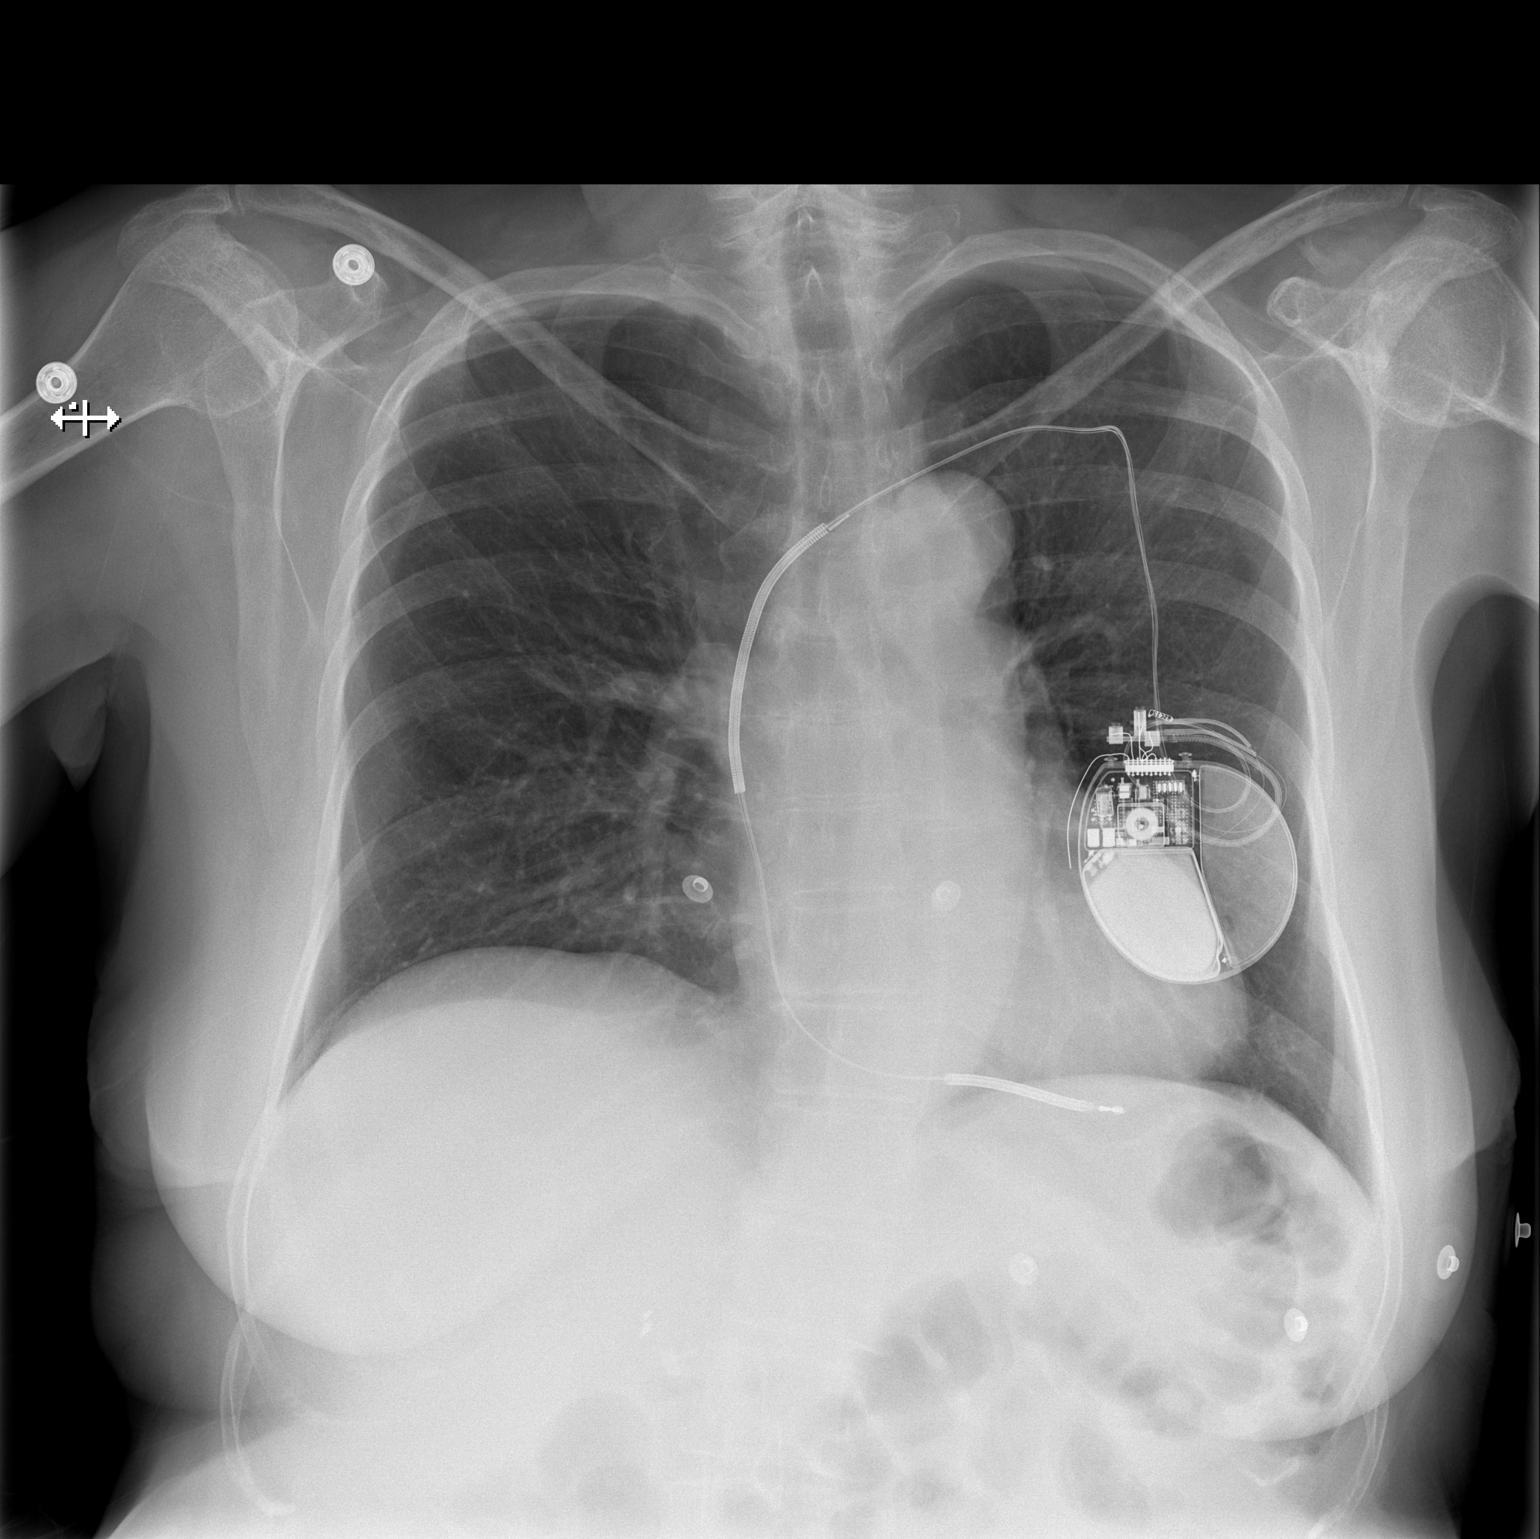

[2 of 2 positions shown; findings below may reference images not displayed]

FINDINGS: Left-sided pacemaker overlies normal cardiac silhouette. Lungs are
clear. No pulmonary edema or infiltrate. No pneumothorax.
IMPRESSION: No acute cardiopulmonary process.

## 2016-07-01 NOTE — Telephone Encounter (Signed)
Lmov for patient to call back and schedule ED fu from 06/10/16 was seen for CP °

## 2016-07-07 ENCOUNTER — Encounter: Payer: Self-pay | Admitting: Internal Medicine

## 2016-07-09 NOTE — Telephone Encounter (Signed)
Unable to contact

## 2016-07-10 ENCOUNTER — Encounter: Payer: Self-pay | Admitting: Physician Assistant

## 2016-07-10 ENCOUNTER — Other Ambulatory Visit: Payer: Self-pay | Admitting: *Deleted

## 2016-07-10 MED ORDER — RIVAROXABAN 15 MG PO TABS: 15.0000 mg | ORAL_TABLET | Freq: Every day | ORAL | 3 refills | Status: AC

## 2016-07-10 NOTE — Telephone Encounter (Signed)
This encounter was created in error - please disregard.

## 2016-07-10 NOTE — Telephone Encounter (Signed)
I called [name] in regards to patient. He states he did not call this office. I called [phone], [phone], and [phone]. I was unable to leave a message at any of the numbers. We will attempt to contact again later.

## 2016-07-10 NOTE — Telephone Encounter (Signed)
errr pt has concerns rr she wants to go over

## 2016-07-20 NOTE — Progress Notes (Deleted)
Cardiology Office Note Date:  07/20/2016  Patient ID:  [name], DOB 01/17/1951, MRN [id number] PCP:  Clementeen GrahamEvan Corey, MD  Cardiologist:  Dr. Kirke CorinArida Electrophysiologist: Dr. Ladona Ridgelaylor  ***refresh   Chief Complaint: planned 65mo visit  History of Present Illness: [name] is a 65 y.o. female with history of VFarrest/QT prolongation w/ICD, PAFib, CVA, seizure d/o, nonobstructive CAD, seizure, anxiety/depression, fibromuscular dysplasia, migraine headache, CKD stage III, HTN and HLD.  The patient has been admitted multiple times at [facility] for major depressive disorder and suicidal ideation. She also had recurrent atrial fibrillation as well. Last hospital note in 05/29/2016 indicate patient was still admitted in [facility] for suicidal ideation at the time. She recently was seen for chest discomfort with nausea and palpitation. If it was her usual symptom for atrial fibrillation. Her urinalysis was positive for UTI, she was treated with Bactrim. She was admitted after starting on IV diltiazem. While waiting for echocardiogram prior to cardioversion, she spontaneously converted back to normal sinus rhythm. Echo showed normal EF, 3.7 cm ascending aorta. Her QTC was 402 on EKG in sinus rhythm afterward. Based on discharge summary on 05/23/2016, she was started on Multaq prior to discharge. Looks like she had recurrent atrial fibrillation a few days later, multaq was discontinued for medication failure, she was started on amiodarone 400 mg loading dose followed by de-escalating dose. It was mentioned patient was quite symptomatic even in rate controlled atrial fibrillation. Cardiologist at [facility] felt ablation therapy was should be considered given failure of multaq. However patient wished to discuss with Dr. Ladona Ridgelaylor further.  She was last seen by cardiology, Azalee CourseHao Meng, PA-C last month, at that time she had run out of her zoloft,  amio and valium requesting refills, she was given refills and, ws  in SR and her amio down-titrated, she was last seen by Dr. Kirke CorinArida in 2016.  She comes in today to be seen for Dr. Ladona Ridgelaylor, last seen by him it appears in 2012, today though to discuss AF ablation as a tx option.   *** symptoms *** no BB therapy secondary to bP?/ *** shocks, syncope *** meds *** bleeding   AFib Hx: Multaq failed to maintain SR Amiodarone  DEVICE information: BSCi single lead ICD, implanted 05/01/03, gen change 03/17/11, Dr. Ladona Ridgelaylor, VF arrest/long QT  Past Medical History:  Diagnosis Date  . Allergy   . Anxiety   . Anxiety disorder   . Arthritis of knee    Bilateral  . Atrial fibrillation (HCC)    a) 2D echo 09/11/12: LVEF 55-60%, mild LVH, mld LA dilatation  . Automatic implantable cardiac defibrillator in situ    [employer] ICD  . CAD (coronary artery disease)   . Cardiac arrest (HCC)   . Cerebrovascular disease   . COPD (chronic obstructive pulmonary disease) (HCC)   . Coronary artery disease    non obstructive  . Depression   . Depression   . Fibromuscular dysplasia (HCC)   . HLD (hyperlipidemia)   . HTN (hypertension)   . Long QT syndrome   . Migraine   . Migraine headache   . Seizure disorder (HCC)   . Seizures (HCC)   . Stroke [facility](HCC)    no deficits  . Tobacco abuse   . Tobacco abuse   . Ventricular fibrillation [facility](HCC)     Past Surgical History:  Procedure Laterality Date  . APPENDECTOMY    . BREAST LUMPECTOMY    . CARDIAC DEFIBRILLATOR PLACEMENT    .  CHOLECYSTECTOMY    . COSMETIC SURGERY    . PACEMAKER INSERTION  05/01/2003   ICD, implantation of a Guidant single chamber defibrillator, Doylene CanningGregg W. Ladona Ridgelaylor MD  . TUBAL LIGATION      Current Outpatient Prescriptions  Medication Sig Dispense Refill  . acetaminophen (TYLENOL) 500 MG tablet Take 1,000 mg by mouth every 6 (six) hours as needed for mild pain, moderate pain or headache.     Marland Kitchen. amiodarone (PACERONE) 200 MG tablet Take 2 tablets (400 mg total) by mouth daily. Until 07/03/16 on  07/04/16  START TAKING 200 MG DAILY 60 tablet 5  . diazepam (VALIUM) 5 MG tablet Take 1 tablet (5 mg total) by mouth 3 (three) times daily as needed for anxiety. 42 tablet 0  . diphenhydrAMINE (BENADRYL) 25 MG tablet Take 50 mg by mouth every 6 (six) hours as needed for allergies.     . fluticasone (FLONASE) 50 MCG/ACT nasal spray Place 2 sprays into both nostrils daily as needed for rhinitis.     . Rivaroxaban (XARELTO) 15 MG TABS tablet Take 1 tablet (15 mg total) by mouth at bedtime. 30 tablet 3  . sertraline (ZOLOFT) 100 MG tablet Take 1 tablet (100 mg total) by mouth at bedtime. 30 tablet 0  . simvastatin (ZOCOR) 20 MG tablet Take 1 tablet (20 mg total) by mouth at bedtime. 90 tablet 3   No current facility-administered medications for this visit.     Allergies:   Azithromycin; Ibuprofen; Codeine; and Demerol [meperidine]   Social History:  The patient  reports that she has been smoking Cigarettes.  She has a 10.00 pack-year smoking history. She has never used smokeless tobacco. She reports that she does not drink alcohol or use drugs.   Family History:  The patient's family history includes Aneurysm in her sister; Cancer in her sister; Colon cancer in her sister; Dementia in her mother; Stroke in her mother and sister; Uterine cancer in her other.***  ROS:  Please see the history of present illness. Otherwise, review of systems is positive for ***.   All other systems are reviewed and otherwise negative.   PHYSICAL EXAM: *** VS:  There were no vitals taken for this visit. BMI: There is no height or weight on file to calculate BMI. Well nourished, well developed, in no acute distress  HEENT: normocephalic, atraumatic  Neck: no JVD, carotid bruits or masses Cardiac:  normal S1, S2; RRR; no significant murmurs, no rubs, or gallops Lungs:  clear to auscultation bilaterally, no wheezing, rhonchi or rales  Abd: soft, nontender,  + BS MS: no deformity or atrophy Ext: no edema  Skin: warm  and dry, no rash Neuro:  No gross deficits appreciated Psych: euthymic mood, full affect  ***ICD site is stable, no tethering or discomfort   EKG:  Done  06/27/16 showed SR, 1st degree AVblock with PR of 252ms, her Qtc 446ms ICD interrogation today and reviewed by myself: ***   Echo 05/23/2016 Summary:  1. The study quality is technically difficult.  2. The left ventricular chamber size is normal. 3. Left ventricle septal thickness is mildly increased. 4. There is normal left ventricular systolic function. The EF is estimated at 55-60%. 5. There is mild mitral regurgitation observed. 6. There is mild tricuspid regurgitation. 7. The aorta measures 3.7 cm at the proximal ascending aorta which is dilated for a female (reference 1.9-3.5). LA 33mm   Cath 04/27/2003 PROCEDURES PERFORMED: 1. Left heart catheterization. 2. Selective coronary angiography. 3. Left ventriculography  HEMODYNAMICS: 1. Left ventricle 156/23 mmHg. 2. Aorta 158/105 mmHg.  ANGIOGRAPHIC FINDINGS: 1. The left main coronary artery is free of significant flow limiting coronary atherosclerosis. 2. The left anterior descending is a medium caliber vessel with two diagonal branches proximally. There is 20% diffuse stenosis in the proximal to mid vessel with no major flow limiting stenoses. 3. The circumflex coronary artery is a large caliber vessel with three obtuse marginal branches. There is a 20% stenosis in the proximal vessel followed by an ectatic segment without flow limiting disease. 4. The right coronary artery is dominant and medium in caliber. There are minor luminal irregularities within this system. LEFT VENTRICULOGRAPHY: Left ventriculography is performed in the RAO projection revealing an ejection fraction of 60-65% with trace mitral regurgitation in the setting of ventricular ectopy. DIAGNOSES: 1. Mild coronary atherosclerosis as outlined without  major flow limiting stenoses in the epicardial vessels. 2. Left ventricular ejection fraction of 60-65% with trace mitral regurgitation in the setting of ventricular ectopy.    Recent Labs: 05/20/2016: ALT 13 06/09/2016: BUN 24; Creatinine, Ser 1.75; Hemoglobin 12.3; Magnesium 2.1; Platelets 266; Potassium 3.5; Sodium 138  No results found for requested labs within last 8760 hours.   CrCl cannot be calculated (Unknown ideal weight.).   Wt Readings from Last 3 Encounters:  06/27/16 173 lb 6.4 oz (78.7 kg)  06/09/16 167 lb (75.8 kg)  04/08/16 172 lb (78 kg)     Other studies reviewed: Additional studies/records reviewed today include: summarized above  ASSESSMENT AND PLAN:  1. PAfib      CHA2DS2Vasc is at least 5, on Xarelto (15mg  with Calc Cr clearance of 39)    *** symptomatic with CP during exacerbations  2. Long QT, hx of VF arrest w/ICD    *** stable device function  3. CRI, stage III  4. HTN     *** stable  Disposition: F/u with ***  Current medicines are reviewed at length with the patient today.  The patient did not have any concerns regarding medicines.***  Judith Blonder, PA-C 07/20/2016 9:54 AM     [facility] [address] [place] [address] [phone] (office)  [phone] (fax)

## 2016-07-21 ENCOUNTER — Encounter: Payer: Self-pay | Admitting: Physician Assistant

## 2016-07-23 ENCOUNTER — Encounter: Payer: Self-pay | Admitting: Cardiology

## 2016-07-23 NOTE — Progress Notes (Deleted)
Electrophysiology Office Note   Date:  07/23/2016   ID:  [name], DOB 01/07/1951, MRN [id number]  PCP:  Clementeen GrahamEvan Corey, MD  Cardiologist:  Kirke CorinArida Primary Electrophysiologist:Taylor  Yisrael Obryan Jorja LoaMartin Oddie Bottger, MD    No chief complaint on file.    History of Present Illness: [name] Noone is a 65 y.o. female who presents today for electrophysiology evaluation.   PMH of VF arrest due to QT prolongation s/p ICD insertion, history of paroxysmal atrial fibrillation on Xarelto. She also has past medical history of stroke, nonobstructive CAD, seizure, anxiety/depression, fibromuscular dysplasia, migraine headache, CKD stage III, HTN and HLD.   Based on previous record, she was [age] when she was admitted to the hospital with a stroke. On telemetry, she spontaneously developed ventricular fibrillation and was successfully resuscitated. She was found to have long QT syndrome. Underwent successful implantation of Guidant single chamber ICD on 05/01/2003.  Since then, patient has been admitted [facility] times at [facility] for major depressive disorder and suicidal ideation. She also had recurrent atrial fibrillation as well.  She recently was seen for chest discomfort with nausea and palpitation. If it was her usual symptom for atrial fibrillation. While waiting for echocardiogram prior to cardioversion, she spontaneously converted back to normal sinus rhythm. Echo showed normal EF, 3.7 cm ascending aorta. Her QTC was 402 on EKG in sinus rhythm afterward. Based on discharge summary on 05/23/2016, she was started on Multaq prior to discharge. Recurrent atrial fibrillation a few days later, multaq was discontinued for medication failure, she was started on amiodarone 400 mg loading dose followed by de-escalating dose.  Today, she denies*** symptoms of palpitations, chest pain, shortness of breath, orthopnea, PND, lower extremity edema, claudication, dizziness, presyncope, syncope, bleeding, or neurologic  sequela. The patient is tolerating medications without difficulties and is otherwise without complaint today.    Past Medical History:  Diagnosis Date  . Allergy   . Anxiety   . Anxiety disorder   . Arthritis of knee    Bilateral  . Atrial fibrillation (HCC)    a) 2D echo 09/11/12: LVEF 55-60%, mild LVH, mld LA dilatation  . Automatic implantable cardiac defibrillator in situ    [employer] ICD  . CAD (coronary artery disease)   . Cardiac arrest (HCC)   . Cerebrovascular disease   . COPD (chronic obstructive pulmonary disease) (HCC)   . Coronary artery disease    non obstructive  . Depression   . Depression   . Fibromuscular dysplasia (HCC)   . HLD (hyperlipidemia)   . HTN (hypertension)   . Long QT syndrome   . Migraine   . Migraine headache   . Seizure disorder (HCC)   . Seizures (HCC)   . Stroke [facility](HCC)    no deficits  . Tobacco abuse   . Tobacco abuse   . Ventricular fibrillation [facility](HCC)    Past Surgical History:  Procedure Laterality Date  . APPENDECTOMY    . BREAST LUMPECTOMY    . CARDIAC DEFIBRILLATOR PLACEMENT    . CHOLECYSTECTOMY    . COSMETIC SURGERY    . PACEMAKER INSERTION  05/01/2003   ICD, implantation of a Guidant single chamber defibrillator, Doylene CanningGregg W. Ladona Ridgelaylor MD  . TUBAL LIGATION       Current Outpatient Prescriptions  Medication Sig Dispense Refill  . acetaminophen (TYLENOL) 500 MG tablet Take 1,000 mg by mouth every 6 (six) hours as needed for mild pain, moderate pain or headache.     Marland Kitchen. amiodarone (PACERONE)  200 MG tablet Take 2 tablets (400 mg total) by mouth daily. Until 07/03/16 on 07/04/16  START TAKING 200 MG DAILY 60 tablet 5  . diazepam (VALIUM) 5 MG tablet Take 1 tablet (5 mg total) by mouth 3 (three) times daily as needed for anxiety. 42 tablet 0  . diphenhydrAMINE (BENADRYL) 25 MG tablet Take 50 mg by mouth every 6 (six) hours as needed for allergies.     . fluticasone (FLONASE) 50 MCG/ACT nasal spray Place 2 sprays into both  nostrils daily as needed for rhinitis.     . Rivaroxaban (XARELTO) 15 MG TABS tablet Take 1 tablet (15 mg total) by mouth at bedtime. 30 tablet 3  . sertraline (ZOLOFT) 100 MG tablet Take 1 tablet (100 mg total) by mouth at bedtime. 30 tablet 0  . simvastatin (ZOCOR) 20 MG tablet Take 1 tablet (20 mg total) by mouth at bedtime. 90 tablet 3   No current facility-administered medications for this visit.     Allergies:   Azithromycin; Ibuprofen; Codeine; and Demerol [meperidine]   Social History:  The patient  reports that she has been smoking Cigarettes.  She has a 10.00 pack-year smoking history. She has never used smokeless tobacco. She reports that she does not drink alcohol or use drugs.   Family History:  The patient's family history includes Aneurysm in her sister; Cancer in her sister; Colon cancer in her sister; Dementia in her mother; Stroke in her mother and sister; Uterine cancer in her other.    ROS:  Please see the history of present illness.   Otherwise, review of systems is positive for ***.   All other systems are reviewed and negative.    PHYSICAL EXAM: VS:  There were no vitals taken for this visit. , BMI There is no height or weight on file to calculate BMI. GEN: Well nourished, well developed, in no acute distress  HEENT: normal  Neck: no JVD, carotid bruits, or masses Cardiac: ***RRR; no murmurs, rubs, or gallops,no edema  Respiratory:  clear to auscultation bilaterally, normal work of breathing GI: soft, nontender, nondistended, + BS MS: no deformity or atrophy  Skin: warm and dry, *** device pocket is well healed Neuro:  Strength and sensation are intact Psych: euthymic mood, full affect  EKG:  EKG {ACTION; IS/IS ZOX:[id number]} ordered today. Personal review of the ekg ordered shows ***  *** Device interrogation is reviewed today in detail.  See PaceArt for details.   Recent Labs: 05/20/2016: ALT 13 06/09/2016: BUN 24; Creatinine, Ser 1.75; Hemoglobin 12.3;  Magnesium 2.1; Platelets 266; Potassium 3.5; Sodium 138    Lipid Panel     Component Value Date/Time   CHOL 205 (H) 01/05/2012 1113   TRIG 117 01/05/2012 1113   HDL 39 (L) 01/05/2012 1113   CHOLHDL 5.5 CALC 09/27/2008 1211   VLDL 23 01/05/2012 1113   LDLCALC 143 (H) 01/05/2012 1113   LDLDIRECT 105.9 09/27/2008 1211     Wt Readings from Last 3 Encounters:  06/27/16 173 lb 6.4 oz (78.7 kg)  06/09/16 167 lb (75.8 kg)  04/08/16 172 lb (78 kg)      Other studies Reviewed: Additional studies/ records that were reviewed today include: TTE 2013  Review of the above records today demonstrates:  - Left ventricle: The cavity size was normal. Wall thickness was increased in a pattern of mild LVH. Systolic function was normal. The estimated ejection fraction was in the range of 55% to 60%. - Left atrium: The atrium was  mildly dilated. - Atrial septum: No defect or patent foramen ovale was identified.   ASSESSMENT AND PLAN:  1.  Paroxysmal atrial fibrillation: on amiodarone and Xarelto.  This patients CHA2DS2-VASc Score and unadjusted Ischemic Stroke Rate (% per year) is equal to 4.8 % stroke rate/year from a score of 4  Above score calculated as 1 point each if present [CHF, HTN, DM, Vascular=MI/PAD/Aortic Plaque, Age if 65-74, or Female] Above score calculated as 2 points each if present [Age > 75, or Stroke/TIA/TE]  2. Long QT syndrome  3. Hypertension:  Current medicines are reviewed at length with the patient today.   The patient {ACTIONS; HAS/DOES NOT HAVE:19233} concerns regarding her medicines.  The following changes were made today:  {NONE DEFAULTED:18576::"none"}  Labs/ tests ordered today include: *** No orders of the defined types were placed in this encounter.    Disposition:   FU with *** {gen number [id number]} {Days to years:10300}  Signed, Hiroto Saltzman Jorja LoaMartin Hulan Szumski, MD  07/23/2016 7:03 AM     [facility] [address] [place] [address] [phone] (office) [phone] (fax)

## 2016-07-24 ENCOUNTER — Encounter: Payer: Self-pay | Admitting: Cardiology

## 2016-07-24 ENCOUNTER — Encounter: Payer: Self-pay | Admitting: Physician Assistant

## 2016-08-07 ENCOUNTER — Encounter: Payer: Self-pay | Admitting: Family Medicine

## 2016-08-12 ENCOUNTER — Encounter: Payer: Self-pay | Admitting: Cardiology

## 2016-10-03 ENCOUNTER — Telehealth: Payer: Self-pay | Admitting: Physician Assistant

## 2016-10-03 ENCOUNTER — Encounter: Payer: Self-pay | Admitting: Family Medicine

## 2016-10-03 NOTE — Progress Notes (Signed)
After hour phone line called yesterday evening.  Patient was found deceased in her home yesterday evening by EMS.

## 2016-10-03 NOTE — Telephone Encounter (Signed)
A representative of [employer] (name Jonny RuizJohn or Ines BloomerShawn) called requesting fax information for pts death certificate. Provided fax [phone]. Was advised that a fax will be sent on Monday and that he would be by at a later time with the origional death certificate to be signed.

## 2016-10-23 DEATH — deceased

## 2017-05-28 ENCOUNTER — Other Ambulatory Visit: Payer: Self-pay | Admitting: Cardiovascular Disease

## 2017-08-25 IMAGING — CT CT ABD-PELV W/ CM
2 of 5 series · 16 of 46 positions shown, 18 images · IV contrast (APPLIED)
Comparison: None. Chest CT from 03/26/2014 is not currently
retrievable from the archive.

CLINICAL DATA: Sudden onset lower abdominal pain radiating to the
back.

EXAM:
CT ABDOMEN AND PELVIS WITH CONTRAST
TECHNIQUE: Multidetector CT imaging of the abdomen and pelvis was performed
using the standard protocol following bolus administration of
intravenous contrast.
CONTRAST:  80mL OMNIPAQUE IOHEXOL 300 MG/ML  SOLN

[Series 2: abd/ pelvis 5.0 i30f 1 · axial · 0.74mm/px · z∈[-439,-4]mm · 13 of 99 slices shown, 15 images]
[im 6/99  soft-tissue]
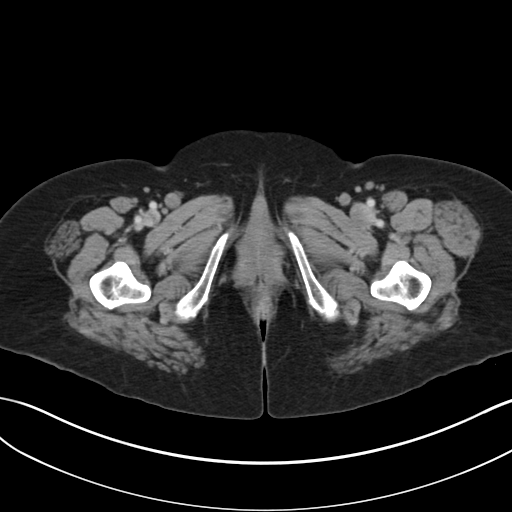
[im 6/99  bone]
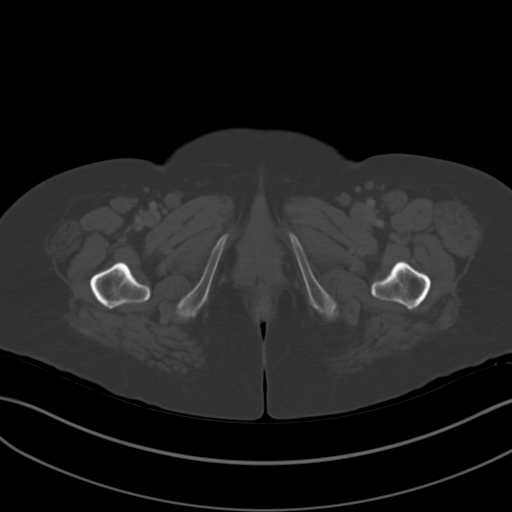
[im 11/99  soft-tissue]
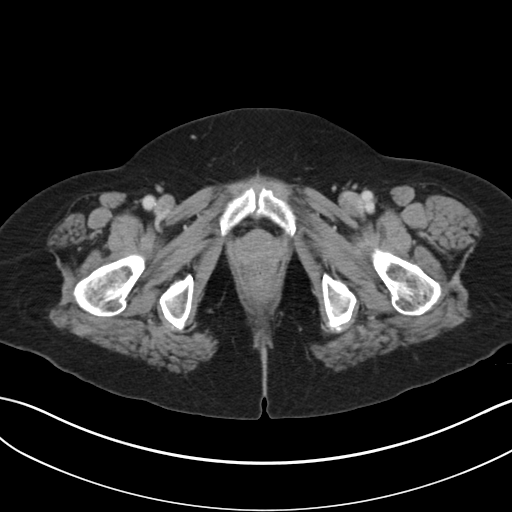
[im 22/99  soft-tissue]
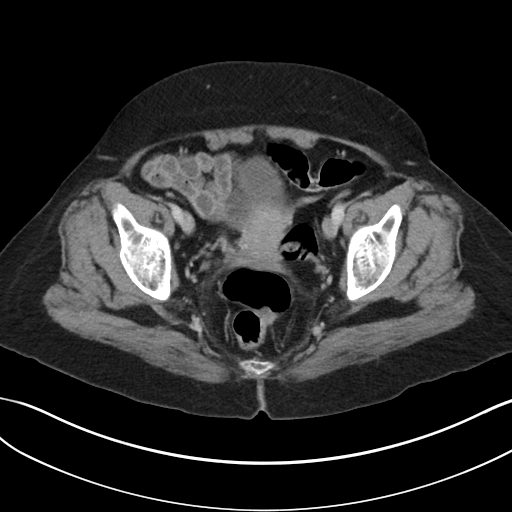
[im 28/99  soft-tissue]
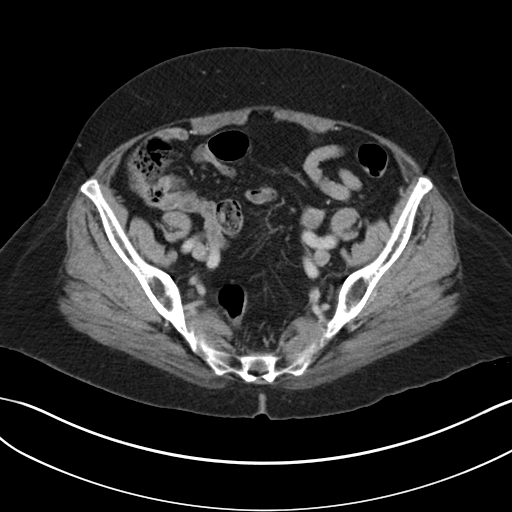
[im 33/99  soft-tissue]
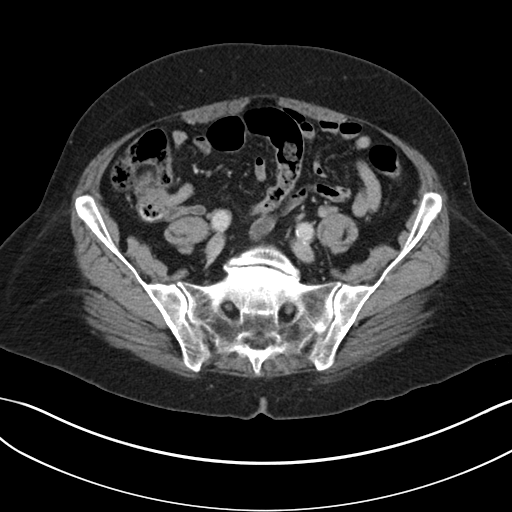
[im 44/99  soft-tissue]
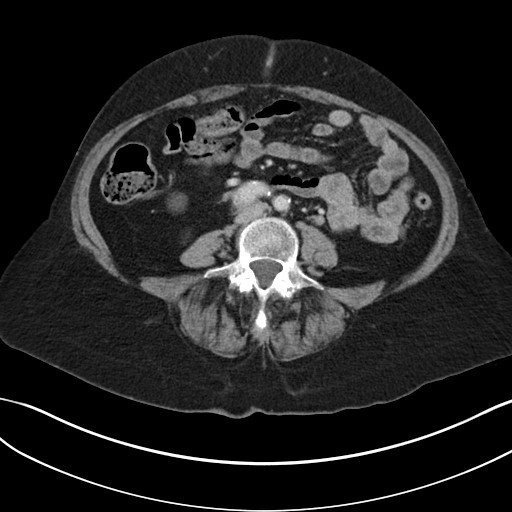
[im 50/99  soft-tissue]
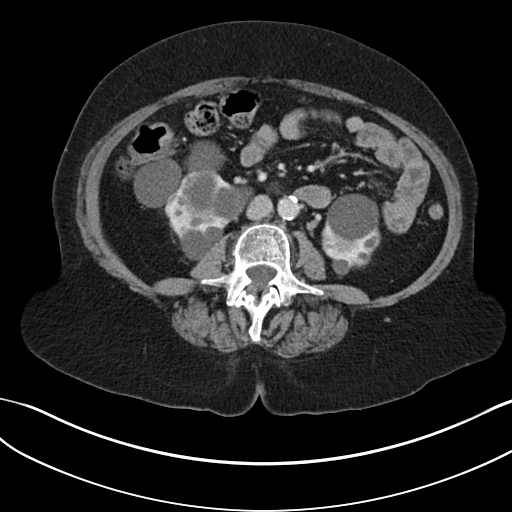
[im 55/99  soft-tissue]
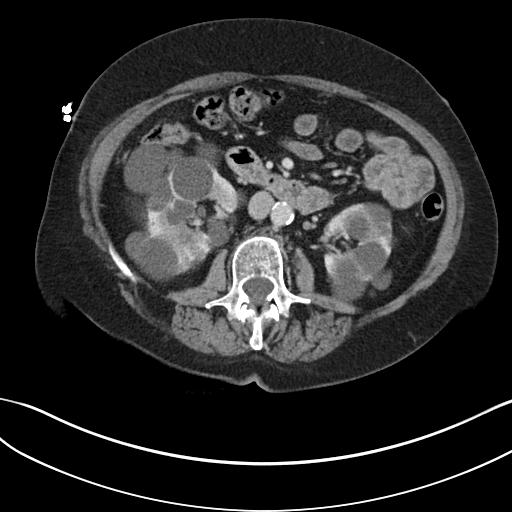
[im 66/99  soft-tissue]
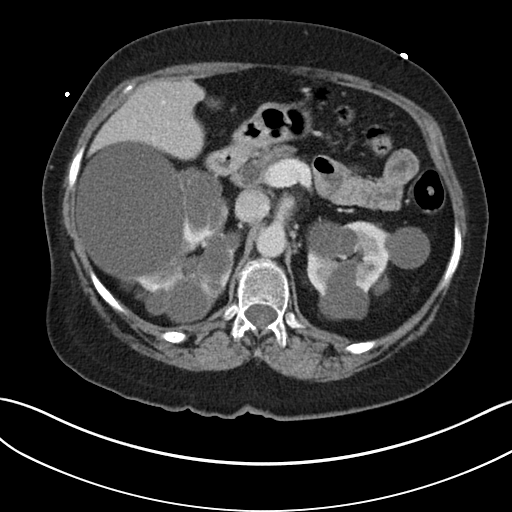
[im 66/99  bone]
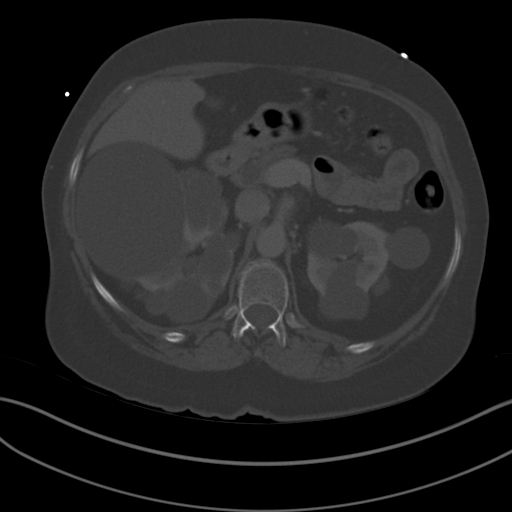
[im 71/99  soft-tissue]
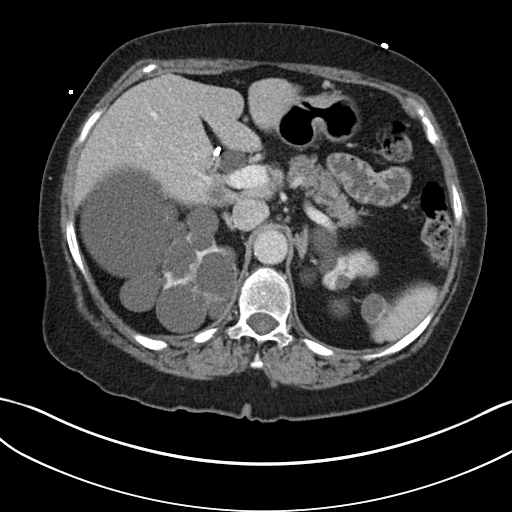
[im 77/99  soft-tissue]
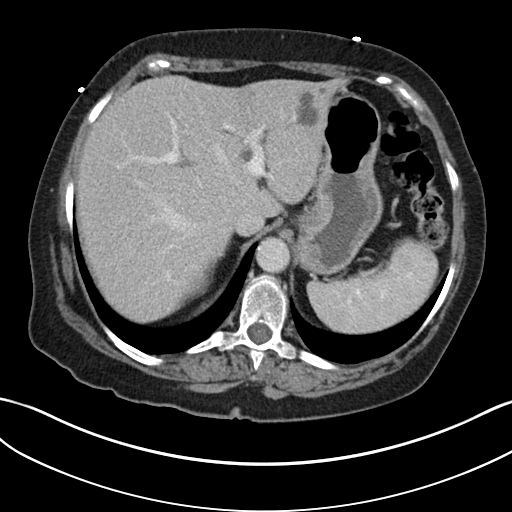
[im 88/99  soft-tissue]
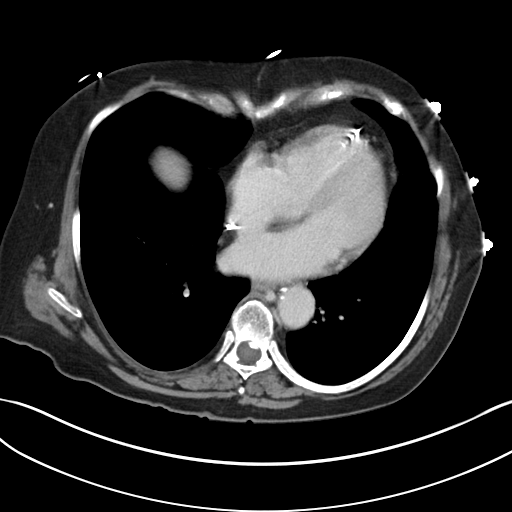
[im 93/99  soft-tissue]
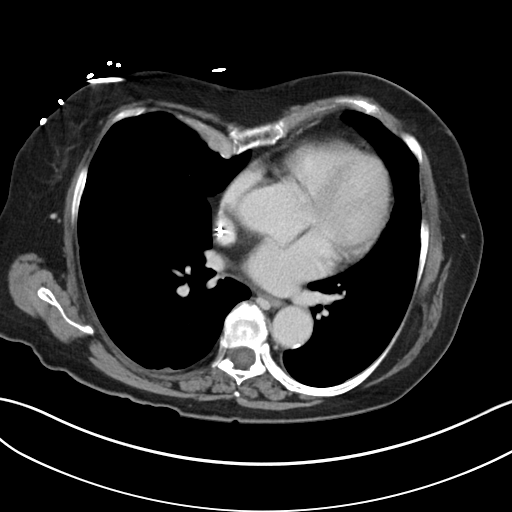

[Series 5: coronal soft tissue · coronal · 0.78mm/px · 3 of 89 slices shown]
[im 30/89  soft-tissue]
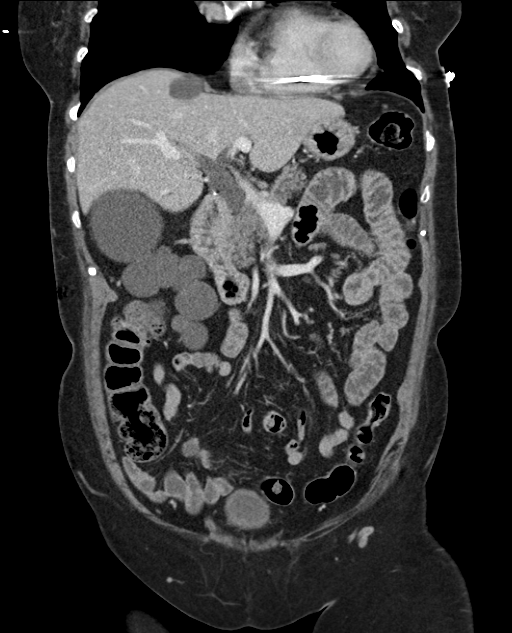
[im 40/89  soft-tissue]
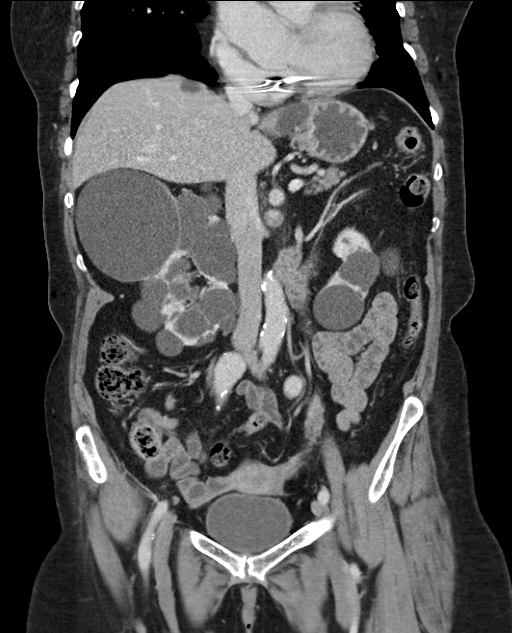
[im 49/89  soft-tissue]
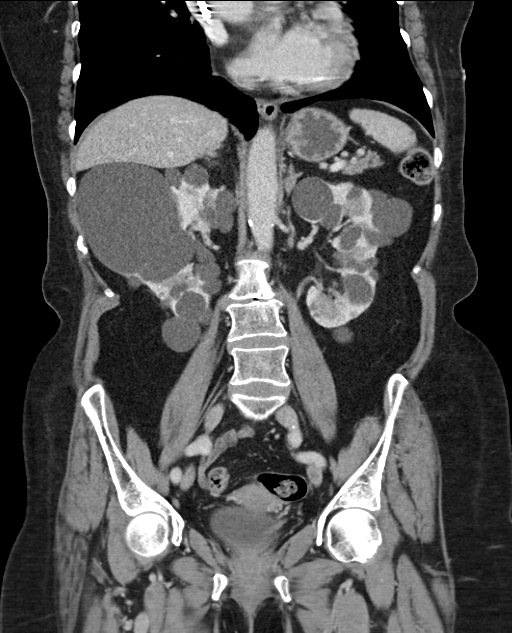

[16 of 46 positions shown; findings below may reference images not displayed]

FINDINGS: Lower chest and abdominal wall: No acute finding. Single chamber
ICD/ pacer lead into the right ventricular apex.

Hepatobiliary: Multiple benign hepatic cysts, including 37 mm cyst
in the high central liver.Cholecystectomy with dilated common bile
duct at 11 mm, likely reservoir effect given liver function tests.

Pancreas: Unremarkable.

Spleen: Incidental cysts or pseudocysts.  No significant finding.

Adrenals/Urinary Tract: Negative adrenals. Numerous renal cysts
without mural thickening or internal density to suggest superimposed
hemorrhage or infection as cause of symptoms. No hydronephrosis or
stone. Unremarkable bladder.

Reproductive:No pathologic findings.

Stomach/Bowel: No obstruction. No pericecal inflammation.
Appendectomy.

Vascular/Lymphatic: Moderate to high-grade proximal SMA stenosis
from low-density eccentric plaque. Downstream vessel is fusiformly
dilated with additional anterior low-density plaque causing mild
stenosis. No major branch occlusion or dissection flap to explain
pain. Fusiform 2 cm right common iliac artery aneurysm. No mass or
adenopathy.

Peritoneal: No ascites or pneumoperitoneum.

Musculoskeletal: No acute finding. Chronic pars defects at L5 with
grade 2 anterolisthesis and biforaminal stenosis. This is known from
MRI report 08/27/1998. Disc degeneration L5-S1 and L2-3 is advanced.
IMPRESSION: 1. No acute finding.
2. Polycystic kidney disease without complicated cyst to explain
pain.
3. Extensive atherosclerosis with moderate to high-grade proximal
SMA stenosis and 2 cm right common iliac artery aneurysm.
4. Incidental findings noted above.

## 2018-05-09 IMAGING — CR DG CHEST 2V
2 series · 2 of 2 positions shown · non-contrast
Comparison: Inches radiograph 11/16/2015.

CLINICAL DATA: Patient with shortness of breath.

EXAM:
CHEST  2 VIEW

[w chest pa]
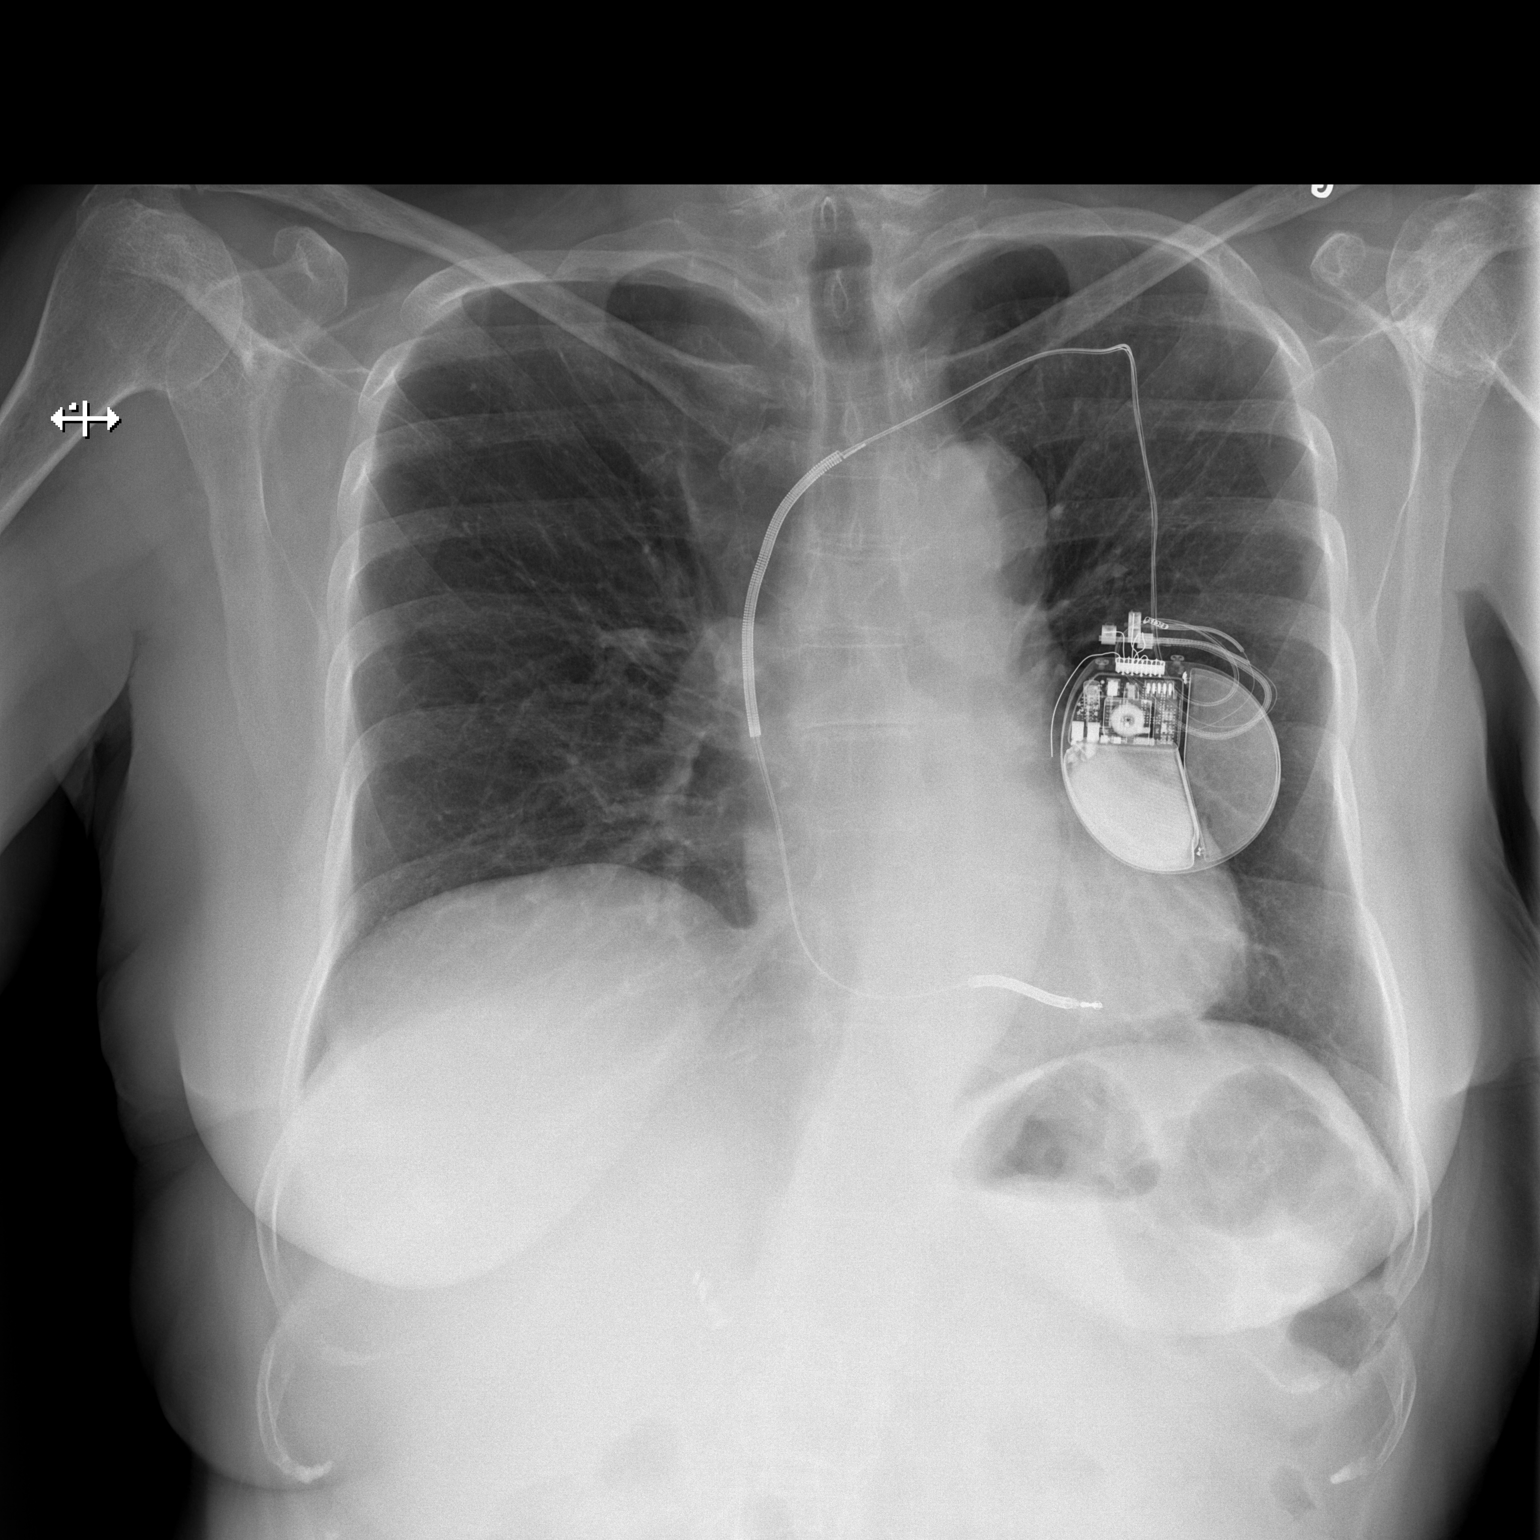

[w chest lat]
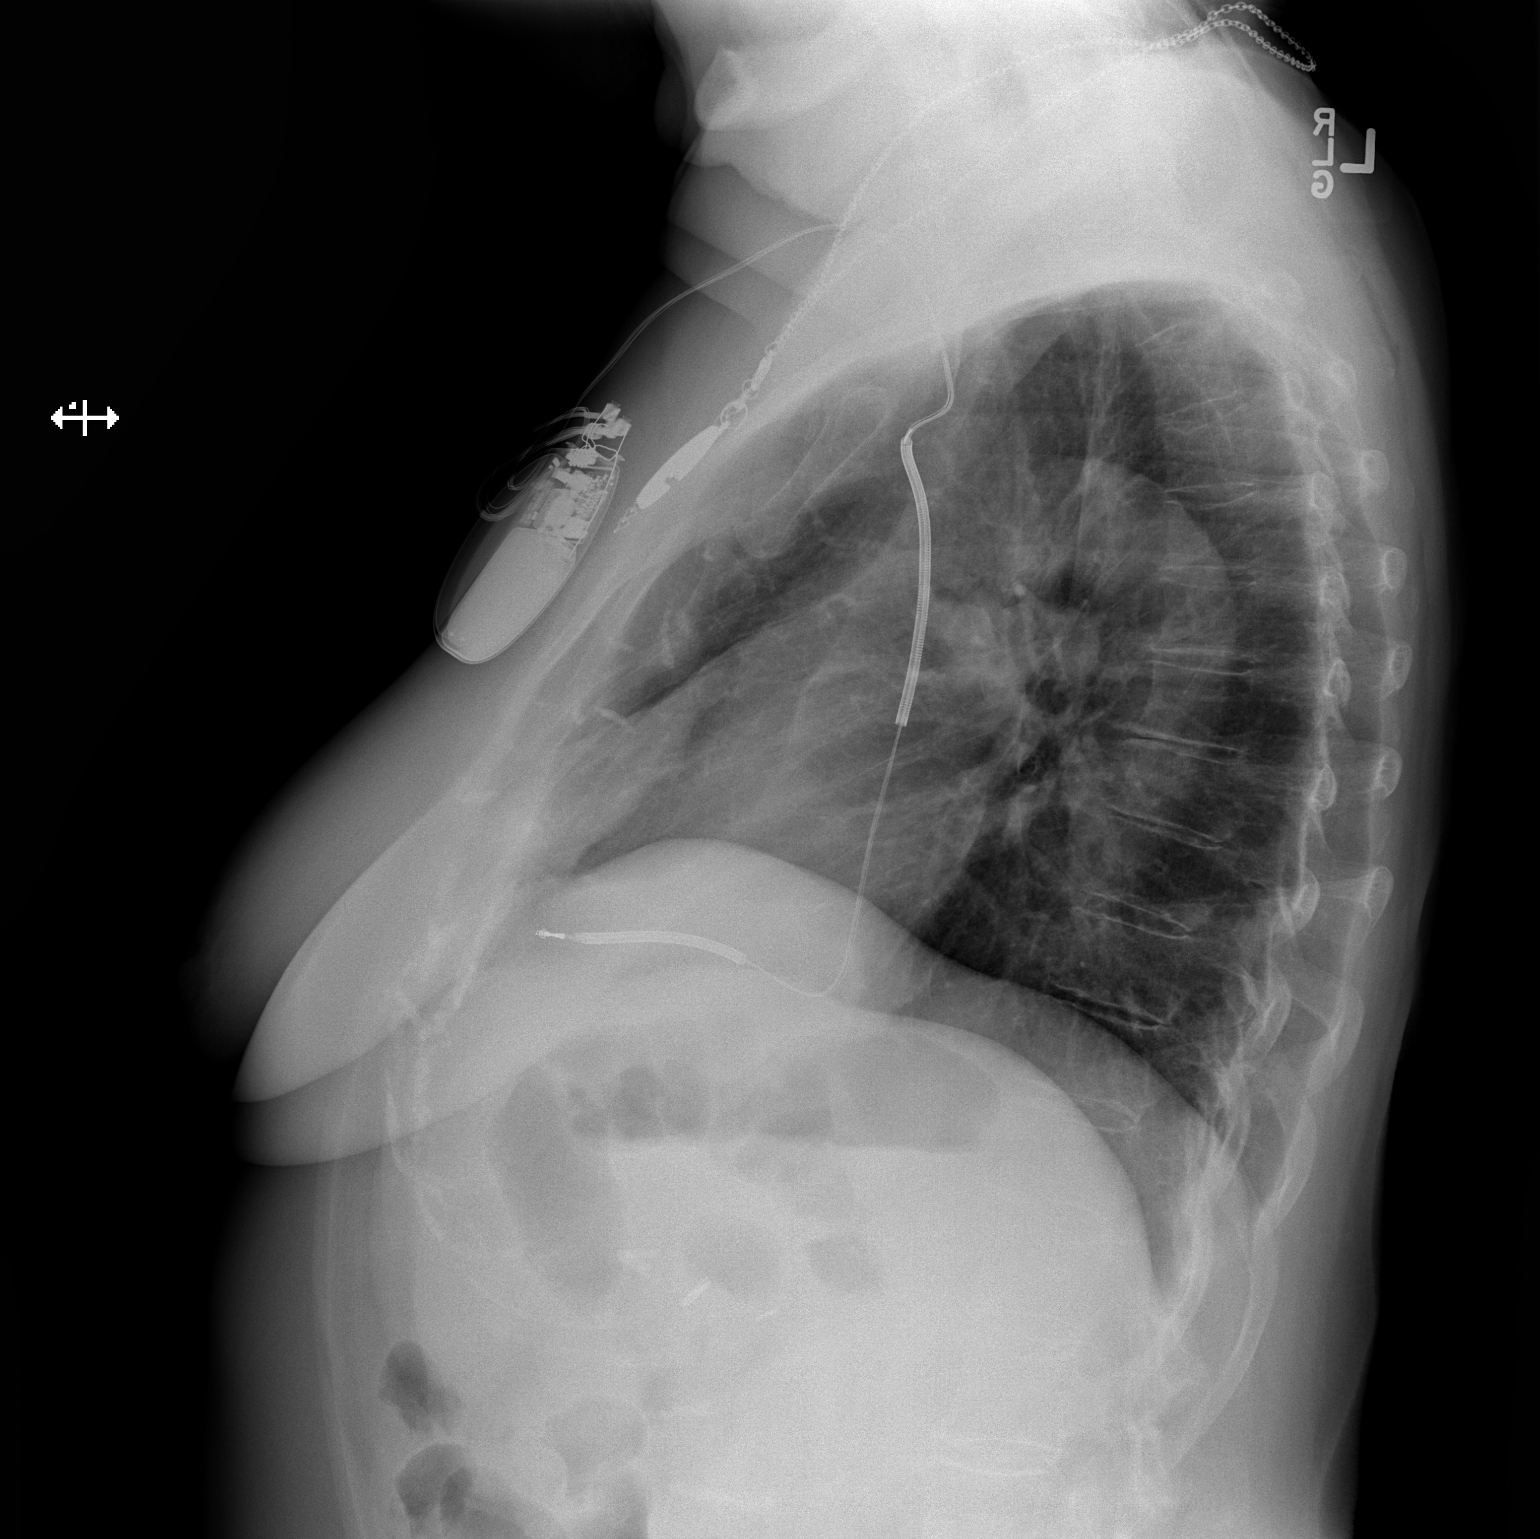

[2 of 2 positions shown; findings below may reference images not displayed]

FINDINGS: Single lead AICD device overlies the left hemi thorax, lead is
stable in position. Stable cardiac and mediastinal contours. Mild
elevation right hemidiaphragm. No consolidative pulmonary opacities.
No pleural effusion or pneumothorax. Thoracic spine degenerative
changes.
IMPRESSION: No active cardiopulmonary disease.
